# Patient Record
Sex: Male | Born: 1950 | Race: Black or African American | Hispanic: No | Marital: Married | Smoking: Former smoker
Health system: Southern US, Community
[De-identification: ages and names within clinical notes are randomized; demographics above are authoritative.]

## PROBLEM LIST (undated history)

## (undated) ENCOUNTER — Ambulatory Visit: Payer: Medicare Other | Source: Home / Self Care

## (undated) DIAGNOSIS — N400 Enlarged prostate without lower urinary tract symptoms: Secondary | ICD-10-CM

## (undated) DIAGNOSIS — K219 Gastro-esophageal reflux disease without esophagitis: Secondary | ICD-10-CM

## (undated) DIAGNOSIS — I10 Essential (primary) hypertension: Secondary | ICD-10-CM

## (undated) DIAGNOSIS — J302 Other seasonal allergic rhinitis: Secondary | ICD-10-CM

## (undated) DIAGNOSIS — M1990 Unspecified osteoarthritis, unspecified site: Secondary | ICD-10-CM

## (undated) HISTORY — PX: OTHER SURGICAL HISTORY: SHX169

## (undated) HISTORY — PX: COLONOSCOPY: SHX174

---

## 1998-10-05 ENCOUNTER — Ambulatory Visit (HOSPITAL_COMMUNITY): Admission: RE | Admit: 1998-10-05 | Discharge: 1998-10-05 | Payer: Self-pay | Admitting: Gastroenterology

## 2000-06-06 ENCOUNTER — Ambulatory Visit (HOSPITAL_COMMUNITY): Admission: RE | Admit: 2000-06-06 | Discharge: 2000-06-06 | Payer: Self-pay | Admitting: Gastroenterology

## 2003-04-12 HISTORY — PX: JOINT REPLACEMENT: SHX530

## 2003-06-10 ENCOUNTER — Inpatient Hospital Stay (HOSPITAL_COMMUNITY): Admission: RE | Admit: 2003-06-10 | Discharge: 2003-06-14 | Payer: Self-pay | Admitting: Orthopedic Surgery

## 2003-11-11 ENCOUNTER — Encounter: Admission: RE | Admit: 2003-11-11 | Discharge: 2003-11-11 | Payer: Self-pay | Admitting: Internal Medicine

## 2004-05-04 ENCOUNTER — Ambulatory Visit (HOSPITAL_COMMUNITY): Admission: RE | Admit: 2004-05-04 | Discharge: 2004-05-04 | Payer: Self-pay | Admitting: Gastroenterology

## 2005-04-20 ENCOUNTER — Encounter: Admission: RE | Admit: 2005-04-20 | Discharge: 2005-04-20 | Payer: Self-pay | Admitting: Orthopedic Surgery

## 2007-01-28 ENCOUNTER — Emergency Department (HOSPITAL_COMMUNITY): Admission: EM | Admit: 2007-01-28 | Discharge: 2007-01-29 | Payer: Self-pay | Admitting: Emergency Medicine

## 2007-08-03 ENCOUNTER — Encounter: Admission: RE | Admit: 2007-08-03 | Discharge: 2007-08-03 | Payer: Self-pay | Admitting: Gastroenterology

## 2008-10-09 HISTORY — PX: BACK SURGERY: SHX140

## 2008-11-04 ENCOUNTER — Inpatient Hospital Stay (HOSPITAL_COMMUNITY): Admission: RE | Admit: 2008-11-04 | Discharge: 2008-11-06 | Payer: Self-pay | Admitting: Neurosurgery

## 2010-07-18 LAB — CBC
Platelets: 186 10*3/uL (ref 150–400)
RDW: 13.3 % (ref 11.5–15.5)

## 2010-07-18 LAB — BASIC METABOLIC PANEL
BUN: 19 mg/dL (ref 6–23)
Calcium: 9.5 mg/dL (ref 8.4–10.5)
Creatinine, Ser: 0.91 mg/dL (ref 0.4–1.5)
GFR calc non Af Amer: >60 mL/min (ref >60)
Glucose, Bld: 109 mg/dL — ABNORMAL HIGH (ref 70–99)

## 2010-07-26 ENCOUNTER — Ambulatory Visit: Payer: BLUE CROSS/BLUE SHIELD | Admitting: Physical Medicine & Rehabilitation

## 2010-07-27 ENCOUNTER — Ambulatory Visit: Payer: Self-pay | Admitting: Physical Medicine & Rehabilitation

## 2010-08-24 NOTE — Op Note (Signed)
NAMEMAKAIO, MACH NO.:  [id number]   MEDICAL RECORD NO.:  [id number]          PATIENT TYPE:  INP   LOCATION:  3172                         FACILITY:  [facility]   PHYSICIAN:  Danae Orleans. Venetia Maxon, M.D.  DATE OF BIRTH:  May 17, 1950   DATE OF PROCEDURE:  11/04/2008  DATE OF DISCHARGE:                               OPERATIVE REPORT   PREOPERATIVE DIAGNOSES:  Bilateral L3-L4, L4-L5, and L5-S1 stenosis with  spondylolisthesis of L4-L5, spondylosis, degenerative disk disease, and  radiculopathy.   POSTOPERATIVE DIAGNOSES:  Bilateral L3-L4, L4-L5, and L5-S1 stenosis  with spondylolisthesis of L4-L5, spondylosis, degenerative disk disease,  and radiculopathy.   PROCEDURES:  1. Bilateral laminar foraminotomies L3-L4, L4-L5, and L5-S1.  2. Microdissection.  3. Baxano decompression using intraoperative fluoroscopy.   SURGEON:  Danae Orleans. Venetia Maxon, MD   ASSISTANT:  Clydene Fake, MD   ANESTHESIA:  General endotracheal anesthesia.   ESTIMATED BLOOD LOSS:  300 mL.   COMPLICATIONS:  None.   DISPOSITION:  Recovery.   INDICATIONS:  Daschel Roughton is a 60 year old man who works for [employer] has  significant bilateral foraminal stenosis at L3-L4, L4-L5, and L5-S1  levels with spondylolisthesis of L4-L5.  It was elected to take him to  Surgery for decompression at these affected levels.   PROCEDURE IN DETAIL:  Mr. Womac was brought to the operating room.  Following satisfactory and uncomplicated induction of general  endotracheal anesthesia and placement of intravenous lines and Foley  catheter, the patient was placed in a prone position on the operating  table after neural monitoring was established over the L2-L3 dermatomes  for EMG monitoring.  The patient's back was shaved and prepped and  draped in the usual sterile fashion.  The area of planned incision was  infiltrated with local lidocaine.  Incision was made, carried to the  lumbodorsal fascia which was incised  bilaterally sharply and  subperiosteal dissection was performed exposing L3-L4, L4-L5, and L5-S1  interlaminar spaces.  Intraoperative x-ray with fluoroscopy confirmed  correct level.  A Versa-Trac retractor was placed to facilitate  exposure.  Soft tissues were cleared overlying the laminae of L4, L5,  and L3 and bilateral foraminotomies were performed at L3-L4, L4-L5, and  L5-S1.  Subsequently using the Baxano decompression technique with iO-  Flex, a disk level pass was performed at L4-L5 and L5-S1 on the left and  shaving was performed to open this level.  Approximately 25  versifications were performed with good decompression.  An attempt was  made to perform decompression of the L3-L4 level but with the neural  monitoring there was neural irritability and it was elected to stop.  At  the other levels, there was good neural monitoring and I went ahead with  decompression.  At the L4-L5 level on the right, I had no difficulty  passing the probe with good monitoring results and a 10-mm shaver was  utilized.  On the right side at L3-L4, there was neural irritability and  again it was elected to discontinue the Baxano decompression at this  level.  At L5-S1 level, I did not get nerve  root identification either  with stimulation above or below and it was therefore elected to stop the  decompression procedure.  Microscope was brought into the field and all  neural elements were inspected and felt to be well decompressed and the  wound was then irrigated.  Self-retaining retractors were removed.  The  lumbodorsal fascia was closed with 0-Vicryl sutures, subcutaneous  tissues were reapproximated with 2-0 Vicryl interrupted inverted  sutures, and the skin edges were reapproximated with interrupted 3-0  Vicryl subcuticular stitch.  Wound was dressed with Dermabond.  The  patient was extubated in the operating room and taken to the recovery  room in stable and satisfactory condition having  tolerated his operation  well.  Counts were correct at the end of case.      Danae Orleans. Venetia Maxon, M.D.  Electronically Signed     JDS/MEDQ  D:  11/04/2008  T:  11/05/2008  Job:  [id number]

## 2010-08-27 NOTE — Procedures (Signed)
[place]  Patient:    Raymond Graham, Raymond Graham                       MRN: [id number] Proc. Date: 06/06/00 Adm. Date:  [id number] Attending:  Louie Bun                           Procedure Report  PROCEDURE:  Esophagogastroduodenoscopy.  INDICATION FOR PROCEDURE:  Black, heme-positive stools.  DESCRIPTION OF PROCEDURE:  The patient was placed in the left lateral decubitus position and placed on the pulse monitor with continuous low-flow oxygen delivered by nasal cannula.  He was sedated with 40 mg IV Demerol and 4 mg IV Versed.  The Olympus video endoscope was advanced under direct vision into the oropharynx and esophagus.  The esophagus was straight and of normal caliber.  The squamocolumnar line was at 36 cm.  There was a widely patent lower esophageal ring and a 2 cm hiatal hernia distal to it.  There was no other abnormality of the distal esophagus or GE junction.  The stomach was entered, and a small amount of liquid secretions was suctioned from the fundus.  Retroflexed view of the cardia confirmed the hiatal hernia and was otherwise unremarkable.  The fundus, body, and antrum appeared normal.  The pylorus was somewhat dilated and slightly deformed, and there was a clean-based 6-7 mm ulcer at the pyloric inlet.  There was no visible vessel or other stigma of hemorrhage associated with this.  The duodenal bulb appeared normal, as did the postbulbar duodenum.  The scope was withdrawn back into the stomach and a CLOtest obtained.  The scope was then withdrawn and the patient returned to the recovery room in stable condition.  He tolerated the procedure well, and there were no immediate complications.  IMPRESSION:  Pyloric channel ulcer with no current stigma of hemorrhage.  PLAN:  Treat with proton pump inhibitor and await CLOtest and treat for eradication of Helicobacter if positive. DD:  06/06/00 TD:  06/06/00 Job: [id number]

## 2010-08-27 NOTE — Op Note (Signed)
NAME:  Raymond Graham, Raymond Graham              ACCOUNT NO.:  [id number]   MEDICAL RECORD NO.:  [id number]          PATIENT TYPE:  AMB   LOCATION:  ENDO                         FACILITY[facility]   PHYSICIAN:  John C. Madilyn Fireman, M.D.    DATE OF BIRTH:  06/04/1950   DATE OF PROCEDURE:  05/04/2004  DATE OF DISCHARGE:                                 OPERATIVE REPORT   PROCEDURE:  Colonoscopy.   SURGEON:   INDICATIONS FOR PROCEDURE:  History of adenomatous colon polyps.   DESCRIPTION OF PROCEDURE:  The patient was placed in the left lateral  decubitus position and placed on the pulse monitor with continuous low flow  oxygen delivered by nasal cannula.  He was sedated with 62.5 mcg IV Fentanyl  and 7 mg IV Versed.  The Olympus video colonoscope was into the rectum and  advanced to the cecum, confirmed by transillumination of McBurney's point  and visualization of the ileocecal valve and appendiceal orifice.  The prep  was good.  The cecum, ascending and transverse colon all appeared normal  with no masses, polyps, diverticula or other mucosal abnormalities.  Within  the descending and sigmoid colon, there was seen a few scattered diverticula  and no other abnormalities.  The rectum appeared normal.  On retroflexed  view, the anus revealed no obvious internal hemorrhoids.  Scope was then  withdrawn and the patient returned to the recovery room in stable condition.  He tolerated the procedure well and there were no immediate complications.   IMPRESSION:  Diverticulosis.  Otherwise normal study.   PLAN:  Repeat colonoscopy in five years.      JCH/MEDQ  D:  05/04/2004  T:  05/04/2004  Job:  [id number]   cc:   Windle Guard, M.D.  [address]  [place], [place] [address]  Fax: [phone]

## 2010-08-27 NOTE — H&P (Signed)
NAME:  Raymond Graham, Raymond Graham                  ACCOUNT NO.:  [id number]   MEDICAL RECORD NO.:  [id number]                   PATIENT TYPE:  INP   LOCATION:  NA                                   FACILITY:  [facility]   PHYSICIAN:  Madlyn Frankel. Charlann Boxer, M.D.               DATE OF BIRTH:  1951-03-11   DATE OF ADMISSION:  06/10/2003  DATE OF DISCHARGE:                                HISTORY & PHYSICAL   CHIEF COMPLAINT:  Right hip pain.   HISTORY OF PRESENT ILLNESS:  The patient is a 60 year old male who has about  a 1-1/2 year history of right hip pain.  He states that he has had an  increased amount of pain when sitting for a long period, and states that it  is hard to get up.  He also is complaining of some groin pain in the right  hip.  Upon reviewing the x-rays, x-rays revealed bone-on-bone arthritis of  the right hip with cystic changes and osteophyte formation.  With the  radiographic evidence, Dr. Charlann Boxer feels it is best to proceed with a right  total hip arthroplasty.  The patient agrees.  Risks and benefits of the  surgery have been discussed with the patient, and the patient wishes to  proceed.   PAST MEDICAL HISTORY:  1. Hypertension.  2. Gastroesophageal reflux disease.   PAST SURGICAL HISTORY:  1. Ganglion cyst removal, left wrist.  2. Bunion removal, left foot.   MEDICATIONS:  1. Prevacid 30 mg one p.o. daily.  2. Ziac 10/6.25 mg one p.o. daily.  3. Celebrex 400 mg one p.o. daily which he has been instructed to stop     taking prior to surgery.  4. Lisinopril 10 mg one p.o. daily.  5. Clarinex p.r.n.   ALLERGIES:  No known drug allergies.   SOCIAL HISTORY:  The patient denies any tobacco or alcohol use.  He lives in  a [place] house with 16 steps in the house.  He is married.   FAMILY HISTORY:  Mother with hypertension.  Father with colon cancer.   REVIEW OF SYSTEMS:  GENERAL:  Denies fever, chills, night sweats, bleeding  tendencies.  CNS:  Denies blurry or double  vision, seizures, headaches,  paralysis.  RESPIRATORY:  Denies shortness of breath, productive cough,  hemoptysis.  CARDIOVASCULAR:  Denies chest pain, angina, orthopnea.  GASTROINTESTINAL:  Denies nausea, vomiting, diarrhea, constipation, bloody  stools.  GENITOURINARY:  Denies dysuria, hematuria, or discharge.  MUSCULOSKELETAL:  Pertinent to history of present illness.   PHYSICAL EXAMINATION:  VITAL SIGNS:  Blood pressure 130/92, pulse 68,  respirations 12.  GENERAL:  Well-developed, well-nourished 60 year old male.  HEENT:  Normocephalic and atraumatic.  Pupils equal, round, and reactive to  light.  NECK:  Supple.  No carotid bruit noted.  CHEST:  Clear to auscultation bilaterally.  No wheezes or crackles.  HEART:  Regular rate and rhythm.  No murmurs, rubs, or gallops.  ABDOMEN:  Soft,  nontender, nondistended.  Positive bowel sounds x4.  EXTREMITIES:  Painful range of motion of the right hip.  He has 0 degrees of  internal rotation, 40 degrees of external rotation.  He is neurovascularly  intact distally.  SKIN:  No rashes or lesions.   X-ray reveals bone-on-bone osteoarthritis with cystic changes and osteophyte  formation of the right hip.   IMPRESSION:  1. Osteoarthritis of the right hip.  2. Hypertension.  3. Gastroesophageal reflux disease.   PLAN:  The patient will be admitted to [facility] on June 10, 2003, and will undergo a right total hip arthroplasty by Dr. Durene Romans.     Clarene Reamer, P.A.-C.                   Madlyn Frankel Charlann Boxer, M.D.    SW/MEDQ  D:  06/09/2003  T:  06/10/2003  Job:  [id number]

## 2010-08-27 NOTE — Op Note (Signed)
NAME:  DHRUVA, ORNDOFF                  ACCOUNT NO.:  [id number]   MEDICAL RECORD NO.:  [id number]                   PATIENT TYPE:  INP   LOCATION:  0005                                 FACILITY:  [facility]   PHYSICIAN:  Madlyn Frankel. Charlann Boxer, M.D.               DATE OF BIRTH:  11/07/1950   DATE OF PROCEDURE:  06/10/2003  DATE OF DISCHARGE:                                 OPERATIVE REPORT   PREOPERATIVE DIAGNOSES:  End-stage right hip osteoarthritis.   POSTOPERATIVE DIAGNOSES:  End-stage right hip osteoarthritis.   PROCEDURE:  Right total hip replacement.   COMPONENTS USED:  Depuy Esrom 18 x 13, 36 plus 8 stem, 49F XXL sleeve, 36  plus 3 metal ball, 54 mm pinnacle resector cup, two cancellous bone screws  and a neutral metal liner.   SURGEON:  Madlyn Frankel. Charlann Boxer, M.D.   ASSISTANT:  Clarene Reamer, P.A.-C.   ANESTHESIA:  General.   ESTIMATED BLOOD LOSS:  700 mL   DRAINS:  Drains x1.   IV FLUIDS:  2 units of FFP for a preoperative INR of 1.7.   COMPLICATIONS:  None apparent.   DISPOSITION:  Stable to recovery room.   INDICATIONS FOR PROCEDURE:  Mr. Bowlby is a pleasant 60 year old black  male with a longstanding history of right hip pain. He had been followed  conservatively over a period of a year and a half and at this point felt  that his symptoms were decreasing his quality of life and the ability to  function normally.  We discussed the risks and benefits of right total hip  replacement and he consents for this procedure.   DESCRIPTION OF PROCEDURE:  The patient was brought to the operative theatre.  Once adequate anesthesia and preoperative antibiotics totaling 1 g of Ancef  was administered, the patient was positioned in the left lateral decubitus  position with the right side up.  A curvilinear lateral based incision was  made for posterior approach to the hip.  Sharp dissection was taken down to  the iliotibial band and gluteus fascia which was incised in line with  the  incision.  Sharp dissection was carried down with Bovie and short external  rotators taken down separate from the posterior capsule.  Capsulotomy was  made, hip dislocated and neck osteotomy made based off of anatomical  landmarks and preoperative templating.  Following this, attention was  directed to the acetabulum. Following exposure, reaming commenced with a 47  reamer and was carried down to the medial wall.  Subsequent reaming was  carried up to a 53 reamer with excellent fit. A final 54 cup was then  impacted with excellent Press-Fit through and two cancellous bone screws  were placed. The cup's position was noted to be about 40-45 degrees of  abduction in this male with about 20 degrees of forward flexion which  appeared to be anatomic within a couple millimeters of the anterior rim.  Following this, the final  36 neutral metal liner was impacted into position.  At this point, attention was directed to the femoral preparation.   The femur was prepared per Esrom protocol, straight reaming was carried up  to a 13 mm with a 13.5 mm passed 3/4 of the way down.  Proximal reaming was  carried up to an 69F followed by milling to an XXL.  Trial reduction was  carried out.  Note that the trial sleeve was placed in about 10 degrees of  anteversion compared to the tibia.  An additional 20 degrees was added in  for trial reduction and given a combined forward anteversion of the femur  about 30 degrees. Trial reduction was carried out initially with a 36 mm  ball. With this amount of anteversion, the hip was stable to 80 degrees of  flexion and neutral abduction, internal rotation to about 80 degrees was  stable in sleep position.  There was a slight rock in the component with  internal rotation.  For this reason, we trialed a 36 plus 3 ball. The hip  seemed to be more stable. Leg lengths did not appear to be affected with  this. The patient is noted to have a significant bilateral genu  verum and a  moderate amount of __________left hip.  A decision was made to keep the 36  plus 3 ball in order to provide the stability.  Anterior superior capsular  tissue which could result in some impingement as well as some tightness  anteriorly was debrided. The hip remained stable. The combined anteversion  was noted to be about 45 degrees.  There was no evidence of impingement on  external rotation.  The patient's hip was tightened in extension but was  noted to be that way preoperatively secondary to the degenerative changes.   Following preparation, the femur and trial components removed, final  components were brought to the field. The 69F XXL sleeve was impacted into  position at the level of the neck cut followed by placement of the final 18,  13, 36 plus 8 stem with 20 degrees of anteversion.  The final 36 plus 8 ball  was then impacted onto a dried trunnions and the hip reduced. The hip was  stable.  The posterior capsular tissues that were saved were then  reapproximated to the posterior trochanter through drill holes.  The hip was  copiously irrigated with normal saline solution with antibiotic solution  throughout the case and at this point as well.  A medium Hemovac drain was  placed deep.  The iliotibial band was reapproximated using #1 Ethibond. The  gluteus fascia was reapproximated using a #1 Vicryl.  The remainder of the  wound was closed in layers.  The hip wound was then cleaned, dried and  dressed sterilely with Steri-Strips, dressing, sponge and tape. The patient  tolerated the procedure without complications and was extubated and  transported to the recovery room in stable condition.                                               Madlyn Frankel Charlann Boxer, M.D.    MDO/MEDQ  D:  06/10/2003  T:  06/10/2003  Job:  [id number]

## 2011-01-19 LAB — BASIC METABOLIC PANEL
GFR calc non Af Amer: >60
Potassium: 3.2 — ABNORMAL LOW
Sodium: 136

## 2011-01-19 LAB — DIFFERENTIAL
Eosinophils Relative: 0
Lymphocytes Relative: 9 — ABNORMAL LOW
Lymphs Abs: 0.8
Monocytes Absolute: 0.4
Neutro Abs: 8.1 — ABNORMAL HIGH

## 2011-01-19 LAB — POCT CARDIAC MARKERS
CKMB, poc: 1.8
Operator id: 2725
Operator id: 2725
Troponin i, poc: <0.05
Troponin i, poc: <0.05

## 2011-01-19 LAB — CBC
HCT: 43.3
Hemoglobin: 14.8
Platelets: 210
WBC: 9.5

## 2011-05-23 ENCOUNTER — Encounter (HOSPITAL_COMMUNITY): Payer: Self-pay | Admitting: Pharmacy Technician

## 2011-05-25 ENCOUNTER — Encounter (HOSPITAL_COMMUNITY)
Admission: RE | Admit: 2011-05-25 | Discharge: 2011-05-25 | Disposition: A | Discharge status: Home / Self Care | Payer: BC Managed Care – PPO | Source: Ambulatory Visit | Attending: Orthopedic Surgery | Admitting: Orthopedic Surgery

## 2011-05-25 ENCOUNTER — Encounter (HOSPITAL_COMMUNITY): Payer: Self-pay

## 2011-05-25 ENCOUNTER — Ambulatory Visit (HOSPITAL_COMMUNITY)
Admission: RE | Admit: 2011-05-25 | Discharge: 2011-05-25 | Disposition: A | Discharge status: Home / Self Care | Payer: BC Managed Care – PPO | Source: Ambulatory Visit | Attending: Orthopedic Surgery | Admitting: Orthopedic Surgery

## 2011-05-25 HISTORY — DX: Other seasonal allergic rhinitis: J30.2

## 2011-05-25 HISTORY — DX: Unspecified osteoarthritis, unspecified site: M19.90

## 2011-05-25 HISTORY — DX: Essential (primary) hypertension: I10

## 2011-05-25 HISTORY — DX: Benign prostatic hyperplasia without lower urinary tract symptoms: N40.0

## 2011-05-25 HISTORY — DX: Gastro-esophageal reflux disease without esophagitis: K21.9

## 2011-05-25 LAB — URINALYSIS, ROUTINE W REFLEX MICROSCOPIC
Glucose, UA: NEGATIVE mg/dL
Ketones, ur: NEGATIVE mg/dL
Leukocytes, UA: NEGATIVE
Nitrite: NEGATIVE
Specific Gravity, Urine: 1.021 (ref 1.005–1.030)
pH: 6 (ref 5.0–8.0)

## 2011-05-25 LAB — DIFFERENTIAL
Basophils Relative: 0 % (ref 0–1)
Eosinophils Absolute: 0.1 10*3/uL (ref 0.0–0.7)
Lymphs Abs: 1.3 10*3/uL (ref 0.7–4.0)
Monocytes Absolute: 0.7 10*3/uL (ref 0.1–1.0)
Monocytes Relative: 11 % (ref 3–12)

## 2011-05-25 LAB — CBC
HCT: 43.2 % (ref 39.0–52.0)
Hemoglobin: 15 g/dL (ref 13.0–17.0)
MCH: 29.6 pg (ref 26.0–34.0)
MCHC: 34.7 g/dL (ref 30.0–36.0)
MCV: 85.2 fL (ref 78.0–100.0)

## 2011-05-25 LAB — BASIC METABOLIC PANEL
BUN: 12 mg/dL (ref 6–23)
Chloride: 99 mEq/L (ref 96–112)
Creatinine, Ser: 0.96 mg/dL (ref 0.50–1.35)
GFR calc Af Amer: >90 mL/min (ref >90)
GFR calc non Af Amer: 87 mL/min — ABNORMAL LOW (ref >90)
Glucose, Bld: 108 mg/dL — ABNORMAL HIGH (ref 70–99)

## 2011-05-25 NOTE — Progress Notes (Signed)
H&P performed 05/25/11 Dictation # [id number]

## 2011-05-25 NOTE — Patient Instructions (Signed)
20 Raymond Graham  05/25/2011   Your procedure is scheduled on:  Tuesday 2/19  AT 11:45 AM  Report to [facility] at 9:30 AM.  Call this number if you have problems the morning of surgery: [phone]   Remember:   Do not eat food OF DRINK ANYTHING AFTER MIDNIGHT THE NIGHT BEFORE YOUR SURGERY.    Take these medicines the morning of surgery with A SIP OF WATER: METOPROLOL, TAMSULOSIN     -  MAY USE YOUR NASONEX   Do not wear jewelry  Do not wear lotions    Do not bring valuables to the hospital.  Contacts, dentures or bridgework may not be worn into surgery.  Leave suitcase in the car. After surgery it may be brought to your room.  For patients admitted to the hospital, checkout time is 11:00 AM the day of discharge.   Patients discharged the day of surgery will not be allowed to drive home.    Special Instructions: CHG Shower Use Special Wash: 1/2 bottle night before surgery and 1/2 bottle morning of surgery.   Please read over the following fact sheets that you were given: Blood Transfusion Information and MRSA Information AND INCENTIVE SPIROMETER INFORMATION

## 2011-05-25 NOTE — Pre-Procedure Instructions (Signed)
MEDICAL CLEARANCE ON CHART FROM DR. SANDERS. TREADMILL STRESS TEST REPORT WITH EKG 07/07/10,  AND EKG REPORT 05/13/11 AND CARDIAC CLEARANCE AND OFFICE NOTES FROM DR. GANJI ON THIS CHART. CXR WAS DONE TODAY PREOP AT [facility] PER ANESTHESIOLOGIST'S GUIDELINES.

## 2011-05-26 NOTE — H&P (Signed)
[name], Graham NO.:  [id number]  MEDICAL RECORD NO.:  [id number]  LOCATION:  PADM                         FACILITY[facility]  [facility]  PHYSICIAN:  Raymond Frankel. Charlann Graham, M.D.  DATE OF BIRTH:  1950-07-17  DATE OF ADMISSION:  05/25/2011 DATE OF DISCHARGE:  05/25/2011                             HISTORY & PHYSICAL   DATE OF SURGERY:  May 31, 2011.  ADMITTING DIAGNOSIS:  Left hip osteoarthritis.  HISTORY OF PRESENT ILLNESS:  This is a 61 year old gentleman with a history of osteoarthritis of his left hip that has failed conservative management.  He has had a previous right total hip arthroplasty in 2005, done quite well with it and is now scheduled for a total hip arthroplasty of the left hip by anterior approach.  The surgery, risks, benefits, and aftercare were discussed in detail with the patient, questions invited and answered.  Note, his medical doctor is Dr. Dorothyann Graham.  He is a candidate for tranexamic acid and dexamethasone, and will receive those in the preop.  He does plan on going home after surgery.  He is given his home medications of aspirin, Robaxin, iron, MiraLax, and Colace today.  PAST MEDICAL HISTORY:  Drug allergies, none.  Serious medical illnesses include hypertension, BPH, and allergies.  PREVIOUS SURGERIES:  Include right total hip arthroplasty, left wrist ganglion cyst removal, left foot bunionectomy, and spinal stenosis surgery for lumbar spinal stenosis by Dr. Venetia Graham in 2010.  CURRENT MEDICATIONS: 1. Losartan and hydrochlorothiazide 50/12.5 one  daily. 2. Metoprolol 100 mg 1 daily. 3. Tamsulosin 0.4 mg 1 daily. 4. Nasonex spray 50 mcg 2 sprays daily.  FAMILY HISTORY:  Positive for cancer and liver disease.  SOCIAL HISTORY:  The patient is married, he is a retired Loss adjuster, chartered.  He used to smoke, but does not any more; and drinks on occasion, but not daily.  He again plans to go home after surgery.  REVIEW OF SYSTEMS:  CENTRAL  NERVOUS SYSTEM:  Negative for headache, blurred vision, or dizziness.  PULMONARY:  Negative for shortness breath, PND, or orthopnea.  CARDIOVASCULAR:  Negative for chest pain or palpitation.  GI:  Negative for ulcers or hepatitis.  GU:  Negative for urinary tract difficulties other than BPH.  MUSCULOSKELETAL:  Positive in HPI.  PHYSICAL EXAMINATION:  VITAL SIGNS:  BP 145/90, respirations 14, pulse 68 and regular. GENERAL APPEARANCE:  This is a well-developed, well-nourished gentleman in no acute distress.  HEENT.  Head normocephalic.  Nose patent.  Ears patent.  Pupils are equal, round, react to light.  Throat without injection. NECK:  Supple without adenopathy.  Carotids are 2+ without bruit. CHEST:  Clear to auscultation.  No rales or rhonchi.  Respirations 14. HEART:  Regular rate and rhythm at 68 beats per minute without murmur. ABDOMEN:  Soft.  Active bowel sounds.  No masses or organomegaly. NEUROLOGIC:  The patient alert and oriented to time place, person. Cranial nerves II-XII grossly intact. EXTREMITIES:  Show the left hip to have markedly diminished range of motion with full extension and flexion to about 95 degrees, external rotation of 15 degrees, internal rotation to 0.  He also has significant varus deformity to his  left knee with 5 degrees from full extension, further flexion to 115 degrees.  Neurovascular status intact.  Dorsalis pedis, posterior tibialis pulses are 2+.  IMPRESSION:  End-stage osteoarthritis, left hip.  PLAN:  Total hip arthroplasty by anterior approach, left hip.     Raymond Graham, P.A.   ______________________________ Raymond Graham, M.D.    SJC/MEDQ  D:  05/25/2011  T:  05/26/2011  Job:  [id number]

## 2011-05-26 NOTE — Pre-Procedure Instructions (Signed)
CONNIE AT DR. Jeannetta Ellis OFFICE NOTIFIED PT HAS ABNORMAL LABS-PLEASE ASK DR. OLIN TO REVIEW THE ABNORMALS IN EPIC.

## 2011-05-31 ENCOUNTER — Inpatient Hospital Stay (HOSPITAL_COMMUNITY): Payer: BC Managed Care – PPO

## 2011-05-31 ENCOUNTER — Encounter (HOSPITAL_COMMUNITY): Payer: Self-pay | Admitting: Anesthesiology

## 2011-05-31 ENCOUNTER — Inpatient Hospital Stay (HOSPITAL_COMMUNITY)
Admission: RE | Admit: 2011-05-31 | Discharge: 2011-06-02 | DRG: 818 | Disposition: A | Discharge status: Home Health | Payer: BC Managed Care – PPO | Source: Ambulatory Visit | Attending: Orthopedic Surgery | Admitting: Orthopedic Surgery

## 2011-05-31 ENCOUNTER — Inpatient Hospital Stay (HOSPITAL_COMMUNITY): Payer: BC Managed Care – PPO | Admitting: Anesthesiology

## 2011-05-31 ENCOUNTER — Encounter (HOSPITAL_COMMUNITY)
Admission: RE | Disposition: A | Discharge status: Home Health | Payer: Self-pay | Source: Ambulatory Visit | Attending: Orthopedic Surgery

## 2011-05-31 ENCOUNTER — Encounter (HOSPITAL_COMMUNITY): Payer: Self-pay

## 2011-05-31 DIAGNOSIS — Z96649 Presence of unspecified artificial hip joint: Secondary | ICD-10-CM

## 2011-05-31 DIAGNOSIS — M169 Osteoarthritis of hip, unspecified: Principal | ICD-10-CM | POA: Diagnosis present

## 2011-05-31 DIAGNOSIS — M1610 Unilateral primary osteoarthritis, unspecified hip: Principal | ICD-10-CM | POA: Diagnosis present

## 2011-05-31 DIAGNOSIS — N400 Enlarged prostate without lower urinary tract symptoms: Secondary | ICD-10-CM | POA: Diagnosis present

## 2011-05-31 DIAGNOSIS — I10 Essential (primary) hypertension: Secondary | ICD-10-CM | POA: Diagnosis present

## 2011-05-31 HISTORY — PX: TOTAL HIP ARTHROPLASTY: SHX124

## 2011-05-31 LAB — TYPE AND SCREEN: Antibody Screen: NEGATIVE

## 2011-05-31 LAB — ABO/RH: ABO/RH(D): AB POS

## 2011-05-31 LAB — PROTIME-INR: Prothrombin Time: 20.5 seconds — ABNORMAL HIGH (ref 11.6–15.2)

## 2011-05-31 SURGERY — ARTHROPLASTY, HIP, TOTAL, ANTERIOR APPROACH
Anesthesia: General | Site: Hip | Laterality: Left | Wound class: Clean

## 2011-05-31 MED ORDER — ONDANSETRON HCL 4 MG PO TABS: 4.0000 mg | ORAL_TABLET | Freq: Four times a day (QID) | ORAL | Status: DC | PRN

## 2011-05-31 MED ORDER — ONDANSETRON HCL 4 MG/2ML IJ SOLN
INTRAMUSCULAR | Status: DC | PRN
  Administered 2011-05-31: 4 mg via INTRAVENOUS

## 2011-05-31 MED ORDER — POTASSIUM CHLORIDE 2 MEQ/ML IV SOLN
INTRAVENOUS | Status: DC
  Administered 2011-05-31 – 2011-06-01 (×2): via INTRAVENOUS
  Filled 2011-05-31 (×6): qty 1000

## 2011-05-31 MED ORDER — CHLORHEXIDINE GLUCONATE 4 % EX LIQD: 60.0000 mL | Freq: Once | CUTANEOUS | Status: DC

## 2011-05-31 MED ORDER — PHENOL 1.4 % MT LIQD
1.0000 | OROMUCOSAL | Status: DC | PRN
  Filled 2011-05-31: qty 177

## 2011-05-31 MED ORDER — ONDANSETRON HCL 4 MG/2ML IJ SOLN
4.0000 mg | Freq: Four times a day (QID) | INTRAMUSCULAR | Status: DC | PRN
  Administered 2011-05-31 – 2011-06-01 (×2): 4 mg via INTRAVENOUS
  Filled 2011-05-31 (×2): qty 2

## 2011-05-31 MED ORDER — METOCLOPRAMIDE HCL 5 MG/ML IJ SOLN: 5.0000 mg | Freq: Three times a day (TID) | INTRAMUSCULAR | Status: DC | PRN

## 2011-05-31 MED ORDER — LACTATED RINGERS IV SOLN: INTRAVENOUS | Status: DC

## 2011-05-31 MED ORDER — ROCURONIUM BROMIDE 100 MG/10ML IV SOLN
INTRAVENOUS | Status: DC | PRN
  Administered 2011-05-31: 50 mg via INTRAVENOUS

## 2011-05-31 MED ORDER — TEMAZEPAM 15 MG PO CAPS
15.0000 mg | ORAL_CAPSULE | Freq: Every evening | ORAL | Status: DC | PRN
  Administered 2011-06-01 (×3): 15 mg via ORAL
  Filled 2011-05-31 (×3): qty 1

## 2011-05-31 MED ORDER — FENTANYL CITRATE 0.05 MG/ML IJ SOLN
INTRAMUSCULAR | Status: DC | PRN
  Administered 2011-05-31: 25 ug via INTRAVENOUS
  Administered 2011-05-31 (×5): 50 ug via INTRAVENOUS

## 2011-05-31 MED ORDER — HYDROCHLOROTHIAZIDE 12.5 MG PO CAPS
12.5000 mg | ORAL_CAPSULE | Freq: Every day | ORAL | Status: DC
  Administered 2011-05-31 – 2011-06-02 (×3): 12.5 mg via ORAL
  Filled 2011-05-31 (×3): qty 1

## 2011-05-31 MED ORDER — ACETAMINOPHEN 325 MG PO TABS: 650.0000 mg | ORAL_TABLET | Freq: Four times a day (QID) | ORAL | Status: DC | PRN

## 2011-05-31 MED ORDER — LOSARTAN POTASSIUM 50 MG PO TABS
50.0000 mg | ORAL_TABLET | Freq: Every day | ORAL | Status: DC
  Administered 2011-05-31 – 2011-06-02 (×3): 50 mg via ORAL
  Filled 2011-05-31 (×3): qty 1

## 2011-05-31 MED ORDER — RIVAROXABAN 10 MG PO TABS: 10.0000 mg | ORAL_TABLET | Freq: Every day | ORAL | Status: DC

## 2011-05-31 MED ORDER — METOPROLOL SUCCINATE ER 100 MG PO TB24
100.0000 mg | ORAL_TABLET | Freq: Every day | ORAL | Status: DC
  Administered 2011-06-01 – 2011-06-02 (×2): 100 mg via ORAL
  Filled 2011-05-31 (×2): qty 1

## 2011-05-31 MED ORDER — HYDROMORPHONE HCL PF 1 MG/ML IJ SOLN
0.5000 mg | INTRAMUSCULAR | Status: DC | PRN
  Administered 2011-05-31 – 2011-06-01 (×3): 1 mg via INTRAVENOUS
  Filled 2011-05-31 (×3): qty 1

## 2011-05-31 MED ORDER — FLEET ENEMA 7-19 GM/118ML RE ENEM: 1.0000 | ENEMA | Freq: Once | RECTAL | Status: AC | PRN

## 2011-05-31 MED ORDER — LOSARTAN POTASSIUM-HCTZ 50-12.5 MG PO TABS: 1.0000 | ORAL_TABLET | Freq: Every day | ORAL | Status: DC

## 2011-05-31 MED ORDER — EPHEDRINE SULFATE 50 MG/ML IJ SOLN
INTRAMUSCULAR | Status: DC | PRN
  Administered 2011-05-31: 10 mg via INTRAVENOUS

## 2011-05-31 MED ORDER — MIDAZOLAM HCL 5 MG/5ML IJ SOLN
INTRAMUSCULAR | Status: DC | PRN
  Administered 2011-05-31: 2 mg via INTRAVENOUS

## 2011-05-31 MED ORDER — METOCLOPRAMIDE HCL 10 MG PO TABS: 5.0000 mg | ORAL_TABLET | Freq: Three times a day (TID) | ORAL | Status: DC | PRN

## 2011-05-31 MED ORDER — CEFAZOLIN SODIUM 1-5 GM-% IV SOLN
1.0000 g | Freq: Four times a day (QID) | INTRAVENOUS | Status: AC
  Administered 2011-05-31 – 2011-06-01 (×3): 1 g via INTRAVENOUS
  Filled 2011-05-31 (×3): qty 50

## 2011-05-31 MED ORDER — POLYETHYLENE GLYCOL 3350 17 G PO PACK
17.0000 g | PACK | Freq: Every day | ORAL | Status: DC | PRN
  Filled 2011-05-31: qty 1

## 2011-05-31 MED ORDER — HYDROMORPHONE HCL PF 1 MG/ML IJ SOLN
0.2500 mg | INTRAMUSCULAR | Status: DC | PRN
  Administered 2011-05-31: 0.5 mg via INTRAVENOUS

## 2011-05-31 MED ORDER — DEXAMETHASONE SODIUM PHOSPHATE 10 MG/ML IJ SOLN
10.0000 mg | Freq: Once | INTRAMUSCULAR | Status: AC
  Administered 2011-06-01: 10 mg via INTRAVENOUS
  Filled 2011-05-31: qty 1

## 2011-05-31 MED ORDER — DIPHENHYDRAMINE HCL 12.5 MG/5ML PO ELIX: 12.5000 mg | ORAL_SOLUTION | ORAL | Status: DC | PRN

## 2011-05-31 MED ORDER — HYDROMORPHONE HCL PF 1 MG/ML IJ SOLN
INTRAMUSCULAR | Status: DC | PRN
  Administered 2011-05-31: 0.5 mg via INTRAVENOUS

## 2011-05-31 MED ORDER — FERROUS SULFATE 325 (65 FE) MG PO TABS
325.0000 mg | ORAL_TABLET | Freq: Three times a day (TID) | ORAL | Status: DC
  Administered 2011-06-01 – 2011-06-02 (×5): 325 mg via ORAL
  Filled 2011-05-31 (×7): qty 1

## 2011-05-31 MED ORDER — TRANEXAMIC ACID 100 MG/ML IV SOLN
1370.0000 mg | INTRAVENOUS | Status: AC
  Administered 2011-05-31: 1370 mg via INTRAVENOUS
  Filled 2011-05-31: qty 13.7

## 2011-05-31 MED ORDER — 0.9 % SODIUM CHLORIDE (POUR BTL) OPTIME
TOPICAL | Status: DC | PRN
  Administered 2011-05-31: 1000 mL

## 2011-05-31 MED ORDER — RIVAROXABAN 10 MG PO TABS
10.0000 mg | ORAL_TABLET | Freq: Every day | ORAL | Status: DC
  Administered 2011-06-01 – 2011-06-02 (×2): 10 mg via ORAL
  Filled 2011-05-31 (×2): qty 1

## 2011-05-31 MED ORDER — PROMETHAZINE HCL 25 MG/ML IJ SOLN: 6.2500 mg | INTRAMUSCULAR | Status: DC | PRN

## 2011-05-31 MED ORDER — MENTHOL 3 MG MT LOZG
1.0000 | LOZENGE | OROMUCOSAL | Status: DC | PRN
  Filled 2011-05-31 (×2): qty 9

## 2011-05-31 MED ORDER — PROSIGHT PO TABS
1.0000 | ORAL_TABLET | Freq: Every day | ORAL | Status: DC
  Administered 2011-06-01 – 2011-06-02 (×2): 1 via ORAL
  Filled 2011-05-31 (×3): qty 1

## 2011-05-31 MED ORDER — TAMSULOSIN HCL 0.4 MG PO CAPS
0.4000 mg | ORAL_CAPSULE | Freq: Every day | ORAL | Status: DC
  Administered 2011-06-01 – 2011-06-02 (×2): 0.4 mg via ORAL
  Filled 2011-05-31 (×2): qty 1

## 2011-05-31 MED ORDER — ACETAMINOPHEN 10 MG/ML IV SOLN
INTRAVENOUS | Status: DC | PRN
  Administered 2011-05-31: 1000 mg via INTRAVENOUS

## 2011-05-31 MED ORDER — DEXAMETHASONE SODIUM PHOSPHATE 10 MG/ML IJ SOLN: 10.0000 mg | Freq: Once | INTRAMUSCULAR | Status: DC

## 2011-05-31 MED ORDER — HYDROCODONE-ACETAMINOPHEN 7.5-325 MG PO TABS
1.0000 | ORAL_TABLET | ORAL | Status: DC | PRN
  Administered 2011-05-31 – 2011-06-01 (×4): 1 via ORAL
  Administered 2011-06-01 (×2): 2 via ORAL
  Administered 2011-06-01: 1 via ORAL
  Administered 2011-06-02 (×2): 2 via ORAL
  Filled 2011-05-31: qty 2
  Filled 2011-05-31: qty 1
  Filled 2011-05-31 (×4): qty 2
  Filled 2011-05-31: qty 1
  Filled 2011-05-31: qty 2
  Filled 2011-05-31: qty 1

## 2011-05-31 MED ORDER — PROPOFOL 10 MG/ML IV BOLUS
INTRAVENOUS | Status: DC | PRN
  Administered 2011-05-31: 200 mg via INTRAVENOUS

## 2011-05-31 MED ORDER — ACETAMINOPHEN 650 MG RE SUPP: 650.0000 mg | Freq: Four times a day (QID) | RECTAL | Status: DC | PRN

## 2011-05-31 MED ORDER — METHOCARBAMOL 500 MG PO TABS
500.0000 mg | ORAL_TABLET | Freq: Four times a day (QID) | ORAL | Status: DC | PRN
  Administered 2011-05-31 – 2011-06-01 (×2): 500 mg via ORAL
  Filled 2011-05-31 (×2): qty 1

## 2011-05-31 MED ORDER — ALUM & MAG HYDROXIDE-SIMETH 200-200-20 MG/5ML PO SUSP
30.0000 mL | ORAL | Status: DC | PRN
  Administered 2011-06-01: 30 mL via ORAL
  Filled 2011-05-31: qty 30

## 2011-05-31 MED ORDER — LYCOPENE 10 MG PO CAPS: 1.0000 | ORAL_CAPSULE | Freq: Every day | ORAL | Status: DC

## 2011-05-31 MED ORDER — NEOSTIGMINE METHYLSULFATE 1 MG/ML IJ SOLN
INTRAMUSCULAR | Status: DC | PRN
  Administered 2011-05-31: 4 mg via INTRAVENOUS

## 2011-05-31 MED ORDER — LACTATED RINGERS IV SOLN
INTRAVENOUS | Status: DC | PRN
  Administered 2011-05-31 (×2): via INTRAVENOUS

## 2011-05-31 MED ORDER — GLYCOPYRROLATE 0.2 MG/ML IJ SOLN
INTRAMUSCULAR | Status: DC | PRN
  Administered 2011-05-31: .6 mg via INTRAVENOUS

## 2011-05-31 MED ORDER — DOCUSATE SODIUM 100 MG PO CAPS
100.0000 mg | ORAL_CAPSULE | Freq: Two times a day (BID) | ORAL | Status: DC
  Administered 2011-05-31 – 2011-06-02 (×4): 100 mg via ORAL
  Filled 2011-05-31 (×5): qty 1

## 2011-05-31 MED ORDER — CEFAZOLIN SODIUM-DEXTROSE 2-3 GM-% IV SOLR
2.0000 g | Freq: Once | INTRAVENOUS | Status: AC
  Administered 2011-05-31: 2 g via INTRAVENOUS

## 2011-05-31 MED ORDER — LIDOCAINE HCL (CARDIAC) 20 MG/ML IV SOLN
INTRAVENOUS | Status: DC | PRN
  Administered 2011-05-31: 40 mg via INTRAVENOUS

## 2011-05-31 MED ORDER — METHOCARBAMOL 100 MG/ML IJ SOLN
500.0000 mg | Freq: Four times a day (QID) | INTRAVENOUS | Status: DC | PRN
  Administered 2011-05-31: 500 mg via INTRAVENOUS
  Filled 2011-05-31 (×2): qty 5

## 2011-05-31 MED ORDER — FLUTICASONE PROPIONATE 50 MCG/ACT NA SUSP
1.0000 | Freq: Every day | NASAL | Status: DC | PRN
  Filled 2011-05-31: qty 16

## 2011-05-31 SURGICAL SUPPLY — 41 items
ADH SKN CLS APL DERMABOND .7 (GAUZE/BANDAGES/DRESSINGS) ×1
BAG SPEC THK2 15X12 ZIP CLS (MISCELLANEOUS) ×2
BAG ZIPLOCK 12X15 (MISCELLANEOUS) ×4 IMPLANT
BLADE SAW SGTL 18X1.27X75 (BLADE) ×2 IMPLANT
CELLS DAT CNTRL 66122 CELL SVR (MISCELLANEOUS) IMPLANT
CLOTH BEACON ORANGE TIMEOUT ST (SAFETY) ×2 IMPLANT
DERMABOND ADVANCED (GAUZE/BANDAGES/DRESSINGS) ×1
DERMABOND ADVANCED .7 DNX12 (GAUZE/BANDAGES/DRESSINGS) ×1 IMPLANT
DRAPE C-ARM 42X72 X-RAY (DRAPES) ×2 IMPLANT
DRAPE STERI IOBAN 125X83 (DRAPES) ×2 IMPLANT
DRAPE U-SHAPE 47X51 STRL (DRAPES) ×6 IMPLANT
DRSG AQUACEL AG ADV 3.5X10 (GAUZE/BANDAGES/DRESSINGS) ×2 IMPLANT
DRSG TEGADERM 4X4.75 (GAUZE/BANDAGES/DRESSINGS) ×1 IMPLANT
DURAPREP 26ML APPLICATOR (WOUND CARE) ×2 IMPLANT
ELECT BLADE TIP CTD 4 INCH (ELECTRODE) ×2 IMPLANT
ELECT REM PT RETURN 9FT ADLT (ELECTROSURGICAL) ×2
ELECTRODE REM PT RTRN 9FT ADLT (ELECTROSURGICAL) ×1 IMPLANT
EVACUATOR 1/8 PVC DRAIN (DRAIN) ×1 IMPLANT
FACESHIELD LNG OPTICON STERILE (SAFETY) ×8 IMPLANT
GAUZE SPONGE 2X2 8PLY STRL LF (GAUZE/BANDAGES/DRESSINGS) ×1 IMPLANT
GLOVE BIOGEL PI IND STRL 7.5 (GLOVE) ×1 IMPLANT
GLOVE BIOGEL PI IND STRL 8 (GLOVE) ×1 IMPLANT
GLOVE BIOGEL PI INDICATOR 7.5 (GLOVE) ×1
GLOVE BIOGEL PI INDICATOR 8 (GLOVE) ×1
GLOVE ECLIPSE 8.0 STRL XLNG CF (GLOVE) ×2 IMPLANT
GLOVE ORTHO TXT STRL SZ7.5 (GLOVE) ×4 IMPLANT
GOWN BRE IMP PREV XXLGXLNG (GOWN DISPOSABLE) ×2 IMPLANT
GOWN STRL NON-REIN LRG LVL3 (GOWN DISPOSABLE) ×2 IMPLANT
KIT BASIN OR (CUSTOM PROCEDURE TRAY) ×2 IMPLANT
PACK TOTAL JOINT (CUSTOM PROCEDURE TRAY) ×2 IMPLANT
PADDING CAST COTTON 6X4 STRL (CAST SUPPLIES) ×2 IMPLANT
RETRACTOR WND ALEXIS 18 MED (MISCELLANEOUS) ×1 IMPLANT
RTRCTR WOUND ALEXIS 18CM MED (MISCELLANEOUS)
SPONGE GAUZE 2X2 STER 10/PKG (GAUZE/BANDAGES/DRESSINGS) ×1
SUCTION FRAZIER 12FR DISP (SUCTIONS) ×2 IMPLANT
SUT MNCRL AB 4-0 PS2 18 (SUTURE) ×2 IMPLANT
SUT VIC AB 1 CT1 36 (SUTURE) ×8 IMPLANT
SUT VIC AB 2-0 CT1 27 (SUTURE) ×4
SUT VIC AB 2-0 CT1 TAPERPNT 27 (SUTURE) ×2 IMPLANT
TOWEL OR 17X26 10 PK STRL BLUE (TOWEL DISPOSABLE) ×4 IMPLANT
TRAY FOLEY CATH 14FRSI W/METER (CATHETERS) ×2 IMPLANT

## 2011-05-31 NOTE — Op Note (Signed)
NAME:  Raymond Graham                ACCOUNT NO.: [id number]      MEDICAL RECORD NO.: [id number]      FACILITY:  [facility]      PHYSICIAN:  Durene Romans D  DATE OF BIRTH:  July 08, 1950     DATE OF PROCEDURE:  05/31/2011                                 OPERATIVE REPORT         PREOPERATIVE DIAGNOSIS: Left  hip osteoarthritis.      POSTOPERATIVE DIAGNOSIS:  Left osteoarthritis.      PROCEDURE:  Left total hip replacement through an anterior approach   utilizing DePuy THR system, component size 56mm pinnacle cup, a size 36+4 neutral   Altrex liner, a size 7Hi Tri Lock stem with a 36+1.5 delta ceramic   ball.      SURGEON:  Madlyn Frankel. Charlann Boxer, M.D.      ASSISTANT:  Lanney Gins, PA      ANESTHESIA:  Regional.      SPECIMENS:  None.      COMPLICATIONS:  None.      BLOOD LOSS:  900 cc     DRAINS:  One Hemovac.      INDICATION OF THE PROCEDURE:  Meer Reindl is a 61 y.o. male who had   presented to office for evaluation of left hip pain.  Radiographs revealed   progressive degenerative changes with bone-on-bone   articulation to the  hip joint.  The patient had painful limited range of   motion significantly affecting their overall quality of life.  The patient was failing to    respond to conservative measures, and at this point was ready   to proceed with more definitive measures.  The patient has noted progressive   degenerative changes in his hip, progressive problems and dysfunction   with regarding the hip prior to surgery.  Consent was obtained for   benefit of pain relief.  Specific risk of infection, DVT, component   failure, dislocation, need for revision surgery, as well discussion of   the anterior versus posterior approach were reviewed.  Consent was   obtained for benefit of anterior pain relief through an anterior   approach.      PROCEDURE IN DETAIL:  The patient was brought to operative theater.   Once adequate anesthesia, preoperative  antibiotics, 2 gm Ancef administered.   The patient was positioned supine on the OSI Hanna table.  Once adequate   padding of boney process was carried out, we had predraped out the hip, and  used fluoroscopy to confirm orientation of the pelvis and position.      The left hip was then prepped and draped from proximal iliac crest to   mid thigh with shower curtain technique.      Time-out was performed identifying the patient, planned procedure, and   extremity.     An incision was then made 2 cm distal and lateral to the   anterior superior iliac spine extending over the orientation of the   tensor fascia lata muscle and sharp dissection was carried down to the   fascia of the muscle and protractor placed in the soft tissues.      The fascia was then incised.  The muscle belly was identified and swept   laterally  and retractor placed along the superior neck.  Following   cauterization of the circumflex vessels and removing some pericapsular   fat, a second cobra retractor was placed on the inferior neck.  A third   retractor was placed on the anterior acetabulum after elevating the   anterior rectus.  A L-capsulotomy was along the line of the   superior neck to the trochanteric fossa, then extended proximally and   distally.  Tag sutures were placed and the retractors were then placed   intracapsular.  We then identified the trochanteric fossa and   orientation of my neck cut, confirmed this radiographically   and then made a neck osteotomy with the femur on traction.  The femoral   head was removed without difficulty or complication.  Traction was let   off and retractors were placed posterior and anterior around the   acetabulum.      The labrum and foveal tissue were debrided.  I began reaming with a 50mm   reamer and reamed up to 55mm reamer with good bony bed preparation and a 56   cup was chosen.  The final 56mm Pinnacle cup was then impacted under fluoroscopy  to confirm the  depth of penetration and orientation with respect to   abduction.  A screw was placed followed by the hole eliminator.  The final   36+4 Altrex liner was impacted with good visualized rim fit.  The cup was positioned anatomically within the acetabular portion of the pelvis.      At this point, the femur was rolled at 80 degrees.  Further capsule was   released off the inferior aspect of the femoral neck.  I then   released the superior capsule proximally.  The hook was placed laterally   along the femur and elevated manually and held in position with the bed   hook.  The leg was then extended and adducted with the leg rolled to 100   degrees of external rotation.  Once the proximal femur was fully   exposed, I used a box osteotome to set orientation.  I then began   broaching with the starting chili pepper broach and passed this by hand and then broached up to 7.  With the 7 broach in place I chose a high offset neck and did a trial reduction.  The offset was appropriate, leg lengths   appeared to be equal, confirmed radiographically.   Given these findings, I went ahead and dislocated the hip, repositioned all   retractors and positioned the right hip in the extended and abducted position.  The final 7 Hi  Tri Lock stem was   chosen and it was impacted down to the level of neck cut.  Based on this   and the trial reduction, a 36+1.5 delta ceramic ball was chosen and   impacted onto a clean and dry trunnion, and the hip was reduced.  The   hip had been irrigated throughout the case again at this point.  I did   reapproximate the superior capsular leaflet to the anterior leaflet   using #1 Vicryl, placed a medium Hemovac drain deep.  The fascia of the   tensor fascia lata muscle was then reapproximated using #1 Vicryl.  The   remaining wound was closed with 2-0 Vicryl and running 4-0 Monocryl.   The hip was cleaned, dried, and dressed sterilely using Dermabond and   Aquacel dressing.  Drain  site dressed separately.  She was then  brought   to recovery room in stable condition tolerating the procedure well.    Lanney Gins, PA-C was present for the entirety of the case involved from   preoperative positioning, perioperative retractor management, general   facilitation of the case, as well as primary wound closure as assistant.            Madlyn Frankel Charlann Boxer, M.D.            MDO/MEDQ  D:  02/01/2011  T:  02/01/2011  Job:  [id number]      Electronically Signed by Durene Romans M.D. on 02/07/2011 09:15:38 AM

## 2011-05-31 NOTE — Anesthesia Preprocedure Evaluation (Addendum)
Anesthesia Evaluation  Patient identified by MRN, date of birth, ID band Patient awake    Reviewed: Allergy & Precautions, H&P , NPO status , Patient's Chart, lab work & pertinent test results, reviewed documented beta blocker date and time   Airway Mallampati: II TM Distance: >3 FB Neck ROM: full    Dental No notable dental hx. (+) Teeth Intact and Dental Advisory Given   Pulmonary neg pulmonary ROS,  clear to auscultation  Pulmonary exam normal       Cardiovascular Exercise Tolerance: Good hypertension, On Home Beta Blockers regular Normal    Neuro/Psych Negative Neurological ROS  Negative Psych ROS   GI/Hepatic negative GI ROS, Neg liver ROS, GERD-  Medicated and Controlled,  Endo/Other  Negative Endocrine ROS  Renal/GU negative Renal ROS  Genitourinary negative   Musculoskeletal   Abdominal   Peds  Hematology negative hematology ROS (+)   Anesthesia Other Findings   Reproductive/Obstetrics negative OB ROS                          Anesthesia Physical Anesthesia Plan  ASA: II  Anesthesia Plan: General   Post-op Pain Management:    Induction: Intravenous  Airway Management Planned: Oral ETT  Additional Equipment:   Intra-op Plan:   Post-operative Plan: Extubation in OR  Informed Consent: I have reviewed the patients History and Physical, chart, labs and discussed the procedure including the risks, benefits and alternatives for the proposed anesthesia with the patient or authorized representative who has indicated his/her understanding and acceptance.   Dental Advisory Given  Plan Discussed with: CRNA and Surgeon  Anesthesia Plan Comments:         Anesthesia Quick Evaluation

## 2011-05-31 NOTE — Preoperative (Signed)
Beta Blockers   Reason not to administer Beta Blockers:Not Applicable, pt took home BB this AM

## 2011-05-31 NOTE — Transfer of Care (Signed)
Immediate Anesthesia Transfer of Care Note  Patient: Raymond Graham  Procedure(s) Performed: Procedure(s) (LRB): TOTAL HIP ARTHROPLASTY ANTERIOR APPROACH (Left)  Patient Location: PACU  Anesthesia Type: General  Level of Consciousness: awake, oriented and patient cooperative  Airway & Oxygen Therapy: Patient Spontanous Breathing and Patient connected to face mask oxygen  Post-op Assessment: Report given to PACU RN and Post -op Vital signs reviewed and stable  Post vital signs: Reviewed and stable  Complications: No apparent anesthesia complications

## 2011-05-31 NOTE — Interval H&P Note (Signed)
History and Physical Interval Note:  05/31/2011 6:54 AM  Raymond Graham  has presented today for surgery, with the diagnosis of left hip osteoarthritis  The various methods of treatment have been discussed with the patient and family. After consideration of risks, benefits and other options for treatment, the patient has consented to  Procedure(s) (LRB): LEFT TOTAL HIP ARTHROPLASTY ANTERIOR APPROACH (Left) as a surgical intervention .  The patients' history has been reviewed, patient examined, no change in status, stable for surgery.  I have reviewed the patients' chart and labs.  Questions were answered to the patient's satisfaction.     Shelda Pal

## 2011-05-31 NOTE — H&P (View-Only) (Signed)
NAME:  Raymond Graham, Raymond Graham              ACCOUNT NO.:  [id number]  MEDICAL RECORD NO.:  [id number]  LOCATION:  [facility]                         FACILITY:  [facility]  PHYSICIAN:  Matthew D. Olin, M.D.  DATE OF BIRTH:  09/16/1950  DATE OF ADMISSION:  05/25/2011 DATE OF DISCHARGE:  05/25/2011                             HISTORY & PHYSICAL   DATE OF SURGERY:  May 31, 2011.  ADMITTING DIAGNOSIS:  Left hip osteoarthritis.  HISTORY OF PRESENT ILLNESS:  This is a 60-year-old gentleman with a history of osteoarthritis of his left hip that has failed conservative management.  He has had a previous right total hip arthroplasty in 2005, done quite well with it and is now scheduled for a total hip arthroplasty of the left hip by anterior approach.  The surgery, risks, benefits, and aftercare were discussed in detail with the patient, questions invited and answered.  Note, his medical doctor is Dr. Robyn Sanders.  He is a candidate for tranexamic acid and dexamethasone, and will receive those in the preop.  He does plan on going home after surgery.  He is given his home medications of aspirin, Robaxin, iron, MiraLax, and Colace today.  PAST MEDICAL HISTORY:  Drug allergies, none.  Serious medical illnesses include hypertension, BPH, and allergies.  PREVIOUS SURGERIES:  Include right total hip arthroplasty, left wrist ganglion cyst removal, left foot bunionectomy, and spinal stenosis surgery for lumbar spinal stenosis by Dr. Stern in 2010.  CURRENT MEDICATIONS: 1. Losartan and hydrochlorothiazide 50/12.5 one  daily. 2. Metoprolol 100 mg 1 daily. 3. Tamsulosin 0.4 mg 1 daily. 4. Nasonex spray 50 mcg 2 sprays daily.  FAMILY HISTORY:  Positive for cancer and liver disease.  SOCIAL HISTORY:  The patient is married, he is a retired [employer] driver.  He used to smoke, but does not any more; and drinks on occasion, but not daily.  He again plans to go home after surgery.  REVIEW OF SYSTEMS:  CENTRAL  NERVOUS SYSTEM:  Negative for headache, blurred vision, or dizziness.  PULMONARY:  Negative for shortness breath, PND, or orthopnea.  CARDIOVASCULAR:  Negative for chest pain or palpitation.  GI:  Negative for ulcers or hepatitis.  GU:  Negative for urinary tract difficulties other than BPH.  MUSCULOSKELETAL:  Positive in HPI.  PHYSICAL EXAMINATION:  VITAL SIGNS:  BP 145/90, respirations 14, pulse 68 and regular. GENERAL APPEARANCE:  This is a well-developed, well-nourished gentleman in no acute distress.  HEENT.  Head normocephalic.  Nose patent.  Ears patent.  Pupils are equal, round, react to light.  Throat without injection. NECK:  Supple without adenopathy.  Carotids are 2+ without bruit. CHEST:  Clear to auscultation.  No rales or rhonchi.  Respirations 14. HEART:  Regular rate and rhythm at 68 beats per minute without murmur. ABDOMEN:  Soft.  Active bowel sounds.  No masses or organomegaly. NEUROLOGIC:  The patient alert and oriented to time place, person. Cranial nerves II-XII grossly intact. EXTREMITIES:  Show the left hip to have markedly diminished range of motion with full extension and flexion to about 95 degrees, external rotation of 15 degrees, internal rotation to 0.  He also has significant varus deformity to his   left knee with 5 degrees from full extension, further flexion to 115 degrees.  Neurovascular status intact.  Dorsalis pedis, posterior tibialis pulses are 2+.  IMPRESSION:  End-stage osteoarthritis, left hip.  PLAN:  Total hip arthroplasty by anterior approach, left hip.     Jamesia Linnen J. Chavis Tessler, P.A.   ______________________________ Matthew D. Olin, M.D.    SJC/MEDQ  D:  05/25/2011  T:  05/26/2011  Job:  [id number] 

## 2011-05-31 NOTE — Anesthesia Postprocedure Evaluation (Signed)
  Anesthesia Post-op Note  Patient: Raymond Graham  Procedure(s) Performed: Procedure(s) (LRB): TOTAL HIP ARTHROPLASTY ANTERIOR APPROACH (Left)  Patient Location: PACU  Anesthesia Type: General  Level of Consciousness: awake and alert   Airway and Oxygen Therapy: Patient Spontanous Breathing  Post-op Pain: mild  Post-op Assessment: Post-op Vital signs reviewed, Patient's Cardiovascular Status Stable, Respiratory Function Stable, Patent Airway and No signs of Nausea or vomiting  Post-op Vital Signs: stable  Complications: No apparent anesthesia complications

## 2011-05-31 NOTE — Anesthesia Procedure Notes (Signed)
Procedure Name: Intubation Date/Time: 05/31/2011 12:43 PM Performed by: Randon Goldsmith CATHERINE PAYNE Pre-anesthesia Checklist: Patient identified, Emergency Drugs available, Suction available and Patient being monitored Patient Re-evaluated:Patient Re-evaluated prior to inductionOxygen Delivery Method: Circle System Utilized Preoxygenation: Pre-oxygenation with 100% oxygen Intubation Type: IV induction Ventilation: Mask ventilation without difficulty Grade View: Grade I Number of attempts: 3 Airway Equipment and Method: video-laryngoscopy Placement Confirmation: ETT inserted through vocal cords under direct vision,  positive ETCO2,  CO2 detector and breath sounds checked- equal and bilateral Secured at: 22 cm Tube secured with: Tape Dental Injury: Teeth and Oropharynx as per pre-operative assessment  Difficulty Due To: Difficulty was unanticipated Comments: DVL with mac 4 by CRNA, no view, DVL with mac 4 by MDA no view, DVL by CRNA with glidescope, grade 1 view.

## 2011-06-01 LAB — BASIC METABOLIC PANEL
BUN: 14 mg/dL (ref 6–23)
CO2: 28 mEq/L (ref 19–32)
Chloride: 96 mEq/L (ref 96–112)
GFR calc non Af Amer: 90 mL/min — ABNORMAL LOW (ref >90)
Glucose, Bld: 138 mg/dL — ABNORMAL HIGH (ref 70–99)
Potassium: 4 mEq/L (ref 3.5–5.1)
Sodium: 135 mEq/L (ref 135–145)

## 2011-06-01 LAB — CBC
HCT: 35.6 % — ABNORMAL LOW (ref 39.0–52.0)
Hemoglobin: 12.2 g/dL — ABNORMAL LOW (ref 13.0–17.0)
RBC: 4.16 MIL/uL — ABNORMAL LOW (ref 4.22–5.81)
WBC: 10.5 10*3/uL (ref 4.0–10.5)

## 2011-06-01 MED ORDER — KETOROLAC TROMETHAMINE 15 MG/ML IJ SOLN
15.0000 mg | Freq: Four times a day (QID) | INTRAMUSCULAR | Status: DC
  Administered 2011-06-01 – 2011-06-02 (×5): 15 mg via INTRAVENOUS
  Filled 2011-06-01 (×9): qty 1

## 2011-06-01 NOTE — Progress Notes (Signed)
OT Screen Order received, chart reviewed. Spoke briefly with patient who states he has all necessary DME and prn A at home. Pt presents with no OT needs at this time. Will sign off.  Garrel Ridgel, OTR/L  Pager [phone] 06/01/2011

## 2011-06-01 NOTE — Progress Notes (Signed)
CARE MANAGEMENT NOTE 06/01/2011  Patient:  Raymond Graham, Raymond Graham   Account Number:  [id number]  Date Initiated:  06/01/2011  Documentation initiated by:  Tiphani Mells  Subjective/Objective Assessment:   61 yo male admitted 05/31/11 with left hip osteoarthritis S/P left hip replacement     Action/Plan:   D/C when medically stable   Anticipated DC Date:  06/04/2011   Anticipated DC Plan:  HOME W HOME HEALTH SERVICES      DC Planning Services  CM consult      [facility] Choice  HOME HEALTH   Choice offered to / List presented to:  C-1 Patient        HH arranged  HH-2 PT      [facility] agency  [facility]   Status of service:  In process, will continue to follow  Comments:  06/01/11, Kathi Der RNC-MNN, BSN, [phone], CM received referral.  CM met with pt and offered choice for [facility] services.  Pt states that he has used [place] for [facility] services before and will be using them again.  Pt states that he has RW, 3N1, shower stool, cane, and crutches at home.  He states his wife will be at home to assist on a limited basis.   Debbie at [place] contacted with [facility] orders and confirmation of services received.

## 2011-06-01 NOTE — Progress Notes (Signed)
Subjective: 1 Day Post-Op Procedure(s) (LRB): TOTAL HIP ARTHROPLASTY ANTERIOR APPROACH (Left)   Patient reports pain as moderate. Increased pain. Otherwise no events.  Objective:   VITALS:   Filed Vitals:   06/01/11 0906  BP: 138/85  Pulse: 68  Temp:   Resp: 18    Neurovascular intact Dorsiflexion/Plantar flexion intact Incision: dressing C/D/I No cellulitis present Compartment soft  LABS  Basename 06/01/11 0350  HGB 12.2*  HCT 35.6*  WBC 10.5  PLT 211     Basename 06/01/11 0350  NA 135  K 4.0  BUN 14  CREATININE 0.89  GLUCOSE 138*     Assessment/Plan: 1 Day Post-Op Procedure(s) (LRB): TOTAL HIP ARTHROPLASTY ANTERIOR APPROACH (Left)   Foley cath d/c'ed HV drain d/c'ed Advance diet Up with therapy D/C IV fluids Discharge home with home health upon discharge Toradol added to assist in the management of pain and inflammation.  Anastasio Auerbach Paxtyn Boyar   PAC  06/01/2011, 9:38 AM

## 2011-06-01 NOTE — Evaluation (Signed)
Physical Therapy Evaluation Patient Details Name: Raymond Graham MRN: [id number] DOB: Feb 12, 1951 Today's Date: 06/01/2011  Problem List:  Patient Active Problem List  Diagnoses  . S/P left THA, AA    Past Medical History:  Past Medical History  Diagnosis Date  . Hypertension   . Arthritis     PAIN AND ARTHRITIS LEFT HIP AND LEFT KNEE  . BPH (benign prostatic hypertrophy)     PT ON FLOMAX-DR. WRENN IS PT'S UROLOGIST  . Seasonal allergies   . GERD (gastroesophageal reflux disease)     OCAS-NO MEDS   Past Surgical History:  Past Surgical History  Procedure Date  . Back surgery 10/2008    LUMBAR SURGERY FOR STENOSIS  . Joint replacement 2005    RIGHT HIP REPLACEMENT   . Left ganglion cyst removed   . Left bunionectomy     PT Assessment/Plan/Recommendation PT Assessment Clinical Impression Statement: Patient s/p left THA presents with decreased independence due to decreased AROM/strength, decreased activity tolerance with acute pain and will benefit from skilled PT in acute setting to maximize independence and safety for d/c home with assist and HHPT. PT Recommendation/Assessment: Patient will need skilled PT in the acute care venue PT Problem List: Decreased range of motion;Decreased strength;Decreased activity tolerance;Decreased mobility;Pain PT Therapy Diagnosis : Difficulty walking;Acute pain PT Plan PT Frequency: 7X/week PT Treatment/Interventions: Functional mobility training;Stair training;Gait training;DME instruction;Patient/family education;Therapeutic activities;Therapeutic exercise;Balance training PT Recommendation Follow Up Recommendations: Home health PT Equipment Recommended: None recommended by PT PT Goals  Acute Rehab PT Goals PT Goal Formulation: With patient Time For Goal Achievement: 2 weeks Pt will go Supine/Side to Sit: with supervision PT Goal: Supine/Side to Sit - Progress: Goal set today Pt will go Sit to Supine/Side: with  supervision PT Goal: Sit to Supine/Side - Progress: Goal set today Pt will go Sit to Stand: with supervision PT Goal: Sit to Stand - Progress: Goal set today Pt will go Stand to Sit: with supervision PT Goal: Stand to Sit - Progress: Goal set today Pt will Ambulate: >150 feet;with modified independence;with rolling walker PT Goal: Ambulate - Progress: Goal set today Pt will Go Up / Down Stairs: 3-5 stairs;with min assist;with rolling walker PT Goal: Up/Down Stairs - Progress: Goal set today Pt will Perform Home Exercise Program: with supervision, verbal cues required/provided PT Goal: Perform Home Exercise Program - Progress: Goal set today  PT Evaluation Precautions/Restrictions  Restrictions Weight Bearing Restrictions: Yes LLE Weight Bearing: Weight bearing as tolerated Prior Functioning  Home Living Lives With: Spouse Type of Home: House Home Layout: One level Home Access: Stairs to enter Entrance Stairs-Rails: None Entrance Stairs-Number of Steps: 4 in garage Bathroom Shower/Tub: Naval architect Equipment: Bedside commode/3-in-1;Walker - rolling;Shower chair with back Prior Function Level of Independence: Independent with basic ADLs;Independent with transfers;Independent with gait Cognition Cognition Arousal/Alertness: Awake/alert Overall Cognitive Status: Appears within functional limits for tasks assessed Sensation/Coordination   Extremity Assessment RLE Assessment RLE Assessment: Within Functional Limits LLE Assessment LLE Assessment: Exceptions to WFL LLE AROM (degrees) LLE Overall AROM Comments: ankle WFL, knee and hip AAROM 30-40 in supine LLE Strength LLE Overall Strength Comments: ankle WFL, able to perform quad set Mobility (including Balance) Bed Mobility Bed Mobility: Yes Supine to Sit: 3: Mod assist;With rails Supine to Sit Details (indicate cue type and reason): cues for technique Transfers Transfers: Yes Sit to Stand: From bed;With  upper extremity assist;1: +2 Total assist;Patient percentage (comment) (75) Sit to Stand Details (indicate cue type and reason): cues  for tedchnique Stand to Sit: With upper extremity assist;To chair/3-in-1;4: Min assist Ambulation/Gait Ambulation/Gait: Yes Ambulation/Gait Assistance: 4: Min assist;5: Supervision Ambulation/Gait Assistance Details (indicate cue type and reason): cues for walker safety, sequence Ambulation Distance (Feet): 150 Feet Assistive device: Rolling walker Gait Pattern: Step-to pattern;Antalgic    Exercise  Total Joint Exercises Ankle Circles/Pumps: AROM;Both;10 reps;Supine Quad Sets: AROM;Left;10 reps;Supine Heel Slides: AROM;AAROM;Right;Left;5 reps;Supine End of Session PT - End of Session Equipment Utilized During Treatment: Gait belt Activity Tolerance: Patient tolerated treatment well Patient left: in chair;with call bell in reach General Behavior During Session: [facility] for tasks performed Cognition: [facility] for tasks performed  [facility] 06/01/2011, 1:21 PM

## 2011-06-01 NOTE — Progress Notes (Signed)
Physical Therapy Treatment Patient Details Name: Raymond Graham MRN: [id number] DOB: 22-Aug-1950 Today's Date: 06/01/2011  PT Assessment/Plan  PT - Assessment/Plan Comments on Treatment Session: Progressing well for anticipated d/c tomorrow after stair negotiation practice. PT Plan: Discharge plan remains appropriate PT Frequency: 7X/week Follow Up Recommendations: Home health PT Equipment Recommended: None recommended by PT PT Goals  Acute Rehab PT Goals PT Goal Formulation: With patient Time For Goal Achievement: 2 weeks Pt will go Supine/Side to Sit: with supervision PT Goal: Supine/Side to Sit - Progress: Goal set today Pt will go Sit to Supine/Side: with supervision PT Goal: Sit to Supine/Side - Progress: Goal set today Pt will go Sit to Stand: with supervision PT Goal: Sit to Stand - Progress: Met Pt will go Stand to Sit: with supervision PT Goal: Stand to Sit - Progress: Met Pt will Ambulate: >150 feet;with modified independence;with rolling walker PT Goal: Ambulate - Progress: Progressing toward goal Pt will Go Up / Down Stairs: 3-5 stairs;with min assist;with rolling walker PT Goal: Up/Down Stairs - Progress: Goal set today Pt will Perform Home Exercise Program: with supervision, verbal cues required/provided PT Goal: Perform Home Exercise Program - Progress: Progressing toward goal  PT Treatment Precautions/Restrictions  Restrictions Weight Bearing Restrictions: Yes LLE Weight Bearing: Weight bearing as tolerated Mobility (including Balance) Bed Mobility Bed Mobility: Yes Supine to Sit: 3: Mod assist;With rails Supine to Sit Details (indicate cue type and reason): cues for technique Transfers Transfers: Yes Sit to Stand: 6: Modified independent (Device/Increase time);From chair/3-in-1 Sit to Stand Details (indicate cue type and reason): stood from chair while PT waiting in hall due to patient requesting to use urinal Stand to Sit: 5: Supervision;With upper  extremity assist;To chair/3-in-1 Ambulation/Gait Ambulation/Gait: Yes Ambulation/Gait Assistance: 5: Supervision Ambulation/Gait Assistance Details (indicate cue type and reason): step through sequence Ambulation Distance (Feet): 160 Feet Assistive device: Rolling walker Gait Pattern: Antalgic;Step-through pattern    Exercise  Total Joint Exercises Ankle Circles/Pumps: AROM;10 reps;Seated Quad Sets: AROM;Left;10 reps;Supine Heel Slides: AAROM;Left;10 reps;Seated Hip ABduction/ADduction: AAROM;Left;Seated Long Arc Quad: AROM;Left;10 reps;Seated End of Session PT - End of Session Equipment Utilized During Treatment: Gait belt Activity Tolerance: Patient tolerated treatment well Patient left: in chair General Behavior During Session: [facility] for tasks performed Cognition: [facility] for tasks performed  [facility] 06/01/2011, 4:11 PM

## 2011-06-02 LAB — BASIC METABOLIC PANEL
BUN: 23 mg/dL (ref 6–23)
CO2: 30 mEq/L (ref 19–32)
Calcium: 9 mg/dL (ref 8.4–10.5)
Glucose, Bld: 134 mg/dL — ABNORMAL HIGH (ref 70–99)
Sodium: 135 mEq/L (ref 135–145)

## 2011-06-02 LAB — CBC
HCT: 30.9 % — ABNORMAL LOW (ref 39.0–52.0)
Hemoglobin: 10.6 g/dL — ABNORMAL LOW (ref 13.0–17.0)
MCH: 29.2 pg (ref 26.0–34.0)
MCV: 85.1 fL (ref 78.0–100.0)
RBC: 3.63 MIL/uL — ABNORMAL LOW (ref 4.22–5.81)

## 2011-06-02 MED ORDER — FERROUS SULFATE 325 (65 FE) MG PO TABS: 325.0000 mg | ORAL_TABLET | Freq: Three times a day (TID) | ORAL | Status: DC

## 2011-06-02 MED ORDER — ASPIRIN EC 325 MG PO TBEC: 325.0000 mg | DELAYED_RELEASE_TABLET | Freq: Two times a day (BID) | ORAL | Status: AC

## 2011-06-02 MED ORDER — POLYETHYLENE GLYCOL 3350 17 G PO PACK: 17.0000 g | PACK | Freq: Every day | ORAL | Status: AC | PRN

## 2011-06-02 MED ORDER — HYDROCODONE-ACETAMINOPHEN 7.5-325 MG PO TABS: 1.0000 | ORAL_TABLET | ORAL | Status: AC | PRN

## 2011-06-02 MED ORDER — DSS 100 MG PO CAPS: 100.0000 mg | ORAL_CAPSULE | Freq: Two times a day (BID) | ORAL | Status: AC

## 2011-06-02 MED ORDER — METHOCARBAMOL 500 MG PO TABS: 500.0000 mg | ORAL_TABLET | Freq: Four times a day (QID) | ORAL | Status: AC | PRN

## 2011-06-02 NOTE — Progress Notes (Signed)
Physical Therapy Treatment Patient Details Name: Raymond Graham MRN: [id number] DOB: 07/31/1950 Today's Date: 06/02/2011  PT Assessment/Plan  PT - Assessment/Plan Comments on Treatment Session: Patient ready for d/c.  Reviewed car transfer verbally and discussed HHPT. PT Plan: Discharge plan remains appropriate PT Frequency: Other (Comment) (d/c planned today) Follow Up Recommendations: Home health PT Equipment Recommended: None recommended by PT PT Goals  Acute Rehab PT Goals Pt will go Supine/Side to Sit: with supervision PT Goal: Supine/Side to Sit - Progress: Progressing toward goal Pt will go Sit to Stand: with supervision PT Goal: Sit to Stand - Progress: Met Pt will go Stand to Sit: with supervision PT Goal: Stand to Sit - Progress: Met Pt will Ambulate: >150 feet;with modified independence;with rolling walker PT Goal: Ambulate - Progress: Progressing toward goal Pt will Go Up / Down Stairs: 3-5 stairs;with min assist;with rolling walker PT Goal: Up/Down Stairs - Progress: Met Pt will Perform Home Exercise Program: with supervision, verbal cues required/provided PT Goal: Perform Home Exercise Program - Progress: Progressing toward goal  PT Treatment Precautions/Restrictions  Restrictions Weight Bearing Restrictions: Yes LLE Weight Bearing: Weight bearing as tolerated Mobility (including Balance) Bed Mobility Supine to Sit: 4: Min assist Supine to Sit Details (indicate cue type and reason): minguard initially for left LE Transfers Sit to Stand: From bed;With upper extremity assist;6: Modified independent (Device/Increase time) Stand to Sit: To chair/3-in-1;With upper extremity assist;With armrests;5: Supervision Stand to Sit Details: for safety Ambulation/Gait Ambulation/Gait Assistance: 5: Supervision Ambulation/Gait Assistance Details (indicate cue type and reason): cues for step through technique Ambulation Distance (Feet): 150 Feet Assistive device:  Rolling walker Gait Pattern: Step-through pattern Stairs: Yes Stairs Assistance: 4: Min assist Stairs Assistance Details (indicate cue type and reason): cues for technique Stair Management Technique: Backwards;With walker Number of Stairs: 4     Exercise  Total Joint Exercises Ankle Circles/Pumps: AROM;10 reps;Supine;5 reps Short Arc Quad: AROM;Left;10 reps;Supine Heel Slides: AROM;Left;10 reps;Supine Hip ABduction/ADduction: AAROM;Left;10 reps;Supine End of Session PT - End of Session Equipment Utilized During Treatment: Gait belt;Other (comment) (issued handouts for HEP and technique on steps) Activity Tolerance: Patient tolerated treatment well Patient left: in chair;with call bell in reach General Behavior During Session: [facility] for tasks performed Cognition: [facility] for tasks performed  [facility] 06/02/2011, 1:53 PM

## 2011-06-02 NOTE — Progress Notes (Signed)
Subjective: 2 Days Post-Op Procedure(s) (LRB): TOTAL HIP ARTHROPLASTY ANTERIOR APPROACH (Left)   Patient reports pain as mild. Less pain today. Feels ready to be discharged home with HHPT.  Objective:   VITALS:   Filed Vitals:   06/02/11 1000  BP: 120/68  Pulse: 64  Temp: 97.9 F (36.6 C)  Resp: 20    Neurovascular intact Dorsiflexion/Plantar flexion intact Incision: dressing C/D/I No cellulitis present Compartment soft  LABS  Basename 06/02/11 0335 06/01/11 0350  HGB 10.6* 12.2*  HCT 30.9* 35.6*  WBC 11.8* 10.5  PLT 188 211     Basename 06/02/11 0335 06/01/11 0350  NA 135 135  K 4.2 4.0  BUN 23 14  CREATININE 1.21 0.89  GLUCOSE 134* 138*     Assessment/Plan: 2 Days Post-Op Procedure(s) (LRB): TOTAL HIP ARTHROPLASTY ANTERIOR APPROACH (Left)   Up with therapy D/C IV fluids Discharge home with home health Follow up in 2 weeks   Follow-up Information    Follow up with OLIN,Ruth Tully D in 2 weeks.   Contact information:   [facility] [address] [place] [address] [phone]          Anastasio Auerbach. Janthony Holleman   PAC  06/02/2011, 12:24 PM

## 2011-06-02 NOTE — Progress Notes (Signed)
Patient provided with discharge teaching and prescriptions. Patient verbalized understanding. Patient discharged to home. 

## 2011-06-05 NOTE — Discharge Summary (Signed)
Physician Discharge Summary  Patient ID: Raymond Graham MRN: [id number] DOB/AGE: Aug 26, 1950 60 y.o.  Admit date: 05/31/2011 Discharge date: 06/02/2011  Procedures:  Procedure(s) (LRB): TOTAL HIP ARTHROPLASTY ANTERIOR APPROACH (Left)  Attending Physician: Dr. Durene Romans  Admission Diagnoses: Left hip osteoarthritis  Discharge Diagnoses:  Principal Problem:  *S/P left THA, AA HTN BPH Allergies  HPI: This is a 61 year old gentleman with a history of osteoarthritis of his left hip that has failed conservative management. He has had a previous right total hip arthroplasty in 2005, done quite well with it and is now scheduled for a total hip arthroplasty of the left hip by anterior approach. The surgery, risks, benefits, and aftercare were discussed in detail with the patient, questions invited and answered. Note, his medical doctor is Dr. Dorothyann Peng. He is a candidate for tranexamic acid and dexamethasone, and will receive those in the preop. He does plan on going home after surgery. He is given his home medications of aspirin, Robaxin, iron, MiraLax, and Colace.  PCP: Gwynneth Aliment, MD, MD   Discharged Condition: good  Hospital Course:  Patient underwent the above stated procedure on 05/31/2011. Patient tolerated the procedure well and brought to the recovery room in good condition and subsequently to the floor.  POD #1 BP: 138/85 ; Pulse: 68 ; Resp: 18  Pt's foley was removed, as well as the hemovac drain removed. IV was changed to a saline lock. Patient reports pain as moderate. Increased pain. Otherwise no events. Neurovascular intact, dorsiflexion/plantar flexion intact, incision: dressing C/D/I, no cellulitis present and compartment soft.   LABS  Basename  06/01/11 0350   HGB  12.2*  HCT  35.6*    POD #2  BP: 120/68 ; Pulse: 64 ; Temp: 97.9 F (36.6 C) ; Resp: 20 Patient reports pain as mild. Less pain today. Feels ready to be discharged home with  HHPT. Neurovascular intact, dorsiflexion/plantar flexion intact, incision: dressing C/D/I, no cellulitis present and compartment soft.   LABS  Basename  06/02/11 0335   HGB  10.6*  HCT  30.9*    Discharge Exam: General appearance: alert, cooperative and no distress Extremities: Homans sign is negative, no sign of DVT, no edema, redness or tenderness in the calves or thighs and no ulcers, gangrene or trophic changes  Disposition: Home-Health Care Svc with follow up in 2 weeks  Follow-up Information    Follow up with OLIN,Ayeden Gladman D in 2 weeks.   Contact information:   [facility] [address] [place] [address] [phone]          Discharge Orders    Future Orders Please Complete By Expires   Diet - low sodium heart healthy      Call MD / Call 911      Comments:   If you experience chest pain or shortness of breath, CALL 911 and be transported to the hospital emergency room.  If you develope a fever above 101 F, pus (white drainage) or increased drainage or redness at the wound, or calf pain, call your surgeon's office.   Discharge instructions      Comments:   Maintain surgical dressing for 8 days, then replace with gauze and tape. Keep the area dry and clean until follow up. Follow up in 2 weeks at [facility]. Call with any questions or concerns.     Constipation Prevention      Comments:   Drink plenty of fluids.  Prune juice may be helpful.  You may use a stool softener, such as Colace (over the counter) 100 mg twice a day.  Use MiraLax (over the counter) for constipation as needed.   Increase activity slowly as tolerated      Weight Bearing as taught in Physical Therapy      Comments:   Use a walker or crutches as instructed.   Driving restrictions      Comments:   No driving for 4 weeks   Change dressing      Comments:   Maintain surgical dressing for 8 days, then replace with 4x4 guaze and tape. Keep  the area dry and clean.   TED hose      Comments:   Use stockings (TED hose) for 2 weeks on both leg(s).  You may remove them at night for sleeping.      Discharge Medication List as of 06/02/2011 12:33 PM    START taking these medications   Details  aspirin EC 325 MG tablet Take 1 tablet (325 mg total) by mouth 2 (two) times daily. X 4 weeks, Starting 06/02/2011, Until Sun 06/12/11, No Print    docusate sodium 100 MG CAPS Take 100 mg by mouth 2 (two) times daily., Starting 06/02/2011, Until Sun 06/12/11, No Print    ferrous sulfate 325 (65 FE) MG tablet Take 1 tablet (325 mg total) by mouth 3 (three) times daily after meals., Starting 06/02/2011, Until Fri 06/01/12, No Print    HYDROcodone-acetaminophen (NORCO) 7.5-325 MG per tablet Take 1-2 tablets by mouth every 4 (four) hours as needed., Starting 06/02/2011, Until Sun 06/12/11, Print    methocarbamol (ROBAXIN) 500 MG tablet Take 1 tablet (500 mg total) by mouth every 6 (six) hours as needed (muscle spasms)., Starting 06/02/2011, Until Sun 06/12/11, No Print    polyethylene glycol (MIRALAX / GLYCOLAX) packet Take 17 g by mouth daily as needed., Starting 06/02/2011, Until Sun 06/05/11, No Print      CONTINUE these medications which have NOT CHANGED   Details  losartan-hydrochlorothiazide (HYZAAR) 50-12.5 MG per tablet Take 1 tablet by mouth daily after breakfast., Until Discontinued, Historical Med    metoprolol succinate (TOPROL-XL) 100 MG 24 hr tablet Take 100 mg by mouth daily after breakfast. Take with or immediately following a meal., Until Discontinued, Historical Med    mometasone (NASONEX) 50 MCG/ACT nasal spray Place 2 sprays into the nose daily., Until Discontinued, Historical Med    Tamsulosin HCl (FLOMAX) 0.4 MG CAPS Take 0.4 mg by mouth daily after breakfast., Until Discontinued, Historical Med    Cholecalciferol (VITAMIN D) 2000 UNITS CAPS Take 4,000 Units by mouth daily., Until Discontinued, Historical Med    fish oil-omega-3  fatty acids 1000 MG capsule Take 2 g by mouth daily., Until Discontinued, Historical Med    Lycopene 10 MG CAPS Take 1 capsule by mouth daily., Until Discontinued, Historical Med    PRESCRIPTION MEDICATION Inject 0.08-0.5 mg into the skin 3 (three) times a week. Monday, wednesday, and Friday. Pt increase as he uses the bottle ALLERY SHOTS, Until Discontinued, Historical Med        Signed: Anastasio Auerbach. Jada Fass   PAC  06/05/2011, 9:01 PM

## 2011-06-13 ENCOUNTER — Encounter (HOSPITAL_COMMUNITY): Payer: Self-pay | Admitting: Orthopedic Surgery

## 2011-11-10 ENCOUNTER — Encounter (HOSPITAL_COMMUNITY): Payer: Self-pay | Admitting: Pharmacy Technician

## 2011-11-11 NOTE — Patient Instructions (Addendum)
20 AKSH SWART  11/11/2011   Your procedure is scheduled on:  11/21/11 1225pm-220pm  Report to [facility] at1000 AM.  Call this number if you have problems the morning of surgery: [phone]   Remember:   Do not eat food:After Midnight.  May have clear liquids:until Midnight .    Take these medicines the morning of surgery with A SIP OF WATER:   Do not wear jewelry,   Do not wear lotions, powders, or perfumes.    Men may shave face and neck.  Do not bring valuables to the hospital.  Contacts, dentures or bridgework may not be worn into surgery.  Leave suitcase in the car. After surgery it may be brought to your room.  For patients admitted to the hospital, checkout time is 11:00 AM the day of discharge.     Special Instructions: CHG Shower Use Special Wash: 1/2 bottle night before surgery and 1/2 bottle morning of surgery. shower chin to toes with CHG.  Wash face and private parts with regular soap.     Please read over the following fact sheets that you were given: MRSA Information, Incentive Spirometry Fact Sheet, Blood Transfusion FAct Sheet , coughing and deep breathing exercises, leg exercises

## 2011-11-14 ENCOUNTER — Encounter (HOSPITAL_COMMUNITY): Payer: Self-pay

## 2011-11-14 ENCOUNTER — Encounter (HOSPITAL_COMMUNITY)
Admission: RE | Admit: 2011-11-14 | Discharge: 2011-11-14 | Disposition: A | Discharge status: Home / Self Care | Payer: Medicare Other | Source: Ambulatory Visit | Attending: Orthopedic Surgery | Admitting: Orthopedic Surgery

## 2011-11-14 LAB — BASIC METABOLIC PANEL
BUN: 9 mg/dL (ref 6–23)
Chloride: 103 mEq/L (ref 96–112)
GFR calc Af Amer: >90 mL/min (ref >90)
GFR calc non Af Amer: 87 mL/min — ABNORMAL LOW (ref >90)
Potassium: 3.7 mEq/L (ref 3.5–5.1)
Sodium: 140 mEq/L (ref 135–145)

## 2011-11-14 LAB — CBC
HCT: 42 % (ref 39.0–52.0)
Hemoglobin: 13.9 g/dL (ref 13.0–17.0)
RBC: 4.94 MIL/uL (ref 4.22–5.81)

## 2011-11-14 LAB — PROTIME-INR
INR: 1.65 — ABNORMAL HIGH (ref 0.00–1.49)
Prothrombin Time: 19.8 seconds — ABNORMAL HIGH (ref 11.6–15.2)

## 2011-11-14 LAB — SURGICAL PCR SCREEN
MRSA, PCR: NEGATIVE
Staphylococcus aureus: NEGATIVE

## 2011-11-14 LAB — URINALYSIS, ROUTINE W REFLEX MICROSCOPIC
Bilirubin Urine: NEGATIVE
Leukocytes, UA: NEGATIVE
Nitrite: NEGATIVE
Specific Gravity, Urine: 1.019 (ref 1.005–1.030)
Urobilinogen, UA: 0.2 mg/dL (ref 0.0–1.0)
pH: 6 (ref 5.0–8.0)

## 2011-11-14 NOTE — Progress Notes (Signed)
Abnormal Pt/INR faxed and confirmation received to Dr Charlann Boxer.  Will repeat am of surgery.

## 2011-11-14 NOTE — Progress Notes (Signed)
Clearance note on chart from Dr Allyne Gee.  EKG 2.1.13 on chart  CXR 05/25/11 in [facility]  05/27/11 Office Visit Note from Dr Jacinto Halim on chart

## 2011-11-16 NOTE — Progress Notes (Signed)
H&P performed 11/16/11 Dictation # [id number]

## 2011-11-17 NOTE — H&P (Signed)
Raymond Graham, Raymond Graham NO.:  [id number]  MEDICAL RECORD NO.:  [id number]  LOCATION:                               FACILITY:  [facility]  PHYSICIAN:  Madlyn Frankel. Charlann Boxer, M.D.  DATE OF BIRTH:  December 30, 1950  DATE OF ADMISSION:  11/21/2011 DATE OF DISCHARGE:                             HISTORY & PHYSICAL   DATE OF SURGERY:  November 21, 2011.  ADMITTING DIAGNOSIS:  End-stage osteoarthritis of his left knee.  PROPOSED PROCEDURE:  Total knee arthroplasty, left knee.  BRIEF HISTORY:  This is a 61 year old gentleman with a history of end- stage osteoarthritis of his left knee that has failed conservative treatment to alleviate his symptoms.  After discussion of treatment, benefits, risks, and options, the patient is now scheduled for total knee arthroplasty of the left knee.  Note that he is a candidate for tranexamic acid and dexamethasone, and will receive both at surgery. His medical doctor is Dr. Dorothyann Peng.  He does plan on going home after surgery, and he was given his home medications of aspirin, Robaxin, iron, MiraLax, and Colace.  The surgery risks, benefits, and aftercare were discussed in detail with the patient.  Questions invited and answered.  PAST MEDICAL HISTORY:  Drug Allergies:  None.  Current Medications: 1. Tamsulosin 0.4 mg 1 daily. 2. Losartan/hydrochlorothiazide 12.5 mg 1 daily. 3. Trazodone 150 mg p.r.n. 4. Metoprolol 100 mg 1 daily. 5. CoQ10 100 mg 1 daily. 6. Centrum Silver 1 daily. 7. D3 2000 mg 2 daily. 8. Lycopene 10 mg 1 daily. 9. Omega fish oil 1200 mg 1 daily. 10.Nasonex p.r.n.  Serious medical illnesses include BPH, hypertension, reflux, and arthritis.  Previous surgeries include left total hip replacement, right total hip replacement, lumbar surgery, left foot surgery, and ganglion cyst surgery.  FAMILY HISTORY:  Positive for cancer and arthritis.  SOCIAL HISTORY:  The patient is married.  He is retired.  He used to smoke, but  does not smoke any more and drinks about a pint every 2 weeks.  He does again plan on going home after surgery.  PHYSICAL EXAMINATION:  GENERAL:  This is a well-developed, well- nourished gentleman, in no acute distress. VITAL SIGNS:  Blood pressure 120/88, pulse 60 and regular, respirations 12. HEENT.  Head normocephalic.  Nose patent.  Ears patent.  Pupils equal, round, and reactive to light.  Throat without injection. NECK:  Supple without adenopathy.  Carotids 2+ without bruits. CHEST:  Clear to auscultation.  No rales or rhonchi.  Respirations 12. HEART:  Regular rate and rhythm at 60 beats per minute without murmur. ABDOMEN:  Soft with active bowel sounds.  No masses or organomegaly. NEUROLOGIC:  The patient is alert and oriented to time, place, and person.  Cranial nerves II-XII grossly intact. EXTREMITIES:  Significant varus deformity of both knees with about 3 degrees from full extension and further flexion to 120 degrees. Sensation and circulation are intact.  ASSESSMENT:  End-stage osteoarthritis, left knee.  PLAN:  Total knee arthroplasty left knee x2.     Jaquelyn Bitter. Lucresha Dismuke, P.A.   ______________________________ Madlyn Frankel Charlann Boxer, M.D.    SJC/MEDQ  D:  11/16/2011  T:  11/17/2011  Job:  [id number] 

## 2011-11-21 ENCOUNTER — Encounter (HOSPITAL_COMMUNITY)
Admission: RE | Disposition: A | Discharge status: Home Health | Payer: Self-pay | Source: Ambulatory Visit | Attending: Orthopedic Surgery

## 2011-11-21 ENCOUNTER — Inpatient Hospital Stay (HOSPITAL_COMMUNITY)
Admission: RE | Admit: 2011-11-21 | Discharge: 2011-11-23 | DRG: 470 | Disposition: A | Discharge status: Home Health | Payer: Medicare Other | Source: Ambulatory Visit | Attending: Orthopedic Surgery | Admitting: Orthopedic Surgery

## 2011-11-21 ENCOUNTER — Encounter (HOSPITAL_COMMUNITY): Payer: Self-pay | Admitting: Anesthesiology

## 2011-11-21 ENCOUNTER — Ambulatory Visit (HOSPITAL_COMMUNITY): Payer: Medicare Other | Admitting: Anesthesiology

## 2011-11-21 ENCOUNTER — Encounter (HOSPITAL_COMMUNITY): Payer: Self-pay | Admitting: *Deleted

## 2011-11-21 DIAGNOSIS — K219 Gastro-esophageal reflux disease without esophagitis: Secondary | ICD-10-CM | POA: Diagnosis present

## 2011-11-21 DIAGNOSIS — M1710 Unilateral primary osteoarthritis, unspecified knee: Principal | ICD-10-CM | POA: Diagnosis present

## 2011-11-21 DIAGNOSIS — I10 Essential (primary) hypertension: Secondary | ICD-10-CM | POA: Diagnosis present

## 2011-11-21 DIAGNOSIS — Z96659 Presence of unspecified artificial knee joint: Secondary | ICD-10-CM

## 2011-11-21 DIAGNOSIS — Z01812 Encounter for preprocedural laboratory examination: Secondary | ICD-10-CM

## 2011-11-21 HISTORY — PX: TOTAL KNEE ARTHROPLASTY: SHX125

## 2011-11-21 LAB — TYPE AND SCREEN: ABO/RH(D): AB POS

## 2011-11-21 LAB — PROTIME-INR: Prothrombin Time: 19.3 seconds — ABNORMAL HIGH (ref 11.6–15.2)

## 2011-11-21 SURGERY — ARTHROPLASTY, KNEE, TOTAL
Anesthesia: Spinal | Site: Knee | Laterality: Left | Wound class: Clean

## 2011-11-21 MED ORDER — PHENOL 1.4 % MT LIQD: 1.0000 | OROMUCOSAL | Status: DC | PRN

## 2011-11-21 MED ORDER — ACETAMINOPHEN 10 MG/ML IV SOLN
INTRAVENOUS | Status: AC
  Filled 2011-11-21: qty 100

## 2011-11-21 MED ORDER — FERROUS SULFATE 325 (65 FE) MG PO TABS
325.0000 mg | ORAL_TABLET | Freq: Three times a day (TID) | ORAL | Status: DC
  Administered 2011-11-22 – 2011-11-23 (×4): 325 mg via ORAL
  Filled 2011-11-21 (×8): qty 1

## 2011-11-21 MED ORDER — BUPIVACAINE-EPINEPHRINE PF 0.25-1:200000 % IJ SOLN
INTRAMUSCULAR | Status: DC | PRN
  Administered 2011-11-21: 50 mL

## 2011-11-21 MED ORDER — PROPOFOL 10 MG/ML IV EMUL
INTRAVENOUS | Status: DC | PRN
  Administered 2011-11-21: 50 ug/kg/min via INTRAVENOUS

## 2011-11-21 MED ORDER — POLYETHYLENE GLYCOL 3350 17 G PO PACK
17.0000 g | PACK | Freq: Two times a day (BID) | ORAL | Status: DC
  Administered 2011-11-21 – 2011-11-22 (×3): 17 g via ORAL

## 2011-11-21 MED ORDER — ROPIVACAINE HCL 5 MG/ML IJ SOLN
INTRAMUSCULAR | Status: AC
  Filled 2011-11-21: qty 30

## 2011-11-21 MED ORDER — CEFAZOLIN SODIUM-DEXTROSE 2-3 GM-% IV SOLR
INTRAVENOUS | Status: AC
  Filled 2011-11-21: qty 50

## 2011-11-21 MED ORDER — METOCLOPRAMIDE HCL 5 MG/ML IJ SOLN: 5.0000 mg | Freq: Three times a day (TID) | INTRAMUSCULAR | Status: DC | PRN

## 2011-11-21 MED ORDER — TRANEXAMIC ACID 100 MG/ML IV SOLN
1450.0000 mg | Freq: Once | INTRAVENOUS | Status: AC
  Administered 2011-11-21: 1450 mg via INTRAVENOUS
  Filled 2011-11-21: qty 14.5

## 2011-11-21 MED ORDER — HYDROCODONE-ACETAMINOPHEN 7.5-325 MG PO TABS
1.0000 | ORAL_TABLET | ORAL | Status: DC | PRN
  Administered 2011-11-21: 2 via ORAL
  Filled 2011-11-21: qty 2

## 2011-11-21 MED ORDER — TAMSULOSIN HCL 0.4 MG PO CAPS
0.4000 mg | ORAL_CAPSULE | Freq: Every day | ORAL | Status: DC
  Administered 2011-11-22 – 2011-11-23 (×2): 0.4 mg via ORAL
  Filled 2011-11-21 (×2): qty 1

## 2011-11-21 MED ORDER — METOPROLOL SUCCINATE ER 100 MG PO TB24
100.0000 mg | ORAL_TABLET | Freq: Every day | ORAL | Status: DC
  Administered 2011-11-22 – 2011-11-23 (×2): 100 mg via ORAL
  Filled 2011-11-21 (×2): qty 1

## 2011-11-21 MED ORDER — BUPIVACAINE HCL 0.75 % IJ SOLN
INTRAMUSCULAR | Status: DC | PRN
  Administered 2011-11-21: 15 mg via INTRATHECAL

## 2011-11-21 MED ORDER — DOCUSATE SODIUM 100 MG PO CAPS
100.0000 mg | ORAL_CAPSULE | Freq: Two times a day (BID) | ORAL | Status: DC
  Administered 2011-11-21 – 2011-11-23 (×4): 100 mg via ORAL

## 2011-11-21 MED ORDER — CEFAZOLIN SODIUM-DEXTROSE 2-3 GM-% IV SOLR
2.0000 g | INTRAVENOUS | Status: AC
  Administered 2011-11-21: 2 g via INTRAVENOUS

## 2011-11-21 MED ORDER — METOCLOPRAMIDE HCL 10 MG PO TABS: 5.0000 mg | ORAL_TABLET | Freq: Three times a day (TID) | ORAL | Status: DC | PRN

## 2011-11-21 MED ORDER — CHLORHEXIDINE GLUCONATE 4 % EX LIQD
60.0000 mL | Freq: Once | CUTANEOUS | Status: DC
  Filled 2011-11-21: qty 60

## 2011-11-21 MED ORDER — SODIUM CHLORIDE 0.9 % IV SOLN
INTRAVENOUS | Status: DC
  Administered 2011-11-21 – 2011-11-22 (×2): via INTRAVENOUS
  Filled 2011-11-21 (×7): qty 1000

## 2011-11-21 MED ORDER — MENTHOL 3 MG MT LOZG: 1.0000 | LOZENGE | OROMUCOSAL | Status: DC | PRN

## 2011-11-21 MED ORDER — SODIUM CHLORIDE 0.9 % IR SOLN
Status: DC | PRN
  Administered 2011-11-21: 1000 mL

## 2011-11-21 MED ORDER — MIDAZOLAM HCL 5 MG/5ML IJ SOLN
INTRAMUSCULAR | Status: DC | PRN
  Administered 2011-11-21: 2 mg via INTRAVENOUS

## 2011-11-21 MED ORDER — HYDROMORPHONE HCL PF 1 MG/ML IJ SOLN
1.0000 mg | INTRAMUSCULAR | Status: DC | PRN
  Administered 2011-11-22: 1 mg via INTRAVENOUS
  Filled 2011-11-21: qty 1

## 2011-11-21 MED ORDER — LACTATED RINGERS IV SOLN: INTRAVENOUS | Status: DC

## 2011-11-21 MED ORDER — HYDROCHLOROTHIAZIDE 12.5 MG PO CAPS
12.5000 mg | ORAL_CAPSULE | Freq: Every day | ORAL | Status: DC
  Administered 2011-11-22 – 2011-11-23 (×2): 12.5 mg via ORAL
  Filled 2011-11-21 (×2): qty 1

## 2011-11-21 MED ORDER — KETOROLAC TROMETHAMINE 30 MG/ML IJ SOLN
INTRAMUSCULAR | Status: AC
  Filled 2011-11-21: qty 1

## 2011-11-21 MED ORDER — EPHEDRINE SULFATE 50 MG/ML IJ SOLN
INTRAMUSCULAR | Status: DC | PRN
  Administered 2011-11-21: 10 mg via INTRAVENOUS

## 2011-11-21 MED ORDER — FENTANYL CITRATE 0.05 MG/ML IJ SOLN
INTRAMUSCULAR | Status: DC | PRN
  Administered 2011-11-21: 100 ug via INTRAVENOUS

## 2011-11-21 MED ORDER — LYCOPENE 10 MG PO CAPS: 1.0000 | ORAL_CAPSULE | Freq: Every day | ORAL | Status: DC

## 2011-11-21 MED ORDER — HYDROMORPHONE HCL PF 1 MG/ML IJ SOLN: 0.2500 mg | INTRAMUSCULAR | Status: DC | PRN

## 2011-11-21 MED ORDER — ACETAMINOPHEN 325 MG PO TABS: 650.0000 mg | ORAL_TABLET | Freq: Four times a day (QID) | ORAL | Status: DC | PRN

## 2011-11-21 MED ORDER — BUPIVACAINE-EPINEPHRINE 0.25% -1:200000 IJ SOLN
INTRAMUSCULAR | Status: AC
  Filled 2011-11-21: qty 1

## 2011-11-21 MED ORDER — ACETAMINOPHEN 650 MG RE SUPP: 650.0000 mg | Freq: Four times a day (QID) | RECTAL | Status: DC | PRN

## 2011-11-21 MED ORDER — CELECOXIB 200 MG PO CAPS
200.0000 mg | ORAL_CAPSULE | Freq: Two times a day (BID) | ORAL | Status: DC
  Administered 2011-11-21: 200 mg via ORAL
  Filled 2011-11-21 (×3): qty 1

## 2011-11-21 MED ORDER — ONDANSETRON HCL 4 MG/2ML IJ SOLN
INTRAMUSCULAR | Status: DC | PRN
  Administered 2011-11-21: 4 mg via INTRAVENOUS

## 2011-11-21 MED ORDER — RIVAROXABAN 10 MG PO TABS
10.0000 mg | ORAL_TABLET | Freq: Every day | ORAL | Status: DC
  Administered 2011-11-22 – 2011-11-23 (×2): 10 mg via ORAL
  Filled 2011-11-21 (×4): qty 1

## 2011-11-21 MED ORDER — DEXAMETHASONE SODIUM PHOSPHATE 10 MG/ML IJ SOLN
10.0000 mg | Freq: Once | INTRAMUSCULAR | Status: AC
  Administered 2011-11-21: 10 mg via INTRAVENOUS

## 2011-11-21 MED ORDER — ACETAMINOPHEN 10 MG/ML IV SOLN
INTRAVENOUS | Status: DC | PRN
  Administered 2011-11-21: 1000 mg via INTRAVENOUS

## 2011-11-21 MED ORDER — CEFAZOLIN SODIUM-DEXTROSE 2-3 GM-% IV SOLR
2.0000 g | Freq: Four times a day (QID) | INTRAVENOUS | Status: DC
  Filled 2011-11-21 (×2): qty 50

## 2011-11-21 MED ORDER — LOSARTAN POTASSIUM 50 MG PO TABS
50.0000 mg | ORAL_TABLET | Freq: Every day | ORAL | Status: DC
  Administered 2011-11-22 – 2011-11-23 (×2): 50 mg via ORAL
  Filled 2011-11-21 (×2): qty 1

## 2011-11-21 MED ORDER — FENTANYL CITRATE 0.05 MG/ML IJ SOLN: 50.0000 ug | INTRAMUSCULAR | Status: DC | PRN

## 2011-11-21 MED ORDER — ONDANSETRON HCL 4 MG PO TABS: 4.0000 mg | ORAL_TABLET | Freq: Four times a day (QID) | ORAL | Status: DC | PRN

## 2011-11-21 MED ORDER — METHOCARBAMOL 500 MG PO TABS
500.0000 mg | ORAL_TABLET | Freq: Four times a day (QID) | ORAL | Status: DC | PRN
Start: 2011-11-21 — End: 2011-11-23
  Administered 2011-11-22 – 2011-11-23 (×5): 500 mg via ORAL
  Filled 2011-11-21 (×5): qty 1

## 2011-11-21 MED ORDER — ONDANSETRON HCL 4 MG/2ML IJ SOLN: 4.0000 mg | Freq: Four times a day (QID) | INTRAMUSCULAR | Status: DC | PRN

## 2011-11-21 MED ORDER — CEFAZOLIN SODIUM-DEXTROSE 2-3 GM-% IV SOLR
2.0000 g | Freq: Four times a day (QID) | INTRAVENOUS | Status: AC
  Administered 2011-11-21 – 2011-11-22 (×2): 2 g via INTRAVENOUS
  Filled 2011-11-21 (×3): qty 50

## 2011-11-21 MED ORDER — LACTATED RINGERS IV SOLN
INTRAVENOUS | Status: DC
  Administered 2011-11-21: 14:00:00 via INTRAVENOUS
  Administered 2011-11-21: 1000 mL via INTRAVENOUS
  Administered 2011-11-21: 14:00:00 via INTRAVENOUS

## 2011-11-21 MED ORDER — DEXAMETHASONE 6 MG PO TABS
10.0000 mg | ORAL_TABLET | Freq: Three times a day (TID) | ORAL | Status: AC
  Administered 2011-11-22: 10 mg via ORAL
  Filled 2011-11-21 (×3): qty 1

## 2011-11-21 MED ORDER — LOSARTAN POTASSIUM-HCTZ 50-12.5 MG PO TABS: 1.0000 | ORAL_TABLET | Freq: Every morning | ORAL | Status: DC

## 2011-11-21 MED ORDER — ADULT MULTIVITAMIN W/MINERALS CH
1.0000 | ORAL_TABLET | Freq: Every day | ORAL | Status: DC
  Administered 2011-11-22 – 2011-11-23 (×2): 1 via ORAL
  Filled 2011-11-21 (×2): qty 1

## 2011-11-21 MED ORDER — 0.9 % SODIUM CHLORIDE (POUR BTL) OPTIME
TOPICAL | Status: DC | PRN
  Administered 2011-11-21: 1000 mL

## 2011-11-21 MED ORDER — DEXAMETHASONE SODIUM PHOSPHATE 10 MG/ML IJ SOLN
10.0000 mg | Freq: Three times a day (TID) | INTRAMUSCULAR | Status: AC
  Administered 2011-11-22: 10 mg via INTRAVENOUS
  Filled 2011-11-21 (×3): qty 1

## 2011-11-21 MED ORDER — MIDAZOLAM HCL 2 MG/2ML IJ SOLN: 1.0000 mg | INTRAMUSCULAR | Status: DC | PRN

## 2011-11-21 MED ORDER — KETOROLAC TROMETHAMINE 30 MG/ML IJ SOLN
INTRAMUSCULAR | Status: DC | PRN
  Administered 2011-11-21: 30 mg via INTRAVENOUS

## 2011-11-21 MED ORDER — METHOCARBAMOL 100 MG/ML IJ SOLN
500.0000 mg | Freq: Four times a day (QID) | INTRAVENOUS | Status: DC | PRN
  Administered 2011-11-21: 500 mg via INTRAVENOUS
  Filled 2011-11-21: qty 5

## 2011-11-21 SURGICAL SUPPLY — 59 items
ADH SKN CLS APL DERMABOND .7 (GAUZE/BANDAGES/DRESSINGS) ×1
BAG SPEC THK2 15X12 ZIP CLS (MISCELLANEOUS) ×1
BAG ZIPLOCK 12X15 (MISCELLANEOUS) ×2 IMPLANT
BANDAGE ELASTIC 6 VELCRO ST LF (GAUZE/BANDAGES/DRESSINGS) ×2 IMPLANT
BANDAGE ESMARK 6X9 LF (GAUZE/BANDAGES/DRESSINGS) ×1 IMPLANT
BLADE SAW SGTL 13.0X1.19X90.0M (BLADE) ×2 IMPLANT
BNDG CMPR 9X6 STRL LF SNTH (GAUZE/BANDAGES/DRESSINGS) ×1
BNDG ESMARK 6X9 LF (GAUZE/BANDAGES/DRESSINGS) ×2
BOWL SMART MIX CTS (DISPOSABLE) ×2 IMPLANT
CEMENT HV SMART SET (Cement) ×2 IMPLANT
CLOTH BEACON ORANGE TIMEOUT ST (SAFETY) ×2 IMPLANT
CUFF TOURN SGL QUICK 34 (TOURNIQUET CUFF) ×2
CUFF TRNQT CYL 34X4X40X1 (TOURNIQUET CUFF) ×1 IMPLANT
DECANTER SPIKE VIAL GLASS SM (MISCELLANEOUS) ×2 IMPLANT
DERMABOND ADVANCED (GAUZE/BANDAGES/DRESSINGS) ×1
DERMABOND ADVANCED .7 DNX12 (GAUZE/BANDAGES/DRESSINGS) ×1 IMPLANT
DRAPE EXTREMITY T 121X128X90 (DRAPE) ×2 IMPLANT
DRAPE POUCH INSTRU U-SHP 10X18 (DRAPES) ×2 IMPLANT
DRAPE U-SHAPE 47X51 STRL (DRAPES) ×2 IMPLANT
DRSG AQUACEL AG ADV 3.5X10 (GAUZE/BANDAGES/DRESSINGS) ×2 IMPLANT
DRSG TEGADERM 4X4.75 (GAUZE/BANDAGES/DRESSINGS) ×2 IMPLANT
DURAPREP 26ML APPLICATOR (WOUND CARE) ×2 IMPLANT
ELECT REM PT RETURN 9FT ADLT (ELECTROSURGICAL) ×2
ELECTRODE REM PT RTRN 9FT ADLT (ELECTROSURGICAL) ×1 IMPLANT
EVACUATOR 1/8 PVC DRAIN (DRAIN) ×2 IMPLANT
FACESHIELD LNG OPTICON STERILE (SAFETY) ×10 IMPLANT
GAUZE SPONGE 2X2 8PLY STRL LF (GAUZE/BANDAGES/DRESSINGS) ×1 IMPLANT
GLOVE BIOGEL PI IND STRL 7.5 (GLOVE) ×1 IMPLANT
GLOVE BIOGEL PI IND STRL 8 (GLOVE) ×1 IMPLANT
GLOVE BIOGEL PI INDICATOR 7.5 (GLOVE) ×1
GLOVE BIOGEL PI INDICATOR 8 (GLOVE) ×1
GLOVE ECLIPSE 8.0 STRL XLNG CF (GLOVE) ×2 IMPLANT
GLOVE ORTHO TXT STRL SZ7.5 (GLOVE) ×4 IMPLANT
GOWN BRE IMP PREV XXLGXLNG (GOWN DISPOSABLE) ×4 IMPLANT
GOWN STRL NON-REIN LRG LVL3 (GOWN DISPOSABLE) ×2 IMPLANT
HANDPIECE INTERPULSE COAX TIP (DISPOSABLE) ×2
IMMOBILIZER KNEE 20 (SOFTGOODS) ×2
IMMOBILIZER KNEE 20 THIGH 36 (SOFTGOODS) IMPLANT
KIT BASIN OR (CUSTOM PROCEDURE TRAY) ×2 IMPLANT
MANIFOLD NEPTUNE II (INSTRUMENTS) ×2 IMPLANT
NDL SAFETY ECLIPSE 18X1.5 (NEEDLE) ×1 IMPLANT
NEEDLE HYPO 18GX1.5 SHARP (NEEDLE) ×2
NS IRRIG 1000ML POUR BTL (IV SOLUTION) ×4 IMPLANT
PACK TOTAL JOINT (CUSTOM PROCEDURE TRAY) ×2 IMPLANT
POSITIONER SURGICAL ARM (MISCELLANEOUS) ×2 IMPLANT
SET HNDPC FAN SPRY TIP SCT (DISPOSABLE) ×1 IMPLANT
SET PAD KNEE POSITIONER (MISCELLANEOUS) ×2 IMPLANT
SPONGE GAUZE 2X2 STER 10/PKG (GAUZE/BANDAGES/DRESSINGS) ×1
SUCTION FRAZIER 12FR DISP (SUCTIONS) ×2 IMPLANT
SUT MNCRL AB 4-0 PS2 18 (SUTURE) ×2 IMPLANT
SUT VIC AB 1 CT1 36 (SUTURE) ×4 IMPLANT
SUT VIC AB 2-0 CT1 27 (SUTURE) ×6
SUT VIC AB 2-0 CT1 TAPERPNT 27 (SUTURE) ×3 IMPLANT
SUT VLOC 180 0 24IN GS25 (SUTURE) ×1 IMPLANT
SYR 50ML LL SCALE MARK (SYRINGE) ×2 IMPLANT
TOWEL OR 17X26 10 PK STRL BLUE (TOWEL DISPOSABLE) ×4 IMPLANT
TRAY FOLEY CATH 14FRSI W/METER (CATHETERS) ×2 IMPLANT
WATER STERILE IRR 1500ML POUR (IV SOLUTION) ×2 IMPLANT
WRAP KNEE MAXI GEL POST OP (GAUZE/BANDAGES/DRESSINGS) ×2 IMPLANT

## 2011-11-21 NOTE — Op Note (Signed)
NAME:  Raymond Graham                      MEDICAL RECORD NO.:  [id number]                             FACILITY:  [facility]      PHYSICIAN:  Madlyn Frankel. Charlann Boxer, M.D.  DATE OF BIRTH:  Aug 15, 1950      DATE OF PROCEDURE:  11/21/2011                                     OPERATIVE REPORT         PREOPERATIVE DIAGNOSIS:  Left knee osteoarthritis.      POSTOPERATIVE DIAGNOSIS:  Left knee osteoarthritis.      FINDINGS:  The patient was noted to have complete loss of cartilage and   bone-on-bone arthritis with associated osteophytes in the medial and patellofemoral compartments of   the knee with a significant synovitis and associated effusion.      PROCEDURE:  Left total knee replacement.      COMPONENTS USED:  DePuy rotating platform posterior stabilized knee   system, a size 5 femur, 5 tibia, 10 mm insert, and 41 patellar   button.      SURGEON:  Madlyn Frankel. Charlann Boxer, M.D.      ASSISTANT:  Lanney Gins, PA-C.      ANESTHESIA:  Spinal.      SPECIMENS:  None.      COMPLICATION:  None.      DRAINS:  One Hemovac.  EBL: <150cc      TOURNIQUET TIME:   Total Tourniquet Time Documented: Thigh (Left) - 43 minutes .      The patient was stable to the recovery room.      INDICATION FOR PROCEDURE:  Raymond Graham is a 61 y.o. male patient of   mine.  The patient had been seen, evaluated, and treated conservatively in the   office with medication, activity modification, and injections.  The patient had   radiographic changes of bone-on-bone arthritis with endplate sclerosis and osteophytes noted.      The patient failed conservative measures including medication, injections, and activity modification, and at this point was ready for more definitive measures.   Based on the radiographic changes and failed conservative measures, the patient   decided to proceed with total knee replacement.  Risks of infection,   DVT, component failure, need for revision surgery, postop course, and   expectations were all   discussed and reviewed.  Consent was obtained for benefit of pain   relief.      PROCEDURE IN DETAIL:  The patient was brought to the operative theater.   Once adequate anesthesia, preoperative antibiotics, 2 gm of Ancef administered, the patient was positioned supine with the left thigh tourniquet placed.  The  left lower extremity was prepped and draped in sterile fashion.  A time-   out was performed identifying the patient, planned procedure, and   extremity.      The left lower extremity was placed in the [facility] leg holder.  The leg was   exsanguinated, tourniquet elevated to 250 mmHg.  A midline incision was   made followed by median parapatellar arthrotomy.  Following initial   exposure, attention was first directed to the patella.  Precut  measurement was noted to be 27 mm.  I resected down to 15-16 mm and used a   41 patellar button to restore patellar height as well as cover the cut   surface.      The lug holes were drilled and a metal shim was placed to protect the   patella from retractors and saw blades.      At this point, attention was now directed to the femur.  The femoral   canal was opened with a drill, irrigated to try to prevent fat emboli.  An   intramedullary rod was passed at 5 degrees valgus, 11 mm of bone was   resected off the distal femur.  Following this resection, the tibia was   subluxated anteriorly.  Using the extramedullary guide, 10 mm of bone was resected off   the proximal lateral tibia.  We confirmed the gap would be   stable medially and laterally with a 10 mm insert as well as confirmed   the cut was perpendicular in the coronal plane, checking with an alignment rod.      Once this was done, I sized the femur to be a size 5 in the anterior-   posterior dimension, chose a standard component based on medial and   lateral dimension.  The size 5 rotation block was then pinned in   position anterior referenced using the  C-clamp to set rotation.  The   anterior, posterior, and  chamfer cuts were made without difficulty nor   notching making certain that I was along the anterior cortex to help   with flexion gap stability.      The final box cut was made off the lateral aspect of distal femur.      At this point, the tibia was sized to be a size 5, the size 5 tray was   then pinned in position through the medial third of the tubercle,   drilled, and keel punched.  Trial reduction was now carried with a 5 femur,  5 tibia, a 10 mm insert, and the 41 patella botton.  The knee was brought to   extension, full extension with good flexion stability with the patella   tracking through the trochlea without application of pressure.  Given   all these findings, the trial components removed.  Final components were   opened and cement was mixed.  The knee was irrigated with normal saline   solution and pulse lavage.  The synovial lining was   then injected with 0.25% Marcaine with epinephrine and 1 cc of Toradol,   total of 61 cc.      The knee was irrigated.  Final implants were then cemented onto clean and   dried cut surfaces of bone with the knee brought to extension with a 10   mm trial insert.      Once the cement had fully cured, the excess cement was removed   throughout the knee.  I confirmed I was satisfied with the range of   motion and stability, and the final 10 mm PS insert was chosen.  It was   placed into the knee.      The tourniquet had been let down at 42 minutes.  No significant   hemostasis required.  The medium Hemovac drain was placed deep.  The   extensor mechanism was then reapproximated using #1 Vicryl with the knee   in flexion.  The   remaining wound was closed with 2-0  Vicryl and running 4-0 Monocryl.   The knee was cleaned, dried, dressed sterilely using Dermabond and   Aquacel dressing.  Drain site dressed separately.  The patient was then   brought to recovery room in stable  condition, tolerating the procedure   well.   Please note that Physician Assistant, Lanney Gins, PA-C, was present for the entirety of the case, and was utilized for pre-operative positioning, peri-operative retractor management, general facilitation of the procedure.  He was also utilized for primary wound closure at the end of the case.              Madlyn Frankel Charlann Boxer, M.D.

## 2011-11-21 NOTE — Interval H&P Note (Signed)
History and Physical Interval Note:  11/21/2011 12:34 PM  Raymond Graham  has presented today for surgery, with the diagnosis of left knee osteoarthritis  The various methods of treatment have been discussed with the patient and family. After consideration of risks, benefits and other options for treatment, the patient has consented to  Procedure(s) (LRB): LEFT TOTAL KNEE ARTHROPLASTY (Left) as a surgical intervention .  The patient's history has been reviewed, patient examined, no change in status, stable for surgery.  I have reviewed the patient's chart and labs.  Questions were answered to the patient's satisfaction.     Shelda Pal

## 2011-11-21 NOTE — Transfer of Care (Signed)
Immediate Anesthesia Transfer of Care Note  Patient: Raymond Graham  Procedure(s) Performed: Procedure(s) (LRB): TOTAL KNEE ARTHROPLASTY (Left)  Patient Location: PACU  Anesthesia Type: MAC and Spinal  Level of Consciousness: awake, alert , patient cooperative and responds to stimulation  Airway & Oxygen Therapy: Patient Spontanous Breathing and Patient connected to face mask oxygen  Post-op Assessment: Report given to PACU RN and Post -op Vital signs reviewed and stable  Post vital signs: Reviewed and stable  Complications: No apparent anesthesia complications

## 2011-11-21 NOTE — Preoperative (Signed)
Beta Blockers   Reason not to administer Beta Blockers:Not Applicable 

## 2011-11-21 NOTE — Anesthesia Procedure Notes (Signed)
Spinal  Patient location during procedure: OR Start time: 11/21/2011 1:20 PM End time: 11/21/2011 1:40 PM Staffing Anesthesiologist: Ronelle Nigh L Performed by: anesthesiologist  Preanesthetic Checklist Completed: patient identified, site marked, surgical consent, pre-op evaluation, timeout performed, IV checked, risks and benefits discussed and monitors and equipment checked Spinal Block Patient position: sitting Prep: Betadine Patient monitoring: heart rate, continuous pulse ox and blood pressure Approach: right paramedian Location: L2-3 Injection technique: single-shot Needle Needle type: Spinocan  Needle gauge: 22 G Needle length: 9 cm Assessment Sensory level: T6 Additional Notes Expiration date of kit checked and confirmed. Patient tolerated procedure well, without complications.

## 2011-11-21 NOTE — Anesthesia Preprocedure Evaluation (Addendum)
Anesthesia Evaluation  Patient identified by MRN, date of birth, ID band Patient awake    Reviewed: Allergy & Precautions, H&P , NPO status , Patient's Chart, lab work & pertinent test results, reviewed documented beta blocker date and time   Airway Mallampati: III TM Distance: >3 FB Neck ROM: full    Dental No notable dental hx. (+) Teeth Intact and Dental Advisory Given   Pulmonary neg pulmonary ROS,  breath sounds clear to auscultation  Pulmonary exam normal       Cardiovascular Exercise Tolerance: Good hypertension, Pt. on home beta blockers negative cardio ROS  Rhythm:regular Rate:Normal     Neuro/Psych negative neurological ROS  negative psych ROS   GI/Hepatic negative GI ROS, Neg liver ROS, GERD-  Medicated and Controlled,  Endo/Other  negative endocrine ROS  Renal/GU negative Renal ROS  negative genitourinary   Musculoskeletal   Abdominal   Peds  Hematology negative hematology ROS (+)   Anesthesia Other Findings   Reproductive/Obstetrics negative OB ROS                          Anesthesia Physical Anesthesia Plan  ASA: II  Anesthesia Plan: Spinal   Post-op Pain Management:    Induction:   Airway Management Planned:   Additional Equipment:   Intra-op Plan:   Post-operative Plan:   Informed Consent: I have reviewed the patients History and Physical, chart, labs and discussed the procedure including the risks, benefits and alternatives for the proposed anesthesia with the patient or authorized representative who has indicated his/her understanding and acceptance.   Dental Advisory Given  Plan Discussed with: CRNA and Surgeon  Anesthesia Plan Comments:       Anesthesia Quick Evaluation

## 2011-11-21 NOTE — Progress Notes (Signed)
  PHARMACIST - PHYSICIAN ORDER COMMUNICATION  CONCERNING: P&T Medication Policy on Herbal Medications  DESCRIPTION:  This patient's order for:  Lycopene  has been noted.  This product(s) is classified as an "herbal" or natural product. Due to a lack of definitive safety studies or FDA approval, nonstandard manufacturing practices, plus the potential risk of unknown drug-drug interactions while on inpatient medications, the Pharmacy and Therapeutics Committee does not permit the use of "herbal" or natural products of this type within [facility].   ACTION TAKEN: The pharmacy department is unable to verify this order at this time and your patient has been informed of this safety policy. Please reevaluate patient's clinical condition at discharge and address if the herbal or natural product(s) should be resumed at that time.  Darrol Angel, PharmD Pager: [phone] 11/21/2011 7:12 PM

## 2011-11-21 NOTE — Anesthesia Postprocedure Evaluation (Signed)
  Anesthesia Post-op Note  Patient: Raymond Graham  Procedure(s) Performed: Procedure(s) (LRB): TOTAL KNEE ARTHROPLASTY (Left)  Patient Location: PACU  Anesthesia Type: Spinal  Level of Consciousness: awake and alert   Airway and Oxygen Therapy: Patient Spontanous Breathing  Post-op Pain: mild  Post-op Assessment: Post-op Vital signs reviewed, Patient's Cardiovascular Status Stable, Respiratory Function Stable, Patent Airway and No signs of Nausea or vomiting  Post-op Vital Signs: stable  Complications: No apparent anesthesia complications

## 2011-11-22 ENCOUNTER — Encounter (HOSPITAL_COMMUNITY): Payer: Self-pay | Admitting: Orthopedic Surgery

## 2011-11-22 DIAGNOSIS — Z96659 Presence of unspecified artificial knee joint: Secondary | ICD-10-CM

## 2011-11-22 LAB — BASIC METABOLIC PANEL
CO2: 23 mEq/L (ref 19–32)
Calcium: 8.5 mg/dL (ref 8.4–10.5)
GFR calc non Af Amer: 72 mL/min — ABNORMAL LOW (ref >90)
Glucose, Bld: 188 mg/dL — ABNORMAL HIGH (ref 70–99)
Potassium: 3.9 mEq/L (ref 3.5–5.1)
Sodium: 134 mEq/L — ABNORMAL LOW (ref 135–145)

## 2011-11-22 LAB — CBC
Hemoglobin: 12.8 g/dL — ABNORMAL LOW (ref 13.0–17.0)
MCH: 28.6 pg (ref 26.0–34.0)
MCHC: 34 g/dL (ref 30.0–36.0)
Platelets: 185 10*3/uL (ref 150–400)
RBC: 4.47 MIL/uL (ref 4.22–5.81)

## 2011-11-22 MED ORDER — OXYCODONE HCL 5 MG PO TABS
5.0000 mg | ORAL_TABLET | ORAL | Status: DC | PRN
  Administered 2011-11-22 (×3): 10 mg via ORAL
  Filled 2011-11-22 (×3): qty 2

## 2011-11-22 MED ORDER — OXYCODONE HCL 5 MG PO TABS
5.0000 mg | ORAL_TABLET | ORAL | Status: DC | PRN
  Administered 2011-11-22 – 2011-11-23 (×8): 15 mg via ORAL
  Filled 2011-11-22 (×9): qty 3

## 2011-11-22 MED ORDER — KETOROLAC TROMETHAMINE 15 MG/ML IJ SOLN
15.0000 mg | Freq: Four times a day (QID) | INTRAMUSCULAR | Status: DC
  Administered 2011-11-22 – 2011-11-23 (×4): 15 mg via INTRAVENOUS
  Filled 2011-11-22 (×8): qty 1

## 2011-11-22 MED ORDER — ACETAMINOPHEN 10 MG/ML IV SOLN
1000.0000 mg | Freq: Four times a day (QID) | INTRAVENOUS | Status: AC
  Administered 2011-11-22 (×2): 1000 mg via INTRAVENOUS
  Filled 2011-11-22 (×5): qty 100

## 2011-11-22 MED ORDER — ACETAMINOPHEN 10 MG/ML IV SOLN
1000.0000 mg | Freq: Once | INTRAVENOUS | Status: AC
  Administered 2011-11-22: 1000 mg via INTRAVENOUS
  Filled 2011-11-22 (×2): qty 100

## 2011-11-22 NOTE — Evaluation (Signed)
Physical Therapy Evaluation Patient Details Name: Raymond Graham MRN: [id number] DOB: [birth year] Today's Date: 11/22/2011 Time: 4098-1191 PT Time Calculation (min): 32 min  PT Assessment / Plan / Recommendation Clinical Impression  Pt with L TKR presents with decreased L LE strength/ROM and limitations in functional mobility.    PT Assessment  Patient needs continued PT services    Follow Up Recommendations  Home health PT    Barriers to Discharge        Equipment Recommendations  None recommended by PT    Recommendations for Other Services     Frequency 7X/week    Precautions / Restrictions Precautions Precautions: Knee Restrictions Weight Bearing Restrictions: No Other Position/Activity Restrictions: WBAT   Pertinent Vitals/Pain 7-8/10; RN aware, ice packs provided      Mobility  Bed Mobility Bed Mobility: Supine to Sit Supine to Sit: 4: Min guard;4: Min assist Details for Bed Mobility Assistance: cues for sequence and use of UEs to self assist Transfers Transfers: Sit to Stand;Stand to Sit Sit to Stand: 4: Min assist Stand to Sit: 4: Min assist Details for Transfer Assistance: min cues for use of UEs and for LE management Ambulation/Gait Ambulation/Gait Assistance: 4: Min assist Ambulation Distance (Feet): 70 Feet Assistive device: Rolling walker Ambulation/Gait Assistance Details: min cues for posture, position from RW and sequence Gait Pattern: Step-to pattern    Exercises Total Joint Exercises Ankle Circles/Pumps: AROM;Both;10 reps;Supine Quad Sets: AROM;Both;10 reps;Supine Heel Slides: AAROM;10 reps;Supine;Left Straight Leg Raises: AAROM;Left;10 reps;Supine   PT Diagnosis: Difficulty walking  PT Problem List: Decreased range of motion;Decreased strength;Decreased activity tolerance;Decreased mobility;Decreased knowledge of use of DME;Pain PT Treatment Interventions: DME instruction;Gait training;Stair training;Functional mobility  training;Therapeutic activities;Therapeutic exercise;Patient/family education   PT Goals Acute Rehab PT Goals PT Goal Formulation: With patient Time For Goal Achievement: 11/28/11 Potential to Achieve Goals: Good Pt will go Supine/Side to Sit: with supervision PT Goal: Supine/Side to Sit - Progress: Goal set today Pt will go Sit to Supine/Side: with supervision PT Goal: Sit to Supine/Side - Progress: Goal set today Pt will go Sit to Stand: with supervision PT Goal: Sit to Stand - Progress: Goal set today Pt will go Stand to Sit: with supervision PT Goal: Stand to Sit - Progress: Goal set today Pt will Ambulate: 51 - 150 feet;with supervision;with rolling walker PT Goal: Ambulate - Progress: Goal set today Pt will Go Up / Down Stairs: 3-5 stairs;with min assist;with least restrictive assistive device PT Goal: Up/Down Stairs - Progress: Goal set today  Visit Information  Last PT Received On: 11/22/11 Assistance Needed: +1    Subjective Data  Subjective: I just had my L hip done earlier this year Patient Stated Goal: Resume previous lifestyle with decreased pain   Prior Functioning  Home Living Lives With: Spouse Available Help at Discharge: Family Type of Home: House Home Access: Stairs to enter Secretary/administrator of Steps: 4 Entrance Stairs-Rails: None Home Adaptive Equipment: Walker - rolling Prior Function Level of Independence: Independent Able to Take Stairs?: Yes Driving: Yes Communication Communication: No difficulties    Cognition  Overall Cognitive Status: Appears within functional limits for tasks assessed/performed Arousal/Alertness: Awake/alert Orientation Level: Appears intact for tasks assessed Behavior During Session: [facility] for tasks performed    Extremity/Trunk Assessment Right Upper Extremity Assessment RUE ROM/Strength/Tone: [facility] for tasks assessed Left Upper Extremity Assessment LUE ROM/Strength/Tone: [facility] for tasks assessed Right Lower Extremity  Assessment RLE ROM/Strength/Tone: [facility] for tasks assessed Left Lower Extremity Assessment LLE ROM/Strength/Tone: Deficits LLE ROM/Strength/Tone Deficits: AAROM  at knee -10 - 70 with 3-/5 quads   Balance    End of Session PT - End of Session Activity Tolerance: Patient tolerated treatment well Patient left: in chair;with call bell/phone within reach Nurse Communication: Mobility status;Patient requests pain meds  GP     Aviv Lengacher 11/22/2011, 11:25 AM

## 2011-11-22 NOTE — Care Management Note (Signed)
    Page 1 of 2   11/23/2011     1:27:01 PM   CARE MANAGEMENT NOTE 11/23/2011  Patient:  FILLMORE, BYNUM   Account Number:  [id number]  Date Initiated:  11/22/2011  Documentation initiated by:  Colleen Can  Subjective/Objective Assessment:   DX LEFT KNEE OSTEOARTHRITIS; TOTAL KNEE REPLACEMNT     Action/Plan:   CM spoke with patient. Plans are for patient to return to his home in [place] where spouse will be caregiver. States he already has RW and other DME from previous surgery. Has used [facility] in the past & wishes to use this time   Anticipated DC Date:  11/24/2011   Anticipated DC Plan:  HOME W HOME HEALTH SERVICES  In-house referral  NA      DC Planning Services  CM consult      [facility] Choice  HOME HEALTH   Choice offered to / List presented to:  C-1 Patient   DME arranged  NA      DME agency  NA     HH arranged  HH-2 PT      [facility] agency  [facility]   Status of service:  Completed, signed off Medicare Important Message given?  NA - LOS <3 / Initial given by admissions (If response is "NO", the following Medicare IM given date fields will be blank) Date Medicare IM given:   Date Additional Medicare IM given:    Discharge Disposition:  HOME W HOME HEALTH SERVICES  Per UR Regulation:    If discussed at Long Length of Stay Meetings, dates discussed:    Comments:  11/23/2011 Raynelle Bring BSN CCM [phone] Pt for discharge today with [facility] services in place which will start tomorrow 11/24/2011

## 2011-11-22 NOTE — Progress Notes (Signed)
   Subjective: 1 Day Post-Op Procedure(s) (LRB): TOTAL KNEE ARTHROPLASTY (Left)   Patient reports pain as moderate, not well controlled last night. Otherwise no events.  Objective:   VITALS:   Filed Vitals:   11/22/11 0419  BP: 110/55  Pulse: 74  Temp: 98.1 F (36.7 C)  Resp: 14    Neurovascular intact Dorsiflexion/Plantar flexion intact Incision: dressing C/D/I No cellulitis present Compartment soft  LABS  Basename 11/22/11 0424  HGB 12.8*  HCT 37.6*  WBC 9.4  PLT 185     Basename 11/22/11 0424  NA 134*  K 3.9  BUN 16  CREATININE 1.07  GLUCOSE 188*     Assessment/Plan: 1 Day Post-Op Procedure(s) (LRB): TOTAL KNEE ARTHROPLASTY (Left)   HV drain d/c'ed Foley cath d/c'ed Will reevaluate analgesic medications Add toradol Advance diet Up with therapy D/C IV fluids Discharge home with home health eventually, when ready   Raymond Graham. Raymond Graham   PAC  11/22/2011, 8:33 AM

## 2011-11-22 NOTE — Progress Notes (Signed)
11/22/11 0038 Nursing Raymond Graham called reg pt co of pain with norco. Pt asking to switch to oxycocone. Order received.

## 2011-11-22 NOTE — Progress Notes (Signed)
UR COMPLETED  

## 2011-11-22 NOTE — Progress Notes (Signed)
OT Screen Order received, chart reviewed. Spoke briefly with pt who had B hips replaced in the past. Pt has all necessary DME and will have prn A upon return home. Pt presents with no OT needs at this time. Will sign off.  Garrel Ridgel, OTR/L  Pager [phone] 11/22/2011

## 2011-11-22 NOTE — Progress Notes (Signed)
Physical Therapy Treatment Patient Details Name: Raymond Graham MRN: [id number] DOB: 1951-03-18 Today's Date: 11/22/2011 Time: 4098-1191 PT Time Calculation (min): 25 min  PT Assessment / Plan / Recommendation Comments on Treatment Session       Follow Up Recommendations  Home health PT    Barriers to Discharge        Equipment Recommendations  None recommended by PT    Recommendations for Other Services    Frequency 7X/week   Plan Discharge plan remains appropriate    Precautions / Restrictions Precautions Precautions: Knee Required Braces or Orthoses: Knee Immobilizer - Left Knee Immobilizer - Left: Discontinue once straight leg raise with < 10 degree lag Restrictions Weight Bearing Restrictions: No Other Position/Activity Restrictions: WBAT   Pertinent Vitals/Pain     Mobility  Bed Mobility Bed Mobility: Sit to Supine Sit to Supine: 4: Min assist Details for Bed Mobility Assistance: cues for sequence and use of UEs to self assist Transfers Transfers: Sit to Stand;Stand to Sit Sit to Stand: 4: Min assist Stand to Sit: 4: Min assist Details for Transfer Assistance: min cues for use of UEs and for LE management Ambulation/Gait Ambulation/Gait Assistance: 4: Min assist Ambulation Distance (Feet): 175 Feet Assistive device: Rolling walker Ambulation/Gait Assistance Details: cues for sequence, posture and position from RW Gait Pattern: Step-to pattern Gait velocity: slow but steady    Exercises     PT Diagnosis:    PT Problem List:   PT Treatment Interventions:     PT Goals Acute Rehab PT Goals PT Goal Formulation: With patient Time For Goal Achievement: 11/28/11 Potential to Achieve Goals: Good Pt will go Supine/Side to Sit: with supervision PT Goal: Supine/Side to Sit - Progress: Goal set today Pt will go Sit to Supine/Side: with supervision PT Goal: Sit to Supine/Side - Progress: Goal set today Pt will go Sit to Stand: with supervision PT Goal: Sit  to Stand - Progress: Progressing toward goal Pt will go Stand to Sit: with supervision PT Goal: Stand to Sit - Progress: Progressing toward goal Pt will Ambulate: 51 - 150 feet;with supervision;with rolling walker PT Goal: Ambulate - Progress: Progressing toward goal Pt will Go Up / Down Stairs: 3-5 stairs;with min assist;with least restrictive assistive device PT Goal: Up/Down Stairs - Progress: Goal set today  Visit Information  Last PT Received On: 11/22/11 Assistance Needed: +1    Subjective Data  Patient Stated Goal: Resume previous lifestyle with decreased pain   Cognition  Overall Cognitive Status: Appears within functional limits for tasks assessed/performed Arousal/Alertness: Awake/alert Orientation Level: Appears intact for tasks assessed Behavior During Session: [facility] for tasks performed    Balance     End of Session PT - End of Session Equipment Utilized During Treatment: Left knee immobilizer Activity Tolerance: Patient tolerated treatment well Patient left: in bed;with call bell/phone within reach Nurse Communication: Mobility status   GP     Mackie Holness 11/22/2011, 2:55 PM

## 2011-11-23 LAB — BASIC METABOLIC PANEL
CO2: 26 mEq/L (ref 19–32)
Chloride: 103 mEq/L (ref 96–112)
GFR calc Af Amer: 84 mL/min — ABNORMAL LOW (ref >90)
Potassium: 3.9 mEq/L (ref 3.5–5.1)
Sodium: 138 mEq/L (ref 135–145)

## 2011-11-23 LAB — CBC
MCV: 85.3 fL (ref 78.0–100.0)
Platelets: 194 10*3/uL (ref 150–400)
RBC: 4.15 MIL/uL — ABNORMAL LOW (ref 4.22–5.81)
RDW: 14.3 % (ref 11.5–15.5)
WBC: 14.2 10*3/uL — ABNORMAL HIGH (ref 4.0–10.5)

## 2011-11-23 MED ORDER — ASPIRIN EC 325 MG PO TBEC: 325.0000 mg | DELAYED_RELEASE_TABLET | Freq: Two times a day (BID) | ORAL | Status: AC

## 2011-11-23 MED ORDER — FERROUS SULFATE 325 (65 FE) MG PO TABS: 325.0000 mg | ORAL_TABLET | Freq: Three times a day (TID) | ORAL | Status: DC

## 2011-11-23 MED ORDER — DSS 100 MG PO CAPS: 100.0000 mg | ORAL_CAPSULE | Freq: Two times a day (BID) | ORAL | Status: AC

## 2011-11-23 MED ORDER — POLYETHYLENE GLYCOL 3350 17 G PO PACK: 17.0000 g | PACK | Freq: Two times a day (BID) | ORAL | Status: AC

## 2011-11-23 MED ORDER — METHOCARBAMOL 500 MG PO TABS: 500.0000 mg | ORAL_TABLET | Freq: Four times a day (QID) | ORAL | Status: AC | PRN

## 2011-11-23 MED ORDER — OXYCODONE HCL 5 MG PO TABS: 5.0000 mg | ORAL_TABLET | ORAL | Status: AC | PRN

## 2011-11-23 NOTE — Progress Notes (Signed)
Physical Therapy Treatment Patient Details Name: Raymond Graham MRN: [id number] DOB: 1950/05/20 Today's Date: 11/23/2011 Time: 4098-1191 PT Time Calculation (min): 35 min  PT Assessment / Plan / Recommendation Comments on Treatment Session  POD #2 D/C to home today.  Practiced up stairs backward 2nd no rails.  Handouts given on stairs and HEP. Instructed on use of ICE and proper extension positioning L LE while @ rest.    Follow Up Recommendations  Home health PT    Barriers to Discharge        Equipment Recommendations  None recommended by PT    Recommendations for Other Services    Frequency     Plan Discharge plan remains appropriate    Precautions / Restrictions Precautions Precautions: Knee Precaution Comments: Pt instructed on KI use and when to D/C Knee Immobilizer - Left: Discontinue once straight leg raise with < 10 degree lag Restrictions Weight Bearing Restrictions: No Other Position/Activity Restrictions: WBAT    Pertinent Vitals/Pain C/o "soreness", "tightness" Applied ICE   Mobility  Bed Mobility Bed Mobility: Not assessed Details for Bed Mobility Assistance: Pt OOB in recliner  Transfers Transfers: Sit to Stand;Stand to Sit Sit to Stand: 6: Modified independent (Device/Increase time);From chair/3-in-1 Stand to Sit: 6: Modified independent (Device/Increase time);To chair/3-in-1 Details for Transfer Assistance: increased time and one VC to extend L LE prior to sit  Ambulation/Gait Ambulation/Gait Assistance: 5: Supervision Ambulation Distance (Feet): 225 Feet Assistive device: Rolling walker Ambulation/Gait Assistance Details: increased time and one VC on backward gait sequencing and one VC to ABD B LE to increase BOS Gait Pattern: Step-through pattern;Decreased stance time - left;Narrow base of support Gait velocity: decreased  Stairs: Yes Stairs Assistance: 4: Min assist Stairs Assistance Details (indicate cue type and reason): 25% VC's on proper  technique and handout given for pt education Stair Management Technique: No rails;Backwards;With walker Number of Stairs: 4  (performed twice)     PT Goals                         progressing    Visit Information  Last PT Received On: 11/23/11 Assistance Needed: +1    Subjective Data   "I am ready to go"   Cognition    good   Balance   fair +  End of Session PT - End of Session Equipment Utilized During Treatment: Gait belt Activity Tolerance: Patient tolerated treatment well Patient left: in chair;with call bell/phone within reach Nurse Communication: Other (comment) (Pt ready for D/C to home)   Felecia Shelling  PTA [facility]  Acute  Rehab Pager     [phone]

## 2011-11-23 NOTE — Discharge Summary (Signed)
Physician Discharge Summary  Patient ID: Raymond Graham MRN: [id number] DOB/AGE: 10-11-1950 61 y.o.  Admit date: 11/21/2011 Discharge date: 11/23/2011   Procedures:  Procedure(s) (LRB): TOTAL KNEE ARTHROPLASTY (Left)  Attending Physician:  Dr. 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 After discussion of treatment, benefits, risks, and options, Raymond patient is now scheduled for total knee arthroplasty of Raymond left knee. Note that he is a candidate for tranexamic acid and dexamethasone, and will receive both at surgery. His medical doctor is Dr. Dorothyann Peng. He does plan on going home after surgery, and he was given his home medications of aspirin, Robaxin, iron, MiraLax, and Colace. Raymond surgery risks, benefits, and aftercare were discussed in detail with Raymond patient. Questions invited and answered.  PCP: Gwynneth Aliment, MD   Discharged Condition: good  Hospital Course:  Patient underwent Raymond above stated procedure on 11/21/2011. Patient tolerated Raymond procedure well and brought to Raymond recovery room in good condition and subsequently to Raymond floor.  POD #1 BP: 110/55 ; Pulse: 74 ; Temp: 98.1 F (36.7 C) ; Resp: 14 Pt's foley was removed, as well as Raymond hemovac drain removed. IV was changed to a saline lock. Patient reports pain as moderate, not well controlled last night. Otherwise no events. Changed to Oxycodone from Norco. Neurovascular intact, dorsiflexion/plantar flexion intact, incision: dressing C/D/I, no cellulitis present and compartment soft.   LABS  Basename  11/22/11 0424   HGB  12.8  HCT  37.6    POD #2  BP: 150/80 ; Pulse: 66 ; Temp: 98.2 F (36.8 C) ; Resp: 14  Patient  reports pain as mild, pain controlled. No events throughout Raymond night. Ready to be discharged home. Neurovascular intact, dorsiflexion/plantar flexion intact, incision: dressing C/D/I, no cellulitis present and compartment soft.   LABS  Basename  11/23/11 0435   HGB  12.0  HCT  35.4    Discharge Exam: General appearance: alert, cooperative and no distress Extremities: Homans sign is negative, no sign of DVT, no edema, redness or tenderness in Raymond calves or thighs and no ulcers, gangrene or trophic changes  Disposition:  Home or Self Care with follow up in 2 weeks   Follow-up Information    Follow up with Shelda Pal, MD in 2 weeks.   Contact information:   [facility] [address] [place] [address] [phone]          Discharge Orders    Future Orders Please Complete By Expires   Diet - low sodium heart healthy      Call MD / Call 911      Comments:   If you experience chest pain or shortness of breath, CALL 911 and be transported to Raymond hospital emergency room.  If you develope a fever above 101 F, pus (white drainage) or increased drainage or redness at Raymond wound, or calf pain, call your surgeon's office.   Discharge instructions      Comments:   Maintain surgical dressing for 8 days, then replace with gauze and tape. Keep Raymond area dry and clean until follow up. Follow up in 2 weeks at [facility]. Call with any questions or concerns.   Constipation  Prevention      Comments:   Drink plenty of fluids.  Prune juice may be helpful.  You may use a stool softener, such as Colace (over Raymond counter) 100 mg twice a day.  Use MiraLax (over Raymond counter) for constipation as needed.   Increase activity slowly as tolerated      Driving restrictions      Comments:   No driving for 4 weeks   TED hose      Comments:   Use stockings (TED hose) for 2 weeks on both leg(s).  You may remove them at night for sleeping.    Change dressing      Comments:   Maintain surgical dressing for 8 days, then change Raymond dressing daily with sterile 4 x 4 inch gauze dressing and tape. Keep Raymond area dry and clean.      Discharge Medication List as of 11/23/2011 11:14 AM    START taking these medications   Details  aspirin EC 325 MG tablet Take 1 tablet (325 mg total) by mouth 2 (two) times daily. X 4 weeks, Starting 11/23/2011, Until Sat 12/03/11, No Print    docusate sodium 100 MG CAPS Take 100 mg by mouth 2 (two) times daily., Starting 11/23/2011, Until Sat 12/03/11, No Print    methocarbamol (ROBAXIN) 500 MG tablet Take 1 tablet (500 mg total) by mouth every 6 (six) hours as needed (muscle spasms)., Starting 11/23/2011, Until Sat 12/03/11, No Print    oxyCODONE (OXY IR/ROXICODONE) 5 MG immediate release tablet Take 1-3 tablets (5-15 mg total) by mouth every 4 (four) hours as needed for pain., Starting 11/23/2011, Until Sat 12/03/11, Print    polyethylene glycol (MIRALAX / GLYCOLAX) packet Take 17 g by mouth 2 (two) times daily., Starting 11/23/2011, Until Sat 11/26/11, No Print      CONTINUE these medications which have CHANGED   Details  ferrous sulfate 325 (65 FE) MG tablet Take 1 tablet (325 mg total) by mouth 3 (three) times daily after meals., Starting 11/23/2011, Until Thu 11/22/12, No Print      CONTINUE these medications which have NOT CHANGED   Details  Cholecalciferol (VITAMIN D) 2000 UNITS CAPS Take 4,000 Units by mouth daily., Until Discontinued, Historical Med    fish oil-omega-3 fatty acids 1000 MG capsule Take 2 g by mouth daily., Until Discontinued, Historical Med    losartan-hydrochlorothiazide (HYZAAR) 50-12.5 MG per tablet Take 1 tablet by mouth every morning., Until Discontinued, Historical Med    Lycopene 10 MG CAPS Take 1 capsule by mouth daily., Until Discontinued, Historical Med    metoprolol succinate (TOPROL-XL) 100 MG 24 hr tablet Take 100 mg by mouth daily after breakfast. Take with or  immediately following a meal., Until Discontinued, Historical Med    mometasone (NASONEX) 50 MCG/ACT nasal spray Place 2 sprays into Raymond nose daily as needed. For congestion, Until Discontinued, Historical Med    Multiple Vitamin (MULTIVITAMIN WITH MINERALS) TABS Take 1 tablet by mouth daily., Until Discontinued, Historical Med    Tamsulosin HCl (FLOMAX) 0.4 MG CAPS Take 0.4 mg by mouth daily after breakfast., Until Discontinued, Historical Med    PRESCRIPTION MEDICATION Inject 0.08-0.5 mg into Raymond skin 3 (three) times a week. Monday, wednesday, and Friday. Pt increase as he uses Raymond bottle ALLERY SHOTS, Until Discontinued, Historical Med         Signed: Anastasio Auerbach. Mekenzie Modeste   PAC  11/23/2011, 2:26 PM

## 2011-11-23 NOTE — Progress Notes (Signed)
   Subjective: 2 Days Post-Op Procedure(s) (LRB): TOTAL KNEE ARTHROPLASTY (Left)   Patient reports pain as mild, pain controlled. No events throughout the night. Ready to be discharged home.  Objective:   VITALS:   Filed Vitals:   11/23/11 0546  BP: 150/80  Pulse: 66  Temp: 98.2 F (36.8 C)  Resp: 14    Neurovascular intact Dorsiflexion/Plantar flexion intact Incision: dressing C/D/I No cellulitis present Compartment soft  LABS  Basename 11/23/11 0435 11/22/11 0424  HGB 12.0* 12.8*  HCT 35.4* 37.6*  WBC 14.2* 9.4  PLT 194 185     Basename 11/23/11 0435 11/22/11 0424  NA 138 134*  K 3.9 3.9  BUN 18 16  CREATININE 1.06 1.07  GLUCOSE 146* 188*     Assessment/Plan: 2 Days Post-Op Procedure(s) (LRB): TOTAL KNEE ARTHROPLASTY (Left)   Up with therapy Discharge home with home health Follow up in 2 weeks at [facility].  Follow-up Information    Follow up with OLIN,Michaelann Gunnoe D in 2 weeks.   Contact information:   [facility] [address] [place] [address] [phone]           Anastasio Auerbach. Nashae Maudlin   PAC  11/23/2011, 8:24 AM

## 2014-06-24 ENCOUNTER — Ambulatory Visit (INDEPENDENT_AMBULATORY_CARE_PROVIDER_SITE_OTHER): Payer: Medicare Other | Admitting: Emergency Medicine

## 2014-06-24 VITALS — BP 172/94 | HR 57 | Temp 97.9°F | Resp 20 | Ht 67.75 in | Wt 206.6 lb

## 2014-06-24 DIAGNOSIS — M5430 Sciatica, unspecified side: Secondary | ICD-10-CM | POA: Diagnosis not present

## 2014-06-24 MED ORDER — CYCLOBENZAPRINE HCL 10 MG PO TABS: 10.0000 mg | ORAL_TABLET | Freq: Three times a day (TID) | ORAL | Status: DC | PRN

## 2014-06-24 MED ORDER — NAPROXEN SODIUM 550 MG PO TABS: 550.0000 mg | ORAL_TABLET | Freq: Two times a day (BID) | ORAL | Status: AC

## 2014-06-24 MED ORDER — TRAMADOL HCL 50 MG PO TABS: 50.0000 mg | ORAL_TABLET | Freq: Three times a day (TID) | ORAL | Status: DC | PRN

## 2014-06-24 NOTE — Patient Instructions (Signed)
Sciatica Sciatica is pain, weakness, numbness, or tingling along the path of the sciatic nerve. The nerve starts in the lower back and runs down the back of each leg. The nerve controls the muscles in the lower leg and in the back of the knee, while also providing sensation to the back of the thigh, lower leg, and the sole of your foot. Sciatica is a symptom of another medical condition. For instance, nerve damage or certain conditions, such as a herniated disk or bone spur on the spine, pinch or put pressure on the sciatic nerve. This causes the pain, weakness, or other sensations normally associated with sciatica. Generally, sciatica only affects one side of the body. CAUSES   Herniated or slipped disc.  Degenerative disk disease.  A pain disorder involving the narrow muscle in the buttocks (piriformis syndrome).  Pelvic injury or fracture.  Pregnancy.  Tumor (rare). SYMPTOMS  Symptoms can vary from mild to very severe. The symptoms usually travel from the low back to the buttocks and down the back of the leg. Symptoms can include:  Mild tingling or dull aches in the lower back, leg, or hip.  Numbness in the back of the calf or sole of the foot.  Burning sensations in the lower back, leg, or hip.  Sharp pains in the lower back, leg, or hip.  Leg weakness.  Severe back pain inhibiting movement. These symptoms may get worse with coughing, sneezing, laughing, or prolonged sitting or standing. Also, being overweight may worsen symptoms. DIAGNOSIS  Your caregiver will perform a physical exam to look for common symptoms of sciatica. He or she may ask you to do certain movements or activities that would trigger sciatic nerve pain. Other tests may be performed to find the cause of the sciatica. These may include:  Blood tests.  X-rays.  Imaging tests, such as an MRI or CT scan. TREATMENT  Treatment is directed at the cause of the sciatic pain. Sometimes, treatment is not necessary  and the pain and discomfort goes away on its own. If treatment is needed, your caregiver may suggest:  Over-the-counter medicines to relieve pain.  Prescription medicines, such as anti-inflammatory medicine, muscle relaxants, or narcotics.  Applying heat or ice to the painful area.  Steroid injections to lessen pain, irritation, and inflammation around the nerve.  Reducing activity during periods of pain.  Exercising and stretching to strengthen your abdomen and improve flexibility of your spine. Your caregiver may suggest losing weight if the extra weight makes the back pain worse.  Physical therapy.  Surgery to eliminate what is pressing or pinching the nerve, such as a bone spur or part of a herniated disk. HOME CARE INSTRUCTIONS   Only take over-the-counter or prescription medicines for pain or discomfort as directed by your caregiver.  Apply ice to the affected area for 20 minutes, 3-4 times a day for the first 48-72 hours. Then try heat in the same way.  Exercise, stretch, or perform your usual activities if these do not aggravate your pain.  Attend physical therapy sessions as directed by your caregiver.  Keep all follow-up appointments as directed by your caregiver.  Do not wear high heels or shoes that do not provide proper support.  Check your mattress to see if it is too soft. A firm mattress may lessen your pain and discomfort. SEEK IMMEDIATE MEDICAL CARE IF:   You lose control of your bowel or bladder (incontinence).  You have increasing weakness in the lower back, pelvis, buttocks,   or legs.  You have redness or swelling of your back.  You have a burning sensation when you urinate.  You have pain that gets worse when you lie down or awakens you at night.  Your pain is worse than you have experienced in the past.  Your pain is lasting longer than 4 weeks.  You are suddenly losing weight without reason. MAKE SURE YOU:  Understand these  instructions.  Will watch your condition.  Will get help right away if you are not doing well or get worse. Document Released: 03/22/2001 Document Revised: 09/27/2011 Document Reviewed: 08/07/2011 ExitCare Patient Information 2015 [employer]. This information is not intended to replace advice given to you by your health care provider. Make sure you discuss any questions you have with your health care provider.  

## 2014-06-24 NOTE — Progress Notes (Signed)
[facility] [address], [place] [id number] [phone]  Date:  06/24/2014   Name:  Raymond Graham Gindlesperger   DOB:  08-31-1950   MRN:  [id number]  PCP:  Gwynneth AlimentSANDERS,ROBYN N, MD    Chief Complaint: Leg Pain; Back Pain; Leg Swelling; and Hypertension   History of Present Illness:  Raymond Graham Lesesne is a 64 y.o. very pleasant male patient who presents with the following:  Concerned that he has pain in the posterior thighs with sitting and walking. History of surgical treatment of spinal stenosis. No numbness or tingling associated with this pain.   Says it is like a ham string pull Present for a couple wseeks Less when lays down No improvement with topical agents, TENS, OTC meds Says has some swelling in legs and feet. No history of injury or overuse. No improvement with over the counter medications or other home remedies.  Denies other complaint or health concern today.   Patient Active Problem List   Diagnosis Date Noted  . Graham/P left TKA 11/22/2011  . Graham/P left THA, AA 05/31/2011    Past Medical History  Diagnosis Date  . Hypertension   . Arthritis     PAIN AND ARTHRITIS LEFT HIP AND LEFT KNEE  . BPH (benign prostatic hypertrophy)     PT ON FLOMAX-DR. WRENN IS PT'Graham UROLOGIST  . Seasonal allergies   . GERD (gastroesophageal reflux disease)     OCAS-NO MEDS    Past Surgical History  Procedure Laterality Date  . Back surgery  10/2008    LUMBAR SURGERY FOR STENOSIS  . Joint replacement  2005    RIGHT HIP REPLACEMENT   . Left ganglion cyst removed    . Left bunionectomy    . Total hip arthroplasty  05/31/2011    Procedure: TOTAL HIP ARTHROPLASTY ANTERIOR APPROACH;  Surgeon: Shelda PalMatthew D Olin, MD;  Location: [facility];  Service: Orthopedics;  Laterality: Left;  . Total knee arthroplasty  11/21/2011    Procedure: TOTAL KNEE ARTHROPLASTY;  Surgeon: Shelda PalMatthew D Olin, MD;  Location: [facility];  Service: Orthopedics;  Laterality: Left;    History  Substance Use Topics   . Smoking status: Former Smoker    Quit date: 04/12/1991  . Smokeless tobacco: Never Used     Comment: QUIT SMOKING ABOUT 15 OR 20 YRS AGO-ONLY SMOKED ON WEEKENDS  . Alcohol Use: 1.2 oz/week    2 Shots of liquor per week    History reviewed. No pertinent family history.  No Known Allergies  Medication list has been reviewed and updated.  Current Outpatient Prescriptions on File Prior to Visit  Medication Sig Dispense Refill  . Cholecalciferol (VITAMIN D) 2000 UNITS CAPS Take 4,000 Units by mouth daily.    . fish oil-omega-3 fatty acids 1000 MG capsule Take 2 g by mouth daily.    Marland Kitchen. losartan-hydrochlorothiazide (HYZAAR) 50-12.5 MG per tablet Take 1 tablet by mouth every morning.    . metoprolol succinate (TOPROL-XL) 100 MG 24 hr tablet Take 100 mg by mouth daily after breakfast. Take with or immediately following a meal.    . mometasone (NASONEX) 50 MCG/ACT nasal spray Place 2 sprays into the nose daily as needed. For congestion    . Multiple Vitamin (MULTIVITAMIN WITH MINERALS) TABS Take 1 tablet by mouth daily.    Marland Kitchen. PRESCRIPTION MEDICATION Inject 0.08-0.5 mg into the skin 3 (three) times a week. Monday, wednesday, and Friday. Pt increase as he uses the bottle ALLERY SHOTS    .  Tamsulosin HCl (FLOMAX) 0.4 MG CAPS Take 0.4 mg by mouth daily after breakfast.    . ferrous sulfate 325 (65 FE) MG tablet Take 1 tablet (325 mg total) by mouth 3 (three) times daily after meals.    . Lycopene 10 MG CAPS Take 1 capsule by mouth daily.     No current facility-administered medications on file prior to visit.    Review of Systems:  As per HPI, otherwise negative.    Physical Examination: Filed Vitals:   06/24/14 1338  BP: 172/94  Pulse: 57  Temp: 97.9 F (36.6 C)  Resp: 20   Filed Vitals:   06/24/14 1338  Height: 5' 7.75" (1.721 m)  Weight: 206 lb 9.6 oz (93.713 kg)   Body mass index is 31.64 kg/(m^2). Ideal Body Weight: Weight in (lb) to have BMI = 25: 162.9  GEN: WDWN, NAD,  Non-toxic, A & O x 3 HEENT: Atraumatic, Normocephalic. Neck supple. No masses, No LAD. Ears and Nose: No external deformity. CV: RRR, No M/G/R. No JVD. No thrill. No extra heart sounds. PULM: CTA B, no wheezes, crackles, rhonchi. No retractions. No resp. distress. No accessory muscle use. ABD: Graham, NT, ND, +BS. No rebound. No HSM. EXTR: No c/c/e NEURO Normal gait.  PSYCH: Normally interactive. Conversant. Not depressed or anxious appearing.  Calm demeanor.  Gross motor and sensory lowers intact   Assessment and Plan: Sciatic neuritis Anaprox Flexeril Ultram  Signed,  Phillips Odor, MD

## 2015-03-03 DIAGNOSIS — M25519 Pain in unspecified shoulder: Secondary | ICD-10-CM | POA: Diagnosis not present

## 2015-03-03 DIAGNOSIS — N400 Enlarged prostate without lower urinary tract symptoms: Secondary | ICD-10-CM | POA: Diagnosis not present

## 2015-03-03 DIAGNOSIS — I10 Essential (primary) hypertension: Secondary | ICD-10-CM | POA: Diagnosis not present

## 2015-03-03 DIAGNOSIS — M79609 Pain in unspecified limb: Secondary | ICD-10-CM | POA: Diagnosis not present

## 2015-04-02 DIAGNOSIS — I10 Essential (primary) hypertension: Secondary | ICD-10-CM | POA: Diagnosis not present

## 2015-04-02 DIAGNOSIS — R1013 Epigastric pain: Secondary | ICD-10-CM | POA: Diagnosis not present

## 2015-04-02 DIAGNOSIS — M79609 Pain in unspecified limb: Secondary | ICD-10-CM | POA: Diagnosis not present

## 2015-04-02 DIAGNOSIS — G4700 Insomnia, unspecified: Secondary | ICD-10-CM | POA: Diagnosis not present

## 2015-05-07 DIAGNOSIS — Z79899 Other long term (current) drug therapy: Secondary | ICD-10-CM | POA: Diagnosis not present

## 2015-05-07 DIAGNOSIS — I10 Essential (primary) hypertension: Secondary | ICD-10-CM | POA: Diagnosis not present

## 2015-05-07 DIAGNOSIS — R1013 Epigastric pain: Secondary | ICD-10-CM | POA: Diagnosis not present

## 2015-05-07 DIAGNOSIS — K5900 Constipation, unspecified: Secondary | ICD-10-CM | POA: Diagnosis not present

## 2015-06-04 DIAGNOSIS — M79609 Pain in unspecified limb: Secondary | ICD-10-CM | POA: Diagnosis not present

## 2015-06-04 DIAGNOSIS — J310 Chronic rhinitis: Secondary | ICD-10-CM | POA: Diagnosis not present

## 2015-06-04 DIAGNOSIS — Z79899 Other long term (current) drug therapy: Secondary | ICD-10-CM | POA: Diagnosis not present

## 2015-06-04 DIAGNOSIS — I10 Essential (primary) hypertension: Secondary | ICD-10-CM | POA: Diagnosis not present

## 2015-06-15 DIAGNOSIS — I10 Essential (primary) hypertension: Secondary | ICD-10-CM | POA: Diagnosis not present

## 2015-06-15 DIAGNOSIS — A09 Infectious gastroenteritis and colitis, unspecified: Secondary | ICD-10-CM | POA: Diagnosis not present

## 2015-06-15 DIAGNOSIS — Z79899 Other long term (current) drug therapy: Secondary | ICD-10-CM | POA: Diagnosis not present

## 2015-06-15 DIAGNOSIS — R5383 Other fatigue: Secondary | ICD-10-CM | POA: Diagnosis not present

## 2015-06-23 DIAGNOSIS — H1013 Acute atopic conjunctivitis, bilateral: Secondary | ICD-10-CM | POA: Diagnosis not present

## 2015-06-23 DIAGNOSIS — H40033 Anatomical narrow angle, bilateral: Secondary | ICD-10-CM | POA: Diagnosis not present

## 2015-07-02 DIAGNOSIS — G4700 Insomnia, unspecified: Secondary | ICD-10-CM | POA: Diagnosis not present

## 2015-07-02 DIAGNOSIS — I10 Essential (primary) hypertension: Secondary | ICD-10-CM | POA: Diagnosis not present

## 2015-07-02 DIAGNOSIS — E7800 Pure hypercholesterolemia, unspecified: Secondary | ICD-10-CM | POA: Diagnosis not present

## 2015-07-02 DIAGNOSIS — R5383 Other fatigue: Secondary | ICD-10-CM | POA: Diagnosis not present

## 2015-08-13 DIAGNOSIS — I10 Essential (primary) hypertension: Secondary | ICD-10-CM | POA: Diagnosis not present

## 2015-08-13 DIAGNOSIS — G4700 Insomnia, unspecified: Secondary | ICD-10-CM | POA: Diagnosis not present

## 2015-08-13 DIAGNOSIS — N400 Enlarged prostate without lower urinary tract symptoms: Secondary | ICD-10-CM | POA: Diagnosis not present

## 2015-08-13 DIAGNOSIS — E7800 Pure hypercholesterolemia, unspecified: Secondary | ICD-10-CM | POA: Diagnosis not present

## 2015-09-17 DIAGNOSIS — L309 Dermatitis, unspecified: Secondary | ICD-10-CM | POA: Diagnosis not present

## 2015-09-17 DIAGNOSIS — I10 Essential (primary) hypertension: Secondary | ICD-10-CM | POA: Diagnosis not present

## 2015-09-17 DIAGNOSIS — Z79899 Other long term (current) drug therapy: Secondary | ICD-10-CM | POA: Diagnosis not present

## 2015-09-17 DIAGNOSIS — E7800 Pure hypercholesterolemia, unspecified: Secondary | ICD-10-CM | POA: Diagnosis not present

## 2015-10-15 DIAGNOSIS — E7800 Pure hypercholesterolemia, unspecified: Secondary | ICD-10-CM | POA: Diagnosis not present

## 2015-10-15 DIAGNOSIS — N400 Enlarged prostate without lower urinary tract symptoms: Secondary | ICD-10-CM | POA: Diagnosis not present

## 2015-10-15 DIAGNOSIS — Z79899 Other long term (current) drug therapy: Secondary | ICD-10-CM | POA: Diagnosis not present

## 2015-11-02 DIAGNOSIS — I10 Essential (primary) hypertension: Secondary | ICD-10-CM | POA: Diagnosis not present

## 2015-11-02 DIAGNOSIS — M79609 Pain in unspecified limb: Secondary | ICD-10-CM | POA: Diagnosis not present

## 2015-11-02 DIAGNOSIS — L0390 Cellulitis, unspecified: Secondary | ICD-10-CM | POA: Diagnosis not present

## 2015-11-05 DIAGNOSIS — E7800 Pure hypercholesterolemia, unspecified: Secondary | ICD-10-CM | POA: Diagnosis not present

## 2015-11-05 DIAGNOSIS — I10 Essential (primary) hypertension: Secondary | ICD-10-CM | POA: Diagnosis not present

## 2015-11-05 DIAGNOSIS — L0390 Cellulitis, unspecified: Secondary | ICD-10-CM | POA: Diagnosis not present

## 2015-11-05 DIAGNOSIS — R51 Headache: Secondary | ICD-10-CM | POA: Diagnosis not present

## 2015-11-05 DIAGNOSIS — R5383 Other fatigue: Secondary | ICD-10-CM | POA: Diagnosis not present

## 2015-11-05 DIAGNOSIS — E669 Obesity, unspecified: Secondary | ICD-10-CM | POA: Diagnosis not present

## 2015-11-12 DIAGNOSIS — I10 Essential (primary) hypertension: Secondary | ICD-10-CM | POA: Diagnosis not present

## 2015-11-12 DIAGNOSIS — N400 Enlarged prostate without lower urinary tract symptoms: Secondary | ICD-10-CM | POA: Diagnosis not present

## 2015-11-12 DIAGNOSIS — E7800 Pure hypercholesterolemia, unspecified: Secondary | ICD-10-CM | POA: Diagnosis not present

## 2015-11-12 DIAGNOSIS — Z79899 Other long term (current) drug therapy: Secondary | ICD-10-CM | POA: Diagnosis not present

## 2015-12-09 DIAGNOSIS — N401 Enlarged prostate with lower urinary tract symptoms: Secondary | ICD-10-CM | POA: Diagnosis not present

## 2015-12-09 DIAGNOSIS — N411 Chronic prostatitis: Secondary | ICD-10-CM | POA: Diagnosis not present

## 2015-12-22 DIAGNOSIS — M542 Cervicalgia: Secondary | ICD-10-CM | POA: Diagnosis not present

## 2015-12-22 DIAGNOSIS — I10 Essential (primary) hypertension: Secondary | ICD-10-CM | POA: Diagnosis not present

## 2015-12-22 DIAGNOSIS — Z79899 Other long term (current) drug therapy: Secondary | ICD-10-CM | POA: Diagnosis not present

## 2015-12-22 DIAGNOSIS — M25512 Pain in left shoulder: Secondary | ICD-10-CM | POA: Diagnosis not present

## 2016-01-08 DIAGNOSIS — M19012 Primary osteoarthritis, left shoulder: Secondary | ICD-10-CM | POA: Diagnosis not present

## 2016-02-02 DIAGNOSIS — N401 Enlarged prostate with lower urinary tract symptoms: Secondary | ICD-10-CM | POA: Diagnosis not present

## 2016-02-05 DIAGNOSIS — M19012 Primary osteoarthritis, left shoulder: Secondary | ICD-10-CM | POA: Diagnosis not present

## 2016-02-14 DIAGNOSIS — M25512 Pain in left shoulder: Secondary | ICD-10-CM | POA: Diagnosis not present

## 2016-02-24 DIAGNOSIS — M19012 Primary osteoarthritis, left shoulder: Secondary | ICD-10-CM | POA: Diagnosis not present

## 2016-02-24 DIAGNOSIS — M19011 Primary osteoarthritis, right shoulder: Secondary | ICD-10-CM | POA: Diagnosis not present

## 2016-03-24 DIAGNOSIS — M5412 Radiculopathy, cervical region: Secondary | ICD-10-CM | POA: Diagnosis not present

## 2016-03-25 DIAGNOSIS — M542 Cervicalgia: Secondary | ICD-10-CM | POA: Diagnosis not present

## 2016-03-25 DIAGNOSIS — M19012 Primary osteoarthritis, left shoulder: Secondary | ICD-10-CM | POA: Diagnosis not present

## 2016-03-29 DIAGNOSIS — M5412 Radiculopathy, cervical region: Secondary | ICD-10-CM | POA: Diagnosis not present

## 2016-03-31 DIAGNOSIS — M5412 Radiculopathy, cervical region: Secondary | ICD-10-CM | POA: Diagnosis not present

## 2016-04-07 DIAGNOSIS — M5412 Radiculopathy, cervical region: Secondary | ICD-10-CM | POA: Diagnosis not present

## 2016-04-12 DIAGNOSIS — M5412 Radiculopathy, cervical region: Secondary | ICD-10-CM | POA: Diagnosis not present

## 2016-04-14 DIAGNOSIS — M5412 Radiculopathy, cervical region: Secondary | ICD-10-CM | POA: Diagnosis not present

## 2016-04-15 DIAGNOSIS — M5412 Radiculopathy, cervical region: Secondary | ICD-10-CM | POA: Diagnosis not present

## 2016-04-18 DIAGNOSIS — I10 Essential (primary) hypertension: Secondary | ICD-10-CM | POA: Diagnosis not present

## 2016-04-18 DIAGNOSIS — E7800 Pure hypercholesterolemia, unspecified: Secondary | ICD-10-CM | POA: Diagnosis not present

## 2016-04-18 DIAGNOSIS — M5412 Radiculopathy, cervical region: Secondary | ICD-10-CM | POA: Diagnosis not present

## 2016-04-18 DIAGNOSIS — Z0001 Encounter for general adult medical examination with abnormal findings: Secondary | ICD-10-CM | POA: Diagnosis not present

## 2016-04-18 DIAGNOSIS — M542 Cervicalgia: Secondary | ICD-10-CM | POA: Diagnosis not present

## 2016-04-19 DIAGNOSIS — M5412 Radiculopathy, cervical region: Secondary | ICD-10-CM | POA: Diagnosis not present

## 2016-04-26 DIAGNOSIS — M5412 Radiculopathy, cervical region: Secondary | ICD-10-CM | POA: Diagnosis not present

## 2016-05-02 DIAGNOSIS — M5412 Radiculopathy, cervical region: Secondary | ICD-10-CM | POA: Diagnosis not present

## 2016-05-03 DIAGNOSIS — E7800 Pure hypercholesterolemia, unspecified: Secondary | ICD-10-CM | POA: Diagnosis not present

## 2016-05-03 DIAGNOSIS — I10 Essential (primary) hypertension: Secondary | ICD-10-CM | POA: Diagnosis not present

## 2016-05-03 DIAGNOSIS — N400 Enlarged prostate without lower urinary tract symptoms: Secondary | ICD-10-CM | POA: Diagnosis not present

## 2016-05-04 DIAGNOSIS — M5412 Radiculopathy, cervical region: Secondary | ICD-10-CM | POA: Diagnosis not present

## 2016-05-17 DIAGNOSIS — N400 Enlarged prostate without lower urinary tract symptoms: Secondary | ICD-10-CM | POA: Diagnosis not present

## 2016-05-17 DIAGNOSIS — I10 Essential (primary) hypertension: Secondary | ICD-10-CM | POA: Diagnosis not present

## 2016-05-17 DIAGNOSIS — E7800 Pure hypercholesterolemia, unspecified: Secondary | ICD-10-CM | POA: Diagnosis not present

## 2016-05-17 DIAGNOSIS — Z79899 Other long term (current) drug therapy: Secondary | ICD-10-CM | POA: Diagnosis not present

## 2016-05-24 DIAGNOSIS — Z79899 Other long term (current) drug therapy: Secondary | ICD-10-CM | POA: Diagnosis not present

## 2016-05-24 DIAGNOSIS — E7800 Pure hypercholesterolemia, unspecified: Secondary | ICD-10-CM | POA: Diagnosis not present

## 2016-05-24 DIAGNOSIS — I10 Essential (primary) hypertension: Secondary | ICD-10-CM | POA: Diagnosis not present

## 2016-05-31 DIAGNOSIS — M79609 Pain in unspecified limb: Secondary | ICD-10-CM | POA: Diagnosis not present

## 2016-05-31 DIAGNOSIS — Z79899 Other long term (current) drug therapy: Secondary | ICD-10-CM | POA: Diagnosis not present

## 2016-05-31 DIAGNOSIS — M25559 Pain in unspecified hip: Secondary | ICD-10-CM | POA: Diagnosis not present

## 2016-05-31 DIAGNOSIS — E7800 Pure hypercholesterolemia, unspecified: Secondary | ICD-10-CM | POA: Diagnosis not present

## 2016-06-21 DIAGNOSIS — H40033 Anatomical narrow angle, bilateral: Secondary | ICD-10-CM | POA: Diagnosis not present

## 2016-06-21 DIAGNOSIS — H2513 Age-related nuclear cataract, bilateral: Secondary | ICD-10-CM | POA: Diagnosis not present

## 2016-06-28 DIAGNOSIS — I10 Essential (primary) hypertension: Secondary | ICD-10-CM | POA: Diagnosis not present

## 2016-06-28 DIAGNOSIS — Z79899 Other long term (current) drug therapy: Secondary | ICD-10-CM | POA: Diagnosis not present

## 2016-06-28 DIAGNOSIS — E7800 Pure hypercholesterolemia, unspecified: Secondary | ICD-10-CM | POA: Diagnosis not present

## 2016-07-22 ENCOUNTER — Emergency Department (HOSPITAL_COMMUNITY): Payer: Medicare Other

## 2016-07-22 ENCOUNTER — Inpatient Hospital Stay (HOSPITAL_COMMUNITY)
Admission: EM | Admit: 2016-07-22 | Discharge: 2016-07-29 | DRG: 329 | Disposition: A | Discharge status: Home / Self Care | Payer: Medicare Other | Attending: General Surgery | Admitting: General Surgery

## 2016-07-22 ENCOUNTER — Encounter (HOSPITAL_COMMUNITY): Payer: Self-pay | Admitting: Pharmacy Technician

## 2016-07-22 DIAGNOSIS — J302 Other seasonal allergic rhinitis: Secondary | ICD-10-CM | POA: Diagnosis not present

## 2016-07-22 DIAGNOSIS — Z87891 Personal history of nicotine dependence: Secondary | ICD-10-CM

## 2016-07-22 DIAGNOSIS — Z79899 Other long term (current) drug therapy: Secondary | ICD-10-CM

## 2016-07-22 DIAGNOSIS — K5900 Constipation, unspecified: Secondary | ICD-10-CM | POA: Diagnosis present

## 2016-07-22 DIAGNOSIS — R001 Bradycardia, unspecified: Secondary | ICD-10-CM | POA: Diagnosis not present

## 2016-07-22 DIAGNOSIS — N400 Enlarged prostate without lower urinary tract symptoms: Secondary | ICD-10-CM | POA: Diagnosis not present

## 2016-07-22 DIAGNOSIS — G8918 Other acute postprocedural pain: Secondary | ICD-10-CM | POA: Diagnosis not present

## 2016-07-22 DIAGNOSIS — R110 Nausea: Secondary | ICD-10-CM | POA: Diagnosis not present

## 2016-07-22 DIAGNOSIS — K429 Umbilical hernia without obstruction or gangrene: Secondary | ICD-10-CM | POA: Diagnosis not present

## 2016-07-22 DIAGNOSIS — K659 Peritonitis, unspecified: Secondary | ICD-10-CM | POA: Diagnosis not present

## 2016-07-22 DIAGNOSIS — R4182 Altered mental status, unspecified: Secondary | ICD-10-CM | POA: Diagnosis present

## 2016-07-22 DIAGNOSIS — I10 Essential (primary) hypertension: Secondary | ICD-10-CM | POA: Diagnosis not present

## 2016-07-22 DIAGNOSIS — D62 Acute posthemorrhagic anemia: Secondary | ICD-10-CM | POA: Diagnosis not present

## 2016-07-22 DIAGNOSIS — K219 Gastro-esophageal reflux disease without esophagitis: Secondary | ICD-10-CM | POA: Diagnosis not present

## 2016-07-22 DIAGNOSIS — N179 Acute kidney failure, unspecified: Secondary | ICD-10-CM | POA: Diagnosis not present

## 2016-07-22 DIAGNOSIS — M1990 Unspecified osteoarthritis, unspecified site: Secondary | ICD-10-CM | POA: Diagnosis not present

## 2016-07-22 DIAGNOSIS — K567 Ileus, unspecified: Secondary | ICD-10-CM | POA: Diagnosis not present

## 2016-07-22 DIAGNOSIS — K668 Other specified disorders of peritoneum: Secondary | ICD-10-CM | POA: Diagnosis present

## 2016-07-22 DIAGNOSIS — R079 Chest pain, unspecified: Secondary | ICD-10-CM | POA: Diagnosis not present

## 2016-07-22 DIAGNOSIS — R109 Unspecified abdominal pain: Secondary | ICD-10-CM | POA: Diagnosis not present

## 2016-07-22 DIAGNOSIS — K259 Gastric ulcer, unspecified as acute or chronic, without hemorrhage or perforation: Secondary | ICD-10-CM

## 2016-07-22 DIAGNOSIS — K9189 Other postprocedural complications and disorders of digestive system: Secondary | ICD-10-CM

## 2016-07-22 DIAGNOSIS — Z96652 Presence of left artificial knee joint: Secondary | ICD-10-CM | POA: Diagnosis not present

## 2016-07-22 DIAGNOSIS — K251 Acute gastric ulcer with perforation: Secondary | ICD-10-CM | POA: Diagnosis not present

## 2016-07-22 DIAGNOSIS — Z96643 Presence of artificial hip joint, bilateral: Secondary | ICD-10-CM | POA: Diagnosis present

## 2016-07-22 DIAGNOSIS — K275 Chronic or unspecified peptic ulcer, site unspecified, with perforation: Secondary | ICD-10-CM | POA: Diagnosis present

## 2016-07-22 DIAGNOSIS — K2970 Gastritis, unspecified, without bleeding: Secondary | ICD-10-CM | POA: Diagnosis not present

## 2016-07-22 DIAGNOSIS — Z9889 Other specified postprocedural states: Secondary | ICD-10-CM

## 2016-07-22 LAB — I-STAT TROPONIN, ED: TROPONIN I, POC: 0 ng/mL (ref 0.00–0.08)

## 2016-07-22 MED ORDER — SODIUM CHLORIDE 0.9 % IV BOLUS (SEPSIS)
1000.0000 mL | Freq: Once | INTRAVENOUS | Status: AC
  Administered 2016-07-23: 1000 mL via INTRAVENOUS

## 2016-07-22 MED ORDER — ONDANSETRON HCL 4 MG/2ML IJ SOLN
4.0000 mg | Freq: Once | INTRAMUSCULAR | Status: AC
  Administered 2016-07-22: 4 mg via INTRAVENOUS
  Filled 2016-07-22: qty 2

## 2016-07-22 NOTE — ED Triage Notes (Signed)
Pt arrives to the ed via EMS from home with complaints of ABD pain X2 weeks worse today. Pt has been taking gasX and laxatives per ems. Pt also C/O N/V. Per ems pt became diaphoretic and bradycardic in the low 30's as well as unresponsive for approx 30 seconds en route. Pt is pale upon arrival. Pt given 200CC NS en route with EMS.

## 2016-07-22 NOTE — ED Provider Notes (Signed)
[facility]-EMERGENCY DEPT Provider Note   CSN: [id number] Arrival date & time: 07/22/16  2251  By signing my name below, I, Elder Negus, attest that this documentation has been prepared under the direction and in the presence of Zadie Rhine, MD. Electronically Signed: Elder Negus, Scribe. 07/22/16. 11:38 PM.   History   Chief Complaint Chief Complaint  Patient presents with  . Abdominal Pain  . Loss of Consciousness    HPI Raymond Graham is a 66 y.o. male with history of HTN and GERD who presents to the ED for evaluation of abdominal pain. This patient states that in the last 3 weeks he has experienced abdominal bloating with diffuse pain. He has attempted laxatives and other OTC therapies at home without improvement. Tonight his symptoms were intolerable. He vomited once with "dark stuff" in the emesis. His wife contacted EMS. During their transport he apparently was unresponsive for less than 1 minute and bradycardic in the 30s bpm; at baseline mental status in triage. At interview, he is also reporting myalgias. Intermittent constipation in the last 3 weeks as well. He is followed by GI; had an EGD in 2016. Known umbilical hernia.   The history is provided by the patient. No language interpreter was used.  Abdominal Pain   This is a new problem. Episode onset: 3 weeks. The problem occurs constantly. The problem has been gradually worsening. Associated with: bloating. The pain is located in the generalized abdominal region. The pain is severe. Associated symptoms include constipation (intermittent) and myalgias. Pertinent negatives include melena.    Past Medical History:  Diagnosis Date  . 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 BPH (benign prostatic hypertrophy)    PT ON FLOMAX-DR. WRENN IS PT'S UROLOGIST  . GERD (gastroesophageal reflux disease)    OCAS-NO MEDS  . Hypertension   . Seasonal allergies     Patient Active Problem List   Diagnosis  Date Noted  . 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 BACK SURGERY  10/2008   LUMBAR SURGERY FOR STENOSIS  . JOINT REPLACEMENT  2005   RIGHT HIP REPLACEMENT   . LEFT BUNIONECTOMY    . LEFT GANGLION CYST REMOVED    . TOTAL HIP ARTHROPLASTY  05/31/2011   Procedure: TOTAL HIP ARTHROPLASTY ANTERIOR APPROACH;  Surgeon: Shelda Pal, MD;  Location: [facility];  Service: Orthopedics;  Laterality: Left;  . TOTAL KNEE ARTHROPLASTY  11/21/2011   Procedure: TOTAL KNEE ARTHROPLASTY;  Surgeon: Shelda Pal, MD;  Location: [facility];  Service: Orthopedics;  Laterality: Left;       Home Medications    Prior to Admission medications   Medication Sig Start Date End Date Taking? Authorizing Provider  Cholecalciferol (VITAMIN D) 2000 UNITS CAPS Take 4,000 Units by mouth daily.    Historical Provider, MD  cyclobenzaprine (FLEXERIL) 10 MG tablet Take 1 tablet (10 mg total) by mouth 3 (three) times daily as needed for muscle spasms. 06/24/14   Carmelina Dane, MD  ferrous sulfate 325 (65 FE) MG tablet Take 1 tablet (325 mg total) by mouth 3 (three) times daily after meals. 11/23/11 11/22/12  Lanney Gins, PA-C  fish oil-omega-3 fatty acids 1000 MG capsule Take 2 g by mouth daily.    Historical Provider, MD  losartan-hydrochlorothiazide (HYZAAR) 50-12.5 MG per tablet Take 1 tablet by mouth every morning.    Historical Provider,  MD  Lycopene 10 MG CAPS Take 1 capsule by mouth daily.    Historical Provider, MD  metoprolol succinate (TOPROL-XL) 100 MG 24 hr tablet Take 100 mg by mouth daily after breakfast. Take with or immediately following a meal.    Historical Provider, MD  mometasone (NASONEX) 50 MCG/ACT nasal spray Place 2 sprays into the nose daily as needed. For congestion    Historical Provider, MD  Multiple Vitamin (MULTIVITAMIN WITH MINERALS) TABS Take 1 tablet by mouth daily.    Historical Provider, MD  PRESCRIPTION MEDICATION  Inject 0.08-0.5 mg into the skin 3 (three) times a week. Monday, wednesday, and Friday. Pt increase as he uses the bottle ALLERY SHOTS    Historical Provider, MD  Tamsulosin HCl (FLOMAX) 0.4 MG CAPS Take 0.4 mg by mouth daily after breakfast.    Historical Provider, MD  traMADol (ULTRAM) 50 MG tablet Take 1 tablet (50 mg total) by mouth every 8 (eight) hours as needed. 06/24/14   Carmelina Dane, MD    Family History No family history on file.  Social History Social History  Substance Use Topics  . Smoking status: Former Smoker    Quit date: 04/12/1991  . Smokeless tobacco: Never Used     Comment: QUIT SMOKING ABOUT 15 OR 20 YRS AGO-ONLY SMOKED ON WEEKENDS  . Alcohol use 1.2 oz/week    2 Shots of liquor per week     Allergies   Patient has no known allergies.   Review of Systems Review of Systems  Respiratory: Negative for shortness of breath.   Gastrointestinal: Positive for constipation (intermittent). Negative for melena.  Musculoskeletal: Positive for myalgias.  All other systems reviewed and are negative.    Physical Exam Updated Vital Signs BP (!) 89/55   Pulse (!) 58   Temp 97.6 F (36.4 C) (Oral)   Resp 12   Ht 5\' 8"  (1.727 m)   Wt 200 lb (90.7 kg)   SpO2 94%   BMI 30.41 kg/m   Physical Exam CONSTITUTIONAL: Elderly and ill appearing HEAD: Normocephalic/atraumatic EYES: EOMI/PERRL ENMT: Mucous membranes dry NECK: supple no meningeal signs SPINE/BACK:entire spine nontender CV: S1/S2 noted, no murmurs/rubs/gallops noted LUNGS: Lungs are clear to auscultation bilaterally, no apparent distress ABDOMEN: Diffuse moderate tenderness with guarding; decreased bowel sounds; reducible umbilical hernia; RECTAL: No gross blood or melena noted; chaperone present.   GU:no cva tenderness; no inguinal hernia noted.  NEURO: Pt is awake/alert/appropriate, moves all extremitiesx4.  No facial droop.   EXTREMITIES: pulses normal/equal, full ROM SKIN: warm, color  normal PSYCH: no abnormalities of mood noted, alert and oriented to situation  ED Treatments / Results  Labs (all labs ordered are listed, but only abnormal results are displayed) Labs Reviewed  LIPASE, BLOOD - Abnormal; Notable for the following:       Result Value   Lipase 67 (*)    All other components within normal limits  COMPREHENSIVE METABOLIC PANEL - Abnormal; Notable for the following:    Chloride 100 (*)    Glucose, Bld 141 (*)    Creatinine, Ser 1.71 (*)    GFR calc non Af Amer 40 (*)    GFR calc Af Amer 46 (*)    All other components within normal limits  CBC  URINALYSIS, ROUTINE W REFLEX MICROSCOPIC  I-STAT TROPOININ, ED  POC OCCULT BLOOD, ED  I-STAT CG4 LACTIC ACID, ED  TYPE AND SCREEN  ABO/RH    EKG  EKG Interpretation  Date/Time:  Friday July 22 2016  22:56:39 EDT Ventricular Rate:  58 PR Interval:    QRS Duration: 88 QT Interval:  408 QTC Calculation: 401 R Axis:   2 Text Interpretation:  Sinus rhythm No significant change since last tracing Confirmed by Bebe Shaggy  MD, Aaliyah Gavel ([id number]) on 07/22/2016 11:47:50 PM       Radiology Ct Abdomen Pelvis W Contrast  Result Date: 07/23/2016 CLINICAL DATA:  Diffuse abdominal pain, pneumoperitoneum EXAM: CT ABDOMEN AND PELVIS WITH CONTRAST TECHNIQUE: Multidetector CT imaging of the abdomen and pelvis was performed using the standard protocol following bolus administration of intravenous contrast. CONTRAST:  80mL ISOVUE-300 IOPAMIDOL (ISOVUE-300) INJECTION 61% COMPARISON:  None. FINDINGS: There is pneumoperitoneum noted largely in the upper abdomen with small amount free fluid overlying the liver and spleen. Suspect an upper abdominal source for the perforation possibly a perforated gastric or duodenal ulcer given the mesenteric induration and edema seen more so in the right upper quadrant. Lower chest: Normal size cardiac chambers. Trace right pleural effusion. Bibasilar atelectasis. Hepatobiliary: No space-occupying mass  of the liver. There is a small amount of free fluid draped over the liver Pancreas: Unremarkable. No pancreatic ductal dilatation or surrounding inflammatory changes. Spleen: Normal in size without focal abnormality. Adrenals/Urinary Tract: Adrenal glands are unremarkable. Kidneys are normal, without renal calculi, focal lesion, or hydronephrosis. Bladder is unremarkable. Stomach/Bowel: The stomach is contrast distended. No extravasation of ingested enteric contrast is seen. Contrast passes into the duodenum. There is no small bowel dilatation, fold nor mural thickening. A few scattered colonic diverticular are noted along the transverse colon however they are more conspicuous along the distal descending and sigmoid colon. No significant collections of air are identified along the left colon to suggest that this is the etiology for the free air. Normal appearing appendix. Vascular/Lymphatic: Aortic atherosclerosis. No enlarged abdominal or pelvic lymph nodes. Reproductive: Prostate is unremarkable. Other: A fat containing umbilical hernia. Some free air seen within the umbilical hernia as well. Musculoskeletal: Bilateral hip arthroplasties are noted. Degenerative disc disease at L5-S1. No acute nor suspicious osseous abnormality. IMPRESSION: 1. Pneumoperitoneum is confirmed likely from an upper abdominal source possibly from the right upper quadrant given the soft tissue induration seen as well as small amount of free fluid outlining the liver and to a lesser degree the spleen. No extravasation of the ingested enteric contrast is noted and findings may be due to a sealed perforation. Critical Value/emergent results were called by telephone at the time of interpretation on 07/23/2016 at 3:15 am to Dr. Zadie Rhine , who verbally acknowledged these results. 2. Distal descending and sigmoid diverticulosis without acute diverticulitis. Given the lack of free air surrounding a segment of bowel, it is less likely that a  perforated diverticulum from this location is the etiology. Electronically Signed   By: Tollie Eth M.D.   On: 07/23/2016 03:25   Dg Chest Port 1 View  Result Date: 07/23/2016 CLINICAL DATA:  Pain x1 day EXAM: PORTABLE CHEST 1 VIEW COMPARISON:  05/25/2011 CXR FINDINGS: AP view of the chest was acquired portably. Mild cardiac enlargement may be due to the AP technique. Tortuous atherosclerotic aorta without aneurysm. Mild central vascular congestion without pneumonic consolidation, effusion or pneumothorax. Minimal atelectasis and/or scarring at the left base. No acute nor suspicious osseous abnormality. Osteoarthritis of both glenohumeral joints with joint space narrowing and sclerosis and spurring off the inferomedial aspect of both humeral heads. Rotator cuff calcific tendinopathy is seen adjacent to the left humeral head. IMPRESSION: Mild central vascular congestion with borderline  cardiomegaly. Aortic atherosclerosis. Osteoarthritis of both glenohumeral joints. Electronically Signed   By: Tollie Eth M.D.   On: 07/23/2016 00:58   Dg Abd Portable 2 Views  Result Date: 07/23/2016 CLINICAL DATA:  Abdominal pain for 1 day EXAM: PORTABLE ABDOMEN - 2 VIEW COMPARISON:  None. FINDINGS: Crescentic subdiaphragmatic appearing air like lucency is seen on the upright view of the abdomen. Findings are concerning for possible pneumoperitoneum. Bowel gas pattern is unremarkable. No organomegaly is seen. There is lower lumbar degenerative disc and facet arthropathy. Bilateral total hip arthroplasties are also partially imaged. No radiopaque calculi. IMPRESSION: Crescentic air like lucency on the upright projection beneath right hemidiaphragm is noted. Pneumoperitoneum is not excluded. CT of the abdomen and pelvis is therefore recommended for further correlation. Critical Value/emergent results were called by telephone at the time of interpretation on 07/23/2016 at 1:01 am to Dr. Zadie Rhine , who verbally acknowledged  these results. Electronically Signed   By: Tollie Eth M.D.   On: 07/23/2016 01:02    Procedures Procedures  CRITICAL CARE Performed by: Joya Gaskins Total critical care time: 50 minutes Critical care time was exclusive of separately billable procedures and treating other patients. Critical care was necessary to treat or prevent imminent or life-threatening deterioration. Critical care was time spent personally by me on the following activities: development of treatment plan with patient and/or surrogate as well as nursing, discussions with consultants, evaluation of patient's response to treatment, examination of patient, obtaining history from patient or surrogate, ordering and performing treatments and interventions, ordering and review of laboratory studies, ordering and review of radiographic studies, pulse oximetry and re-evaluation of patient's condition. PATIENT WITH PNEUMOPERITONEUM, REQUIRING EMERGENT SURGICAL REPAIR  Medications Ordered in ED Medications  iopamidol (ISOVUE-300) 61 % injection (not administered)  ondansetron (ZOFRAN) injection 4 mg (4 mg Intravenous Given 07/22/16 2358)  sodium chloride 0.9 % bolus 1,000 mL (0 mLs Intravenous Stopped 07/23/16 0048)  fentaNYL (SUBLIMAZE) injection 100 mcg (100 mcg Intravenous Given 07/23/16 0047)  sodium chloride 0.9 % bolus 1,000 mL (0 mLs Intravenous Stopped 07/23/16 0234)  fentaNYL (SUBLIMAZE) injection 50 mcg (50 mcg Intravenous Given 07/23/16 0126)  piperacillin-tazobactam (ZOSYN) IVPB 3.375 g (3.375 g Intravenous New Bag/Given 07/23/16 0242)  iopamidol (ISOVUE-300) 61 % injection (80 mLs  Contrast Given 07/23/16 0251)  ondansetron (ZOFRAN) injection 4 mg (4 mg Intravenous Given 07/23/16 0231)  fentaNYL (SUBLIMAZE) injection 50 mcg (50 mcg Intravenous Given 07/23/16 0242)  ondansetron (ZOFRAN) injection 4 mg (4 mg Intravenous Given 07/23/16 0349)  fentaNYL (SUBLIMAZE) injection 50 mcg (50 mcg Intravenous Given 07/23/16 0349)      Initial Impression / Assessment and Plan / ED Course  I have reviewed the triage vital signs and the nursing notes.  Pertinent labs & imaging results that were available during my care of the patient were reviewed by me and considered in my medical decision making (see chart for details).     12:48 AM Pt in the ED for abd pain/constipation He is ill appearing Apparently en route he had brief unresponsive episode with HR in 30s Currently when I am in room, HR 50s Will follow closely 1:17 AM I SPOKE TO RADIOLOGIST CONCERNING XRAY FINDINGS CONCERN FOR PNEUMOPERITONEUM DR Sterling Big IS REQUESTING CT IMAGING FOR FURTHER EVALUATION PT CURRENTLY AWAKE/ALERT BP 120/76   Pulse 65   Temp 97.6 F (36.4 C) (Oral)   Resp 20   Ht  (1.727 m)   Wt 90.7 kg   SpO2 94%   BMI 30.41  kg/m  I HAVE CONSULTED GENERAL SURGERY AND WILL PROCEED WITH CT IMAGING WHILE AWAITING SURGICAL CONSULTATION 1:24 AM D/w dr Rowan Blase We discussed case Will start IV antibiotics Due to his stability, she does request CT abd/pelvis with IV/PO contrast Will continue to monitor 2:47 AM Vitals remains appropriate but pt reports worsening pain and he has vomited contrast He is now off to CT imaging 3:41 AM CT confirms pneumoperitoneum I spoke to radiology Dr Sterling Big A page to general surgery has been placed 3:57 AM D/W DR BYERLEY WITH SURGERY SHE WILL SEE PATIENT PATIENT/FAMILY UPDATED ON PLAN  Final Clinical Impressions(s) / ED Diagnoses   Final diagnoses:  Pneumoperitoneum    New Prescriptions New Prescriptions   No medications on file  I personally performed the services described in this documentation, which was scribed in my presence. The recorded information has been reviewed and is accurate.       Zadie Rhine, MD 07/23/16 [phone]

## 2016-07-23 ENCOUNTER — Encounter (HOSPITAL_COMMUNITY): Admission: EM | Disposition: A | Discharge status: Home / Self Care | Payer: Self-pay | Source: Home / Self Care

## 2016-07-23 ENCOUNTER — Emergency Department (HOSPITAL_COMMUNITY): Payer: Medicare Other | Admitting: Certified Registered"

## 2016-07-23 ENCOUNTER — Emergency Department (HOSPITAL_COMMUNITY): Payer: Medicare Other

## 2016-07-23 ENCOUNTER — Encounter (HOSPITAL_COMMUNITY): Payer: Self-pay

## 2016-07-23 DIAGNOSIS — Z79899 Other long term (current) drug therapy: Secondary | ICD-10-CM | POA: Diagnosis not present

## 2016-07-23 DIAGNOSIS — K449 Diaphragmatic hernia without obstruction or gangrene: Secondary | ICD-10-CM | POA: Diagnosis not present

## 2016-07-23 DIAGNOSIS — K7689 Other specified diseases of liver: Secondary | ICD-10-CM | POA: Diagnosis not present

## 2016-07-23 DIAGNOSIS — Z87891 Personal history of nicotine dependence: Secondary | ICD-10-CM | POA: Diagnosis not present

## 2016-07-23 DIAGNOSIS — R079 Chest pain, unspecified: Secondary | ICD-10-CM | POA: Diagnosis not present

## 2016-07-23 DIAGNOSIS — K668 Other specified disorders of peritoneum: Secondary | ICD-10-CM | POA: Diagnosis present

## 2016-07-23 DIAGNOSIS — G8918 Other acute postprocedural pain: Secondary | ICD-10-CM | POA: Diagnosis present

## 2016-07-23 DIAGNOSIS — M1990 Unspecified osteoarthritis, unspecified site: Secondary | ICD-10-CM | POA: Diagnosis present

## 2016-07-23 DIAGNOSIS — J302 Other seasonal allergic rhinitis: Secondary | ICD-10-CM | POA: Diagnosis present

## 2016-07-23 DIAGNOSIS — N179 Acute kidney failure, unspecified: Secondary | ICD-10-CM | POA: Diagnosis not present

## 2016-07-23 DIAGNOSIS — I10 Essential (primary) hypertension: Secondary | ICD-10-CM | POA: Diagnosis present

## 2016-07-23 DIAGNOSIS — K5900 Constipation, unspecified: Secondary | ICD-10-CM | POA: Diagnosis present

## 2016-07-23 DIAGNOSIS — K251 Acute gastric ulcer with perforation: Secondary | ICD-10-CM | POA: Diagnosis not present

## 2016-07-23 DIAGNOSIS — D62 Acute posthemorrhagic anemia: Secondary | ICD-10-CM | POA: Diagnosis not present

## 2016-07-23 DIAGNOSIS — K275 Chronic or unspecified peptic ulcer, site unspecified, with perforation: Secondary | ICD-10-CM | POA: Diagnosis not present

## 2016-07-23 DIAGNOSIS — K429 Umbilical hernia without obstruction or gangrene: Secondary | ICD-10-CM | POA: Diagnosis present

## 2016-07-23 DIAGNOSIS — K567 Ileus, unspecified: Secondary | ICD-10-CM | POA: Diagnosis not present

## 2016-07-23 DIAGNOSIS — K219 Gastro-esophageal reflux disease without esophagitis: Secondary | ICD-10-CM | POA: Diagnosis not present

## 2016-07-23 DIAGNOSIS — Z96643 Presence of artificial hip joint, bilateral: Secondary | ICD-10-CM | POA: Diagnosis present

## 2016-07-23 DIAGNOSIS — K659 Peritonitis, unspecified: Secondary | ICD-10-CM | POA: Diagnosis not present

## 2016-07-23 DIAGNOSIS — R109 Unspecified abdominal pain: Secondary | ICD-10-CM | POA: Diagnosis not present

## 2016-07-23 DIAGNOSIS — Z96652 Presence of left artificial knee joint: Secondary | ICD-10-CM | POA: Diagnosis present

## 2016-07-23 DIAGNOSIS — N400 Enlarged prostate without lower urinary tract symptoms: Secondary | ICD-10-CM | POA: Diagnosis present

## 2016-07-23 DIAGNOSIS — R001 Bradycardia, unspecified: Secondary | ICD-10-CM | POA: Diagnosis present

## 2016-07-23 DIAGNOSIS — K255 Chronic or unspecified gastric ulcer with perforation: Secondary | ICD-10-CM | POA: Diagnosis not present

## 2016-07-23 DIAGNOSIS — R4182 Altered mental status, unspecified: Secondary | ICD-10-CM | POA: Diagnosis present

## 2016-07-23 HISTORY — PX: LAPAROTOMY: SHX154

## 2016-07-23 LAB — COMPREHENSIVE METABOLIC PANEL
ALT: 21 U/L (ref 17–63)
AST: 25 U/L (ref 15–41)
Albumin: 3.7 g/dL (ref 3.5–5.0)
Alkaline Phosphatase: 67 U/L (ref 38–126)
Anion gap: 9 (ref 5–15)
BUN: 15 mg/dL (ref 6–20)
CHLORIDE: 100 mmol/L — AB (ref 101–111)
CO2: 28 mmol/L (ref 22–32)
Calcium: 9.3 mg/dL (ref 8.9–10.3)
Creatinine, Ser: 1.71 mg/dL — ABNORMAL HIGH (ref 0.61–1.24)
GFR calc Af Amer: 46 mL/min — ABNORMAL LOW (ref >60)
GFR, EST NON AFRICAN AMERICAN: 40 mL/min — AB (ref >60)
Glucose, Bld: 141 mg/dL — ABNORMAL HIGH (ref 65–99)
POTASSIUM: 4 mmol/L (ref 3.5–5.1)
SODIUM: 137 mmol/L (ref 135–145)
Total Bilirubin: 0.8 mg/dL (ref 0.3–1.2)
Total Protein: 6.9 g/dL (ref 6.5–8.1)

## 2016-07-23 LAB — MRSA PCR SCREENING: MRSA by PCR: NEGATIVE

## 2016-07-23 LAB — CBC
HEMATOCRIT: 39.1 % (ref 39.0–52.0)
Hemoglobin: 13.1 g/dL (ref 13.0–17.0)
MCH: 30.5 pg (ref 26.0–34.0)
MCHC: 33.5 g/dL (ref 30.0–36.0)
MCV: 91.1 fL (ref 78.0–100.0)
Platelets: 206 10*3/uL (ref 150–400)
RBC: 4.29 MIL/uL (ref 4.22–5.81)
RDW: 13.9 % (ref 11.5–15.5)
WBC: 10.4 10*3/uL (ref 4.0–10.5)

## 2016-07-23 LAB — ABO/RH: ABO/RH(D): AB POS

## 2016-07-23 LAB — TYPE AND SCREEN
ABO/RH(D): AB POS
ANTIBODY SCREEN: NEGATIVE

## 2016-07-23 LAB — I-STAT CG4 LACTIC ACID, ED: LACTIC ACID, VENOUS: 1.19 mmol/L (ref 0.5–1.9)

## 2016-07-23 LAB — POC OCCULT BLOOD, ED: Fecal Occult Bld: NEGATIVE

## 2016-07-23 LAB — LIPASE, BLOOD: LIPASE: 67 U/L — AB (ref 11–51)

## 2016-07-23 SURGERY — LAPAROTOMY, EXPLORATORY
Anesthesia: General | Site: Abdomen

## 2016-07-23 MED ORDER — FENTANYL CITRATE (PF) 100 MCG/2ML IJ SOLN
50.0000 ug | Freq: Once | INTRAMUSCULAR | Status: AC
  Administered 2016-07-23: 50 ug via INTRAVENOUS
  Filled 2016-07-23: qty 2

## 2016-07-23 MED ORDER — VANCOMYCIN HCL IN DEXTROSE 1-5 GM/200ML-% IV SOLN: 1000.0000 mg | INTRAVENOUS | Status: AC

## 2016-07-23 MED ORDER — SUGAMMADEX SODIUM 200 MG/2ML IV SOLN
INTRAVENOUS | Status: AC
  Filled 2016-07-23: qty 2

## 2016-07-23 MED ORDER — PHENYLEPHRINE 40 MCG/ML (10ML) SYRINGE FOR IV PUSH (FOR BLOOD PRESSURE SUPPORT)
PREFILLED_SYRINGE | INTRAVENOUS | Status: AC
  Filled 2016-07-23: qty 30

## 2016-07-23 MED ORDER — FENTANYL CITRATE (PF) 250 MCG/5ML IJ SOLN
INTRAMUSCULAR | Status: AC
  Filled 2016-07-23: qty 5

## 2016-07-23 MED ORDER — PANTOPRAZOLE SODIUM 40 MG IV SOLR
40.0000 mg | Freq: Every day | INTRAVENOUS | Status: DC
  Administered 2016-07-23 – 2016-07-26 (×4): 40 mg via INTRAVENOUS
  Filled 2016-07-23 (×4): qty 40

## 2016-07-23 MED ORDER — FLUTICASONE PROPIONATE 50 MCG/ACT NA SUSP
1.0000 | Freq: Every day | NASAL | Status: DC
  Administered 2016-07-25 – 2016-07-29 (×3): 1 via NASAL
  Filled 2016-07-23 (×2): qty 16

## 2016-07-23 MED ORDER — ONDANSETRON HCL 4 MG/2ML IJ SOLN
4.0000 mg | Freq: Once | INTRAMUSCULAR | Status: AC
  Administered 2016-07-23: 4 mg via INTRAVENOUS
  Filled 2016-07-23: qty 2

## 2016-07-23 MED ORDER — MIDAZOLAM HCL 2 MG/2ML IJ SOLN
INTRAMUSCULAR | Status: AC
  Filled 2016-07-23: qty 2

## 2016-07-23 MED ORDER — PROPOFOL 10 MG/ML IV BOLUS
INTRAVENOUS | Status: DC | PRN
  Administered 2016-07-23: 20 mg via INTRAVENOUS
  Administered 2016-07-23: 130 mg via INTRAVENOUS

## 2016-07-23 MED ORDER — DIPHENHYDRAMINE HCL 12.5 MG/5ML PO ELIX: 12.5000 mg | ORAL_SOLUTION | Freq: Four times a day (QID) | ORAL | Status: DC | PRN

## 2016-07-23 MED ORDER — HYDRALAZINE HCL 20 MG/ML IJ SOLN
10.0000 mg | INTRAMUSCULAR | Status: DC | PRN
  Administered 2016-07-26: 10 mg via INTRAVENOUS
  Filled 2016-07-23: qty 1

## 2016-07-23 MED ORDER — PIPERACILLIN-TAZOBACTAM 3.375 G IVPB
3.3750 g | Freq: Three times a day (TID) | INTRAVENOUS | Status: DC
  Administered 2016-07-23 – 2016-07-29 (×18): 3.375 g via INTRAVENOUS
  Filled 2016-07-23 (×20): qty 50

## 2016-07-23 MED ORDER — ONDANSETRON HCL 4 MG/2ML IJ SOLN: 4.0000 mg | Freq: Four times a day (QID) | INTRAMUSCULAR | Status: DC | PRN

## 2016-07-23 MED ORDER — MORPHINE SULFATE 2 MG/ML IV SOLN
INTRAVENOUS | Status: DC
  Administered 2016-07-23: 5 mg via INTRAVENOUS
  Administered 2016-07-23 (×2): via INTRAVENOUS
  Administered 2016-07-24: 4 mg via INTRAVENOUS
  Administered 2016-07-24: 2 mg via INTRAVENOUS
  Filled 2016-07-23 (×2): qty 30

## 2016-07-23 MED ORDER — EPHEDRINE 5 MG/ML INJ
INTRAVENOUS | Status: AC
  Filled 2016-07-23: qty 20

## 2016-07-23 MED ORDER — DIPHENHYDRAMINE HCL 50 MG/ML IJ SOLN: 12.5000 mg | Freq: Four times a day (QID) | INTRAMUSCULAR | Status: DC | PRN

## 2016-07-23 MED ORDER — FENTANYL CITRATE (PF) 100 MCG/2ML IJ SOLN
100.0000 ug | Freq: Once | INTRAMUSCULAR | Status: AC
  Administered 2016-07-23: 100 ug via INTRAVENOUS
  Filled 2016-07-23: qty 2

## 2016-07-23 MED ORDER — HYDROMORPHONE HCL 1 MG/ML IJ SOLN
INTRAMUSCULAR | Status: AC
  Filled 2016-07-23: qty 0.5

## 2016-07-23 MED ORDER — HYDROMORPHONE HCL 1 MG/ML IJ SOLN
0.2500 mg | INTRAMUSCULAR | Status: DC | PRN
  Administered 2016-07-23: 0.5 mg via INTRAVENOUS

## 2016-07-23 MED ORDER — PROPOFOL 10 MG/ML IV BOLUS
INTRAVENOUS | Status: AC
  Filled 2016-07-23: qty 20

## 2016-07-23 MED ORDER — SUCCINYLCHOLINE CHLORIDE 200 MG/10ML IV SOSY
PREFILLED_SYRINGE | INTRAVENOUS | Status: AC
  Filled 2016-07-23: qty 10

## 2016-07-23 MED ORDER — MORPHINE SULFATE 2 MG/ML IV SOLN
INTRAVENOUS | Status: AC
  Filled 2016-07-23: qty 30

## 2016-07-23 MED ORDER — GLYCOPYRROLATE 0.2 MG/ML IJ SOLN
INTRAMUSCULAR | Status: DC | PRN
  Administered 2016-07-23: 0.2 mg via INTRAVENOUS

## 2016-07-23 MED ORDER — LACTATED RINGERS IV SOLN
INTRAVENOUS | Status: DC | PRN
  Administered 2016-07-23 (×2): via INTRAVENOUS

## 2016-07-23 MED ORDER — THIAMINE HCL 100 MG/ML IJ SOLN
100.0000 mg | Freq: Every day | INTRAMUSCULAR | Status: DC
  Administered 2016-07-23 – 2016-07-28 (×5): 100 mg via INTRAVENOUS
  Filled 2016-07-23 (×5): qty 2

## 2016-07-23 MED ORDER — ARTIFICIAL TEARS OP OINT
TOPICAL_OINTMENT | OPHTHALMIC | Status: AC
  Filled 2016-07-23: qty 7

## 2016-07-23 MED ORDER — LIDOCAINE 2% (20 MG/ML) 5 ML SYRINGE
INTRAMUSCULAR | Status: AC
  Filled 2016-07-23: qty 25

## 2016-07-23 MED ORDER — KETOROLAC TROMETHAMINE 30 MG/ML IJ SOLN
INTRAMUSCULAR | Status: AC
  Filled 2016-07-23: qty 1

## 2016-07-23 MED ORDER — SUGAMMADEX SODIUM 500 MG/5ML IV SOLN
INTRAVENOUS | Status: AC
  Filled 2016-07-23: qty 10

## 2016-07-23 MED ORDER — FOLIC ACID 5 MG/ML IJ SOLN
1.0000 mg | Freq: Every day | INTRAMUSCULAR | Status: DC
  Administered 2016-07-23 – 2016-07-26 (×3): 1 mg via INTRAVENOUS
  Filled 2016-07-23 (×6): qty 0.2

## 2016-07-23 MED ORDER — ONDANSETRON HCL 4 MG/2ML IJ SOLN
INTRAMUSCULAR | Status: DC | PRN
  Administered 2016-07-23: 4 mg via INTRAVENOUS

## 2016-07-23 MED ORDER — LIDOCAINE HCL (CARDIAC) 20 MG/ML IV SOLN
INTRAVENOUS | Status: DC | PRN
  Administered 2016-07-23: 100 mg via INTRATRACHEAL

## 2016-07-23 MED ORDER — ROCURONIUM BROMIDE 100 MG/10ML IV SOLN
INTRAVENOUS | Status: DC | PRN
  Administered 2016-07-23: 50 mg via INTRAVENOUS

## 2016-07-23 MED ORDER — DEXAMETHASONE SODIUM PHOSPHATE 10 MG/ML IJ SOLN
INTRAMUSCULAR | Status: AC
  Filled 2016-07-23: qty 1

## 2016-07-23 MED ORDER — 0.9 % SODIUM CHLORIDE (POUR BTL) OPTIME
TOPICAL | Status: DC | PRN
  Administered 2016-07-23: 4000 mL

## 2016-07-23 MED ORDER — MEPERIDINE HCL 25 MG/ML IJ SOLN: 6.2500 mg | INTRAMUSCULAR | Status: DC | PRN

## 2016-07-23 MED ORDER — IOPAMIDOL (ISOVUE-300) INJECTION 61%
INTRAVENOUS | Status: AC
  Filled 2016-07-23: qty 100

## 2016-07-23 MED ORDER — ONDANSETRON HCL 4 MG/2ML IJ SOLN
INTRAMUSCULAR | Status: AC
  Filled 2016-07-23: qty 2

## 2016-07-23 MED ORDER — DEXTROSE 5 % IV SOLN
500.0000 mg | Freq: Four times a day (QID) | INTRAVENOUS | Status: DC | PRN
  Filled 2016-07-23: qty 5

## 2016-07-23 MED ORDER — PIPERACILLIN-TAZOBACTAM 3.375 G IVPB
3.3750 g | Freq: Three times a day (TID) | INTRAVENOUS | Status: DC
  Filled 2016-07-23 (×2): qty 50

## 2016-07-23 MED ORDER — DEXAMETHASONE SODIUM PHOSPHATE 10 MG/ML IJ SOLN
INTRAMUSCULAR | Status: DC | PRN
  Administered 2016-07-23: 10 mg via INTRAVENOUS

## 2016-07-23 MED ORDER — MORPHINE SULFATE (PF) 2 MG/ML IV SOLN: 1.0000 mg | INTRAVENOUS | Status: DC | PRN

## 2016-07-23 MED ORDER — SUFENTANIL CITRATE 50 MCG/ML IV SOLN
INTRAVENOUS | Status: DC | PRN
  Administered 2016-07-23: 30 ug via INTRAVENOUS
  Administered 2016-07-23: 20 ug via INTRAVENOUS

## 2016-07-23 MED ORDER — DEXAMETHASONE SODIUM PHOSPHATE 10 MG/ML IJ SOLN
INTRAMUSCULAR | Status: AC
  Filled 2016-07-23: qty 4

## 2016-07-23 MED ORDER — DEXTROSE IN LACTATED RINGERS 5 % IV SOLN
INTRAVENOUS | Status: DC
  Administered 2016-07-23: 125 mL/h via INTRAVENOUS
  Administered 2016-07-23 – 2016-07-25 (×3): via INTRAVENOUS
  Administered 2016-07-25: 125 mL/h via INTRAVENOUS
  Administered 2016-07-25 – 2016-07-29 (×5): via INTRAVENOUS

## 2016-07-23 MED ORDER — SUFENTANIL CITRATE 50 MCG/ML IV SOLN
INTRAVENOUS | Status: AC
  Filled 2016-07-23: qty 1

## 2016-07-23 MED ORDER — ONDANSETRON 4 MG PO TBDP
4.0000 mg | ORAL_TABLET | Freq: Four times a day (QID) | ORAL | Status: DC | PRN
  Filled 2016-07-23: qty 1

## 2016-07-23 MED ORDER — SODIUM CHLORIDE 0.9 % IJ SOLN
INTRAMUSCULAR | Status: AC
  Filled 2016-07-23: qty 10

## 2016-07-23 MED ORDER — ACETAMINOPHEN 10 MG/ML IV SOLN
1000.0000 mg | Freq: Four times a day (QID) | INTRAVENOUS | Status: AC
  Administered 2016-07-23 – 2016-07-24 (×4): 1000 mg via INTRAVENOUS
  Filled 2016-07-23 (×4): qty 100

## 2016-07-23 MED ORDER — SODIUM CHLORIDE 0.9 % IV BOLUS (SEPSIS)
1000.0000 mL | Freq: Once | INTRAVENOUS | Status: AC
  Administered 2016-07-23: 1000 mL via INTRAVENOUS

## 2016-07-23 MED ORDER — PIPERACILLIN-TAZOBACTAM 3.375 G IVPB 30 MIN
3.3750 g | Freq: Once | INTRAVENOUS | Status: AC
  Administered 2016-07-23: 3.375 g via INTRAVENOUS
  Filled 2016-07-23: qty 50

## 2016-07-23 MED ORDER — SODIUM CHLORIDE 0.9% FLUSH: 9.0000 mL | INTRAVENOUS | Status: DC | PRN

## 2016-07-23 MED ORDER — SUCCINYLCHOLINE CHLORIDE 200 MG/10ML IV SOSY
PREFILLED_SYRINGE | INTRAVENOUS | Status: AC
  Filled 2016-07-23: qty 40

## 2016-07-23 MED ORDER — SUGAMMADEX SODIUM 200 MG/2ML IV SOLN
INTRAVENOUS | Status: DC | PRN
  Administered 2016-07-23: 200 mg via INTRAVENOUS

## 2016-07-23 MED ORDER — METOPROLOL TARTRATE 5 MG/5ML IV SOLN
5.0000 mg | Freq: Four times a day (QID) | INTRAVENOUS | Status: DC
  Administered 2016-07-23 – 2016-07-27 (×15): 5 mg via INTRAVENOUS
  Filled 2016-07-23 (×15): qty 5

## 2016-07-23 MED ORDER — MENTHOL 3 MG MT LOZG
1.0000 | LOZENGE | OROMUCOSAL | Status: DC | PRN
  Administered 2016-07-23: 3 mg via ORAL
  Filled 2016-07-23: qty 9

## 2016-07-23 MED ORDER — NALOXONE HCL 0.4 MG/ML IJ SOLN: 0.4000 mg | INTRAMUSCULAR | Status: DC | PRN

## 2016-07-23 MED ORDER — FLUCONAZOLE IN SODIUM CHLORIDE 200-0.9 MG/100ML-% IV SOLN
200.0000 mg | Freq: Every day | INTRAVENOUS | Status: DC
  Administered 2016-07-23 – 2016-07-28 (×6): 200 mg via INTRAVENOUS
  Filled 2016-07-23 (×6): qty 100

## 2016-07-23 MED ORDER — IOPAMIDOL (ISOVUE-300) INJECTION 61%
INTRAVENOUS | Status: AC
  Administered 2016-07-23: 80 mL
  Filled 2016-07-23: qty 30

## 2016-07-23 MED ORDER — SUCCINYLCHOLINE CHLORIDE 20 MG/ML IJ SOLN
INTRAMUSCULAR | Status: DC | PRN
  Administered 2016-07-23: 140 mg via INTRAVENOUS

## 2016-07-23 MED ORDER — PHENYLEPHRINE HCL 10 MG/ML IJ SOLN
INTRAMUSCULAR | Status: DC | PRN
  Administered 2016-07-23 (×3): 80 ug via INTRAVENOUS

## 2016-07-23 MED ORDER — LIDOCAINE 2% (20 MG/ML) 5 ML SYRINGE
INTRAMUSCULAR | Status: AC
  Filled 2016-07-23: qty 5

## 2016-07-23 MED ORDER — VANCOMYCIN HCL IN DEXTROSE 750-5 MG/150ML-% IV SOLN
750.0000 mg | Freq: Two times a day (BID) | INTRAVENOUS | Status: DC
  Administered 2016-07-23 – 2016-07-26 (×6): 750 mg via INTRAVENOUS
  Filled 2016-07-23 (×7): qty 150

## 2016-07-23 MED ORDER — ONDANSETRON HCL 4 MG/2ML IJ SOLN: 4.0000 mg | Freq: Once | INTRAMUSCULAR | Status: DC | PRN

## 2016-07-23 MED ORDER — ONDANSETRON HCL 4 MG/2ML IJ SOLN
INTRAMUSCULAR | Status: AC
  Filled 2016-07-23: qty 8

## 2016-07-23 SURGICAL SUPPLY — 36 items
BLADE CLIPPER SURG (BLADE) IMPLANT
CANISTER SUCT 3000ML PPV (MISCELLANEOUS) ×3 IMPLANT
CHLORAPREP W/TINT 26ML (MISCELLANEOUS) ×3 IMPLANT
COVER SURGICAL LIGHT HANDLE (MISCELLANEOUS) ×3 IMPLANT
DRAPE LAPAROSCOPIC ABDOMINAL (DRAPES) ×3 IMPLANT
DRAPE WARM FLUID 44X44 (DRAPE) ×3 IMPLANT
ELECT BLADE 6.5 EXT (BLADE) ×2 IMPLANT
ELECT CAUTERY BLADE 6.4 (BLADE) ×3 IMPLANT
ELECT REM PT RETURN 9FT ADLT (ELECTROSURGICAL) ×3
ELECTRODE REM PT RTRN 9FT ADLT (ELECTROSURGICAL) ×1 IMPLANT
GLOVE BIO SURGEON STRL SZ 6 (GLOVE) ×3 IMPLANT
GLOVE BIOGEL PI IND STRL 6.5 (GLOVE) ×1 IMPLANT
GLOVE BIOGEL PI INDICATOR 6.5 (GLOVE) ×2
GOWN STRL REUS W/ TWL LRG LVL3 (GOWN DISPOSABLE) ×1 IMPLANT
GOWN STRL REUS W/TWL 2XL LVL3 (GOWN DISPOSABLE) ×3 IMPLANT
GOWN STRL REUS W/TWL LRG LVL3 (GOWN DISPOSABLE) ×3
KIT BASIN OR (CUSTOM PROCEDURE TRAY) ×3 IMPLANT
KIT ROOM TURNOVER OR (KITS) ×3 IMPLANT
LIGASURE IMPACT 36 18CM CVD LR (INSTRUMENTS) IMPLANT
NS IRRIG 1000ML POUR BTL (IV SOLUTION) ×6 IMPLANT
PACK GENERAL/GYN (CUSTOM PROCEDURE TRAY) ×3 IMPLANT
PAD ARMBOARD 7.5X6 YLW CONV (MISCELLANEOUS) ×5 IMPLANT
SPECIMEN JAR LARGE (MISCELLANEOUS) IMPLANT
SPONGE LAP 18X18 X RAY DECT (DISPOSABLE) ×4 IMPLANT
STAPLER VISISTAT 35W (STAPLE) ×3 IMPLANT
SUCTION POOLE TIP (SUCTIONS) ×3 IMPLANT
SUT PDS II 0 TP-1 LOOPED 60 (SUTURE) ×6 IMPLANT
SUT VIC AB 2-0 SH 18 (SUTURE) ×3 IMPLANT
SUT VIC AB 3-0 SH 18 (SUTURE) ×3 IMPLANT
SUT VICRYL 4-0 PS2 18IN ABS (SUTURE) IMPLANT
SUT VICRYL AB 2 0 TIES (SUTURE) ×3 IMPLANT
SUT VICRYL AB 3 0 TIES (SUTURE) ×3 IMPLANT
TOWEL OR 17X24 6PK STRL BLUE (TOWEL DISPOSABLE) ×3 IMPLANT
TOWEL OR 17X26 10 PK STRL BLUE (TOWEL DISPOSABLE) ×3 IMPLANT
TRAY FOLEY W/METER SILVER 16FR (SET/KITS/TRAYS/PACK) IMPLANT
YANKAUER SUCT BULB TIP NO VENT (SUCTIONS) IMPLANT

## 2016-07-23 NOTE — Plan of Care (Signed)
Problem: Bowel/Gastric: Goal: Gastrointestinal status for postoperative course will improve Outcome: Progressing Discussed with patient and family about protonix and plan of care for the evening  Comments: With some teach back displayed

## 2016-07-23 NOTE — Anesthesia Procedure Notes (Signed)
Procedure Name: Intubation Date/Time: 07/23/2016 5:15 AM Performed by: Claris Che Pre-anesthesia Checklist: Patient identified, Emergency Drugs available, Suction available and Patient being monitored Patient Re-evaluated:Patient Re-evaluated prior to inductionOxygen Delivery Method: Circle system utilized Preoxygenation: Pre-oxygenation with 100% oxygen Intubation Type: IV induction, Rapid sequence and Cricoid Pressure applied Laryngoscope Size: Mac and 4 Grade View: Grade III Tube type: Oral Tube size: 7.5 mm Number of attempts: 1 Airway Equipment and Method: Stylet Placement Confirmation: ETT inserted through vocal cords under direct vision,  positive ETCO2 and breath sounds checked- equal and bilateral Secured at: 24 cm Tube secured with: Tape Dental Injury: Teeth and Oropharynx as per pre-operative assessment

## 2016-07-23 NOTE — Progress Notes (Signed)
Pharmacy Antibiotic Note  Raymond Graham is a 66 y.o. male admitted on 07/22/2016 with peritonitis. He is s/p laparotomy and was found to have perforated pyloric channel ulcer. Pharmacy has been consulted for vancomycin dosing.  Patient is currently afebrile with WBC within normal limits. His serum creatinine is elevated at 1.71, which is above is baseline of ~1. CrCL ~40-45 mL/min. Will not load, it appears he may have received 1gm IV pre-op.  Plan: Vancomycin  IV every 12 hours Zosyn 3.375gm IV every 8 hours per MD Monitor renal function, clinical progress, VT as indicated  Height:  (172.7 cm) Weight: 200 lb (90.7 kg) IBW/kg (Calculated) : 68.4  Temp (24hrs), Avg:98 F (36.7 C), Min:97.6 F (36.4 C), Max:98.3 F (36.8 C)   Recent Labs Lab 07/22/16 2350 07/23/16 0010  WBC 10.4  --   CREATININE 1.71*  --   LATICACIDVEN  --  1.19    Estimated Creatinine Clearance: 45.8 mL/min (A) (by C-G formula based on SCr of 1.71 mg/dL (H)).    No Known Allergies  Antimicrobials this admission: 4/14 zosyn >>  4/14 vanc >>   Microbiology results:  4/14 MRSA PCR: sent  Thank you for allowing pharmacy to be a part of this patient's care.  Carylon Perches, PharmD Acute Care Pharmacy Resident  Pager: [phone] 07/23/2016

## 2016-07-23 NOTE — Op Note (Signed)
PRE-OPERATIVE DIAGNOSIS: perforated viscus  POST-OPERATIVE DIAGNOSIS:  Perforated pyloric channel ulcer  PROCEDURE:  Procedure(s): Exploratory laparotomy, graham patch repair of perforated pyloric channel ulcer, liver biopsy  SURGEON:  Surgeon(s): Almond Lint, MD  ANESTHESIA:   general  DRAINS: none   LOCAL MEDICATIONS USED:  NONE  SPECIMEN:  Liver nodule  DISPOSITION OF SPECIMEN:  PATHOLOGY  COUNTS:  YES  DICTATION: .Dragon Dictation  PLAN OF CARE: Admit to inpatient   PATIENT DISPOSITION:  PACU - hemodynamically stable.  FINDINGS:  Significant contamination of gastric contents, perforated ulcer, 2 mm nodule on left lateral segment  EBL: 50 mL  PROCEDURE:  Patient was identified in the holding area and taken to the operating room where he was placed supine on the operating room table. General endotracheal anesthesia was induced. Foley catheter was placed and his abdomen was prepped and draped in sterile fashion. A timeout was performed according to the surgical safety checklist. When all was correct, we continued.  An upper midline incision was made from the subxiphoid location to just above the umbilicus. The cautery was used to divide the subcutaneous tissue and the fascia. The peritoneum was entered high in the incision and gastric contamination was apparent. This was aspirated. The Balfour retractor was placed to assist with visualization. The perforated ulcer was immediately visible anteriorly at the pyloric channel. This was repaired by using interrupted silk mattress sutures to lay the omentum across the defect. These were secured down gently. The abdomen was then copiously irrigated.   There was a firm 2 mm nodule on the surface of the liver. This was removed and hemostasis was achieved with cautery.  The position of the NG tube was confirmed in the stomach. The patch was reexamined to make sure that the omental patch had not been dislodged during the irrigation. At  this point, the fascia was closed using a running #1 looped PDS suture 2. The patient had an umbilical hernia just below his incision. This was closed in conjunction with closure of the fascia.  The skin was then irrigated and closed with skin staples. The patient's abdomen was then cleaned, dried, and dressed with a honeycomb dressing. The patient was allowed to emerge from anesthesia and taken to the PACU in stable condition. Needle, sponge, and instrument counts were correct 2.

## 2016-07-23 NOTE — Anesthesia Preprocedure Evaluation (Signed)
Anesthesia Evaluation  Patient identified by MRN, date of birth, ID band Patient awake    Reviewed: Allergy & Precautions, NPO status , Patient's Chart, lab work & pertinent test results  Airway Mallampati: II  TM Distance: >3 FB Neck ROM: Full    Dental   Pulmonary former smoker,    Pulmonary exam normal        Cardiovascular hypertension, Normal cardiovascular exam     Neuro/Psych    GI/Hepatic GERD  Medicated and Controlled,  Endo/Other    Renal/GU      Musculoskeletal   Abdominal   Peds  Hematology   Anesthesia Other Findings   Reproductive/Obstetrics                             Anesthesia Physical Anesthesia Plan  ASA: III and emergent  Anesthesia Plan: General   Post-op Pain Management:    Induction: Intravenous  Airway Management Planned: Oral ETT  Additional Equipment:   Intra-op Plan:   Post-operative Plan: Extubation in OR  Informed Consent: I have reviewed the patients History and Physical, chart, labs and discussed the procedure including the risks, benefits and alternatives for the proposed anesthesia with the patient or authorized representative who has indicated his/her understanding and acceptance.     Plan Discussed with: CRNA and Surgeon  Anesthesia Plan Comments:         Anesthesia Quick Evaluation

## 2016-07-23 NOTE — Anesthesia Postprocedure Evaluation (Signed)
Anesthesia Post Note  Patient: DANIS PEMBLETON  Procedure(s) Performed: Procedure(s) (LRB): EXPLORATORY LAPAROTOMY WITH GRAHAM PATCH REPAIR OF PYLORIC ULCER (N/A)  Patient location during evaluation: PACU Anesthesia Type: General Level of consciousness: awake and alert Pain management: pain level controlled Vital Signs Assessment: post-procedure vital signs reviewed and stable Respiratory status: spontaneous breathing, nonlabored ventilation, respiratory function stable and patient connected to nasal cannula oxygen Cardiovascular status: blood pressure returned to baseline and stable Postop Assessment: no signs of nausea or vomiting Anesthetic complications: no       Last Vitals:  Vitals:   07/23/16 1300 07/23/16 1600  BP: 112/77 127/78  Pulse:    Resp: 15 20  Temp:      Last Pain:  Vitals:   07/23/16 1600  TempSrc:   PainSc: 0-No pain                 Jerelle Virden DAVID

## 2016-07-23 NOTE — Transfer of Care (Signed)
Immediate Anesthesia Transfer of Care Note  Patient: Raymond Graham  Procedure(s) Performed: Procedure(s): EXPLORATORY LAPAROTOMY WITH GRAHAM PATCH REPAIR OF PYLORIC ULCER (N/A)  Patient Location: PACU  Anesthesia Type:General  Level of Consciousness: sedated, drowsy, patient cooperative and responds to stimulation  Airway & Oxygen Therapy: Patient Spontanous Breathing and Patient connected to nasal cannula oxygen  Post-op Assessment: Report given to RN, Post -op Vital signs reviewed and stable and Patient moving all extremities X 4  Post vital signs: Reviewed and stable  Last Vitals:  Vitals:   07/23/16 0345 07/23/16 0400  BP: 129/75 124/80  Pulse: 71 68  Resp: 18 18  Temp:      Last Pain:  Vitals:   07/23/16 0345  TempSrc:   PainSc: 10-Worst pain ever         Complications: No apparent anesthesia complications

## 2016-07-23 NOTE — H&P (Signed)
Raymond Graham is an 66 y.o. male.   Chief Complaint: abdominal pain HPI:  Pt is a 66 yo M who presents with progressive abdominal discomfort and bloating for 3 weeks with constipation.  He has tried numerous laxatives and other over the counter remedies.  He developed sudden increase in abdominal pain last night and his wife called EMS.  He also started having dark emesis.  During transfer, he had decreased mental status and bradycardia which resolved spontaneously.  He denies fever/chills/jaundice.  He has not had weight loss.  He has had an EGD in 2016.    Past Medical History:  Diagnosis Date  . 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 BPH (benign prostatic hypertrophy)    PT ON FLOMAX-DR. WRENN IS PT'S UROLOGIST  . GERD (gastroesophageal reflux disease)    OCAS-NO MEDS  . Hypertension   . Seasonal allergies     Past Surgical History:  Procedure Laterality Date  . BACK SURGERY  10/2008   LUMBAR SURGERY FOR STENOSIS  . JOINT REPLACEMENT  2005   RIGHT HIP REPLACEMENT   . LEFT BUNIONECTOMY    . LEFT GANGLION CYST REMOVED    . TOTAL HIP ARTHROPLASTY  05/31/2011   Procedure: TOTAL HIP ARTHROPLASTY ANTERIOR APPROACH;  Surgeon: Mauri Pole, MD;  Location: [facility];  Service: Orthopedics;  Laterality: Left;  . TOTAL KNEE ARTHROPLASTY  11/21/2011   Procedure: TOTAL KNEE ARTHROPLASTY;  Surgeon: Mauri Pole, MD;  Location: [facility];  Service: Orthopedics;  Laterality: Left;    No family history on file. Social History:  reports that he quit smoking about 25 years ago. He has never used smokeless tobacco. He reports that he drinks about 1.2 oz of alcohol per week . He reports that he does not use drugs.  Allergies: No Known Allergies  Medications: Outpatient Medications   Cholecalciferol (VITAMIN D) 2000 UNITS CAPS    cyclobenzaprine (FLEXERIL) 10 MG tablet    ferrous sulfate 325 (65 FE) MG tablet(Expired)    fish oil-omega-3 fatty acids 1000 MG capsule    losartan-hydrochlorothiazide (HYZAAR) 50-12.5 MG per tablet    Lycopene 10 MG CAPS    metoprolol succinate (TOPROL-XL) 100 MG 24 hr tablet    mometasone (NASONEX) 50 MCG/ACT nasal spray    Multiple Vitamin (MULTIVITAMIN WITH MINERALS) TABS    PRESCRIPTION MEDICATION    Tamsulosin HCl (FLOMAX) 0.4 MG CAPS    traMADol (ULTRAM) 50 MG tablet      Results for orders placed or performed during the hospital encounter of 07/22/16 (from the past 48 hour(s))  I-stat troponin, ED     Status: None   Collection Time: 07/22/16 11:27 PM  Result Value Ref Range   Troponin i, poc 0.00 0.00 - 0.08 ng/mL   Comment 3            Comment: Due to the release kinetics of cTnI, a negative result within the first hours of the onset of symptoms does not rule out myocardial infarction with certainty. If myocardial infarction is still suspected, repeat the test at appropriate intervals.   Lipase, blood     Status: Abnormal   Collection Time: 07/22/16 11:50 PM  Result Value Ref Range   Lipase 67 (H) 11 - 51 U/L  Comprehensive metabolic panel     Status: Abnormal   Collection Time: 07/22/16 11:50 PM  Result Value Ref Range   Sodium 137 135 - 145 mmol/L   Potassium  4.0 3.5 - 5.1 mmol/L   Chloride 100 (L) 101 - 111 mmol/L   CO2 28 22 - 32 mmol/L   Glucose, Bld 141 (H) 65 - 99 mg/dL   BUN 15 6 - 20 mg/dL   Creatinine, Ser 1.71 (H) 0.61 - 1.24 mg/dL   Calcium 9.3 8.9 - 10.3 mg/dL   Total Protein 6.9 6.5 - 8.1 g/dL   Albumin 3.7 3.5 - 5.0 g/dL   AST 25 15 - 41 U/L   ALT 21 17 - 63 U/L   Alkaline Phosphatase 67 38 - 126 U/L   Total Bilirubin 0.8 0.3 - 1.2 mg/dL   GFR calc non Af Amer 40 (L) >60 mL/min   GFR calc Af Amer 46 (L) >60 mL/min    Comment: (NOTE) The eGFR has been calculated using the CKD EPI equation. This calculation has not been validated in all clinical situations. eGFR's persistently <60 mL/min signify possible Chronic Kidney Disease.    Anion gap 9 5 - 15  CBC     Status: None    Collection Time: 07/22/16 11:50 PM  Result Value Ref Range   WBC 10.4 4.0 - 10.5 K/uL   RBC 4.29 4.22 - 5.81 MIL/uL   Hemoglobin 13.1 13.0 - 17.0 g/dL   HCT 39.1 39.0 - 52.0 %   MCV 91.1 78.0 - 100.0 fL   MCH 30.5 26.0 - 34.0 pg   MCHC 33.5 30.0 - 36.0 g/dL   RDW 13.9 11.5 - 15.5 %   Platelets 206 150 - 400 K/uL  Type and screen     Status: None   Collection Time: 07/22/16 11:50 PM  Result Value Ref Range   ABO/RH(D) AB POS    Antibody Screen NEG    Sample Expiration 07/25/2016   ABO/Rh     Status: None   Collection Time: 07/22/16 11:50 PM  Result Value Ref Range   ABO/RH(D) AB POS   POC occult blood, ED     Status: None   Collection Time: 07/23/16 12:10 AM  Result Value Ref Range   Fecal Occult Bld NEGATIVE NEGATIVE  I-Stat CG4 Lactic Acid, ED     Status: None   Collection Time: 07/23/16 12:10 AM  Result Value Ref Range   Lactic Acid, Venous 1.19 0.5 - 1.9 mmol/L   Ct Abdomen Pelvis W Contrast  Result Date: 07/23/2016 CLINICAL DATA:  Diffuse abdominal pain, pneumoperitoneum EXAM: CT ABDOMEN AND PELVIS WITH CONTRAST TECHNIQUE: Multidetector CT imaging of the abdomen and pelvis was performed using the standard protocol following bolus administration of intravenous contrast. CONTRAST:  49m ISOVUE-300 IOPAMIDOL (ISOVUE-300) INJECTION 61% COMPARISON:  None. FINDINGS: There is pneumoperitoneum noted largely in the upper abdomen with small amount free fluid overlying the liver and spleen. Suspect an upper abdominal source for the perforation possibly a perforated gastric or duodenal ulcer given the mesenteric induration and edema seen more so in the right upper quadrant. Lower chest: Normal size cardiac chambers. Trace right pleural effusion. Bibasilar atelectasis. Hepatobiliary: No space-occupying mass of the liver. There is a small amount of free fluid draped over the liver Pancreas: Unremarkable. No pancreatic ductal dilatation or surrounding inflammatory changes. Spleen: Normal in size  without focal abnormality. Adrenals/Urinary Tract: Adrenal glands are unremarkable. Kidneys are normal, without renal calculi, focal lesion, or hydronephrosis. Bladder is unremarkable. Stomach/Bowel: The stomach is contrast distended. No extravasation of ingested enteric contrast is seen. Contrast passes into the duodenum. There is no small bowel dilatation, fold nor mural thickening. A  few scattered colonic diverticular are noted along the transverse colon however they are more conspicuous along the distal descending and sigmoid colon. No significant collections of air are identified along the left colon to suggest that this is the etiology for the free air. Normal appearing appendix. Vascular/Lymphatic: Aortic atherosclerosis. No enlarged abdominal or pelvic lymph nodes. Reproductive: Prostate is unremarkable. Other: A fat containing umbilical hernia. Some free air seen within the umbilical hernia as well. Musculoskeletal: Bilateral hip arthroplasties are noted. Degenerative disc disease at L5-S1. No acute nor suspicious osseous abnormality. IMPRESSION: 1. Pneumoperitoneum is confirmed likely from an upper abdominal source possibly from the right upper quadrant given the soft tissue induration seen as well as small amount of free fluid outlining the liver and to a lesser degree the spleen. No extravasation of the ingested enteric contrast is noted and findings may be due to a sealed perforation. Critical Value/emergent results were called by telephone at the time of interpretation on 07/23/2016 at 3:15 am to Dr. Ripley Fraise , who verbally acknowledged these results. 2. Distal descending and sigmoid diverticulosis without acute diverticulitis. Given the lack of free air surrounding a segment of bowel, it is less likely that a perforated diverticulum from this location is the etiology. Electronically Signed   By: Ashley Royalty M.D.   On: 07/23/2016 03:25   Dg Chest Port 1 View  Result Date: 07/23/2016 CLINICAL  DATA:  Pain x1 day EXAM: PORTABLE CHEST 1 VIEW COMPARISON:  05/25/2011 CXR FINDINGS: AP view of the chest was acquired portably. Mild cardiac enlargement may be due to the AP technique. Tortuous atherosclerotic aorta without aneurysm. Mild central vascular congestion without pneumonic consolidation, effusion or pneumothorax. Minimal atelectasis and/or scarring at the left base. No acute nor suspicious osseous abnormality. Osteoarthritis of both glenohumeral joints with joint space narrowing and sclerosis and spurring off the inferomedial aspect of both humeral heads. Rotator cuff calcific tendinopathy is seen adjacent to the left humeral head. IMPRESSION: Mild central vascular congestion with borderline cardiomegaly. Aortic atherosclerosis. Osteoarthritis of both glenohumeral joints. Electronically Signed   By: Ashley Royalty M.D.   On: 07/23/2016 00:58   Dg Abd Portable 2 Views  Result Date: 07/23/2016 CLINICAL DATA:  Abdominal pain for 1 day EXAM: PORTABLE ABDOMEN - 2 VIEW COMPARISON:  None. FINDINGS: Crescentic subdiaphragmatic appearing air like lucency is seen on the upright view of the abdomen. Findings are concerning for possible pneumoperitoneum. Bowel gas pattern is unremarkable. No organomegaly is seen. There is lower lumbar degenerative disc and facet arthropathy. Bilateral total hip arthroplasties are also partially imaged. No radiopaque calculi. IMPRESSION: Crescentic air like lucency on the upright projection beneath right hemidiaphragm is noted. Pneumoperitoneum is not excluded. CT of the abdomen and pelvis is therefore recommended for further correlation. Critical Value/emergent results were called by telephone at the time of interpretation on 07/23/2016 at 1:01 am to Dr. Ripley Fraise , who verbally acknowledged these results. Electronically Signed   By: Ashley Royalty M.D.   On: 07/23/2016 01:02    Review of Systems  Unable to perform ROS: Mental status change (portions obtained from wife and  medical record.  Pt just received pain medication prior to my arrival.  )  Constitutional: Negative.   Eyes: Negative.   Respiratory: Negative.   Cardiovascular: Negative.   Gastrointestinal: Positive for abdominal pain, constipation, nausea and vomiting.  Genitourinary: Negative.   Musculoskeletal: Positive for joint pain (chronic).  Neurological: Negative.   Endo/Heme/Allergies: Negative.   Psychiatric/Behavioral: Negative.  Blood pressure 124/80, pulse 68, temperature 97.6 F (36.4 C), temperature source Oral, resp. rate 18, height '5\' 8"'$  (1.727 m), weight 90.7 kg (200 lb), SpO2 99 %. Physical Exam  Constitutional: He appears well-developed and well-nourished. He appears distressed.  HENT:  Head: Normocephalic and atraumatic.  Eyes: Conjunctivae are normal. Pupils are equal, round, and reactive to light. No scleral icterus.  Neck: Neck supple. No thyromegaly present.  Cardiovascular: Normal rate and intact distal pulses.   Respiratory: Effort normal. No respiratory distress. He exhibits no tenderness.  GI: Soft. He exhibits distension. There is tenderness. There is rebound and guarding.  Musculoskeletal: He exhibits no edema or tenderness.  Neurological: Coordination normal.  Sleepy from pain medication.  Skin: Skin is warm and dry. No rash noted. He is not diaphoretic. No erythema. No pallor.  Psychiatric:  Normal judgement exhibited by coming to ED      Assessment/Plan Perforated viscus Peritonitis Osteoarthritis Constipation   Ct suspicious for perforated ulcer given location of air and fluid.   Constipation more concerning for large bowel source.  NPO IV Fluid resuscitation IV antibiotics To OR for exploratory laparotomy  Discussed with patient and wife.  Will plan ex lap and probable patch of ulcer.  Reviewed risks and discussed that sometimes we find perforated bowel instead.  Discussed risk of ICU/ventilator, heart or lung problems, possible need for  additional procedures or surgery, and more.  Will plan emergent ex lap.     Stark Klein, MD 07/23/2016, 4:29 AM

## 2016-07-24 ENCOUNTER — Encounter (HOSPITAL_COMMUNITY): Payer: Self-pay | Admitting: General Surgery

## 2016-07-24 LAB — CBC
HEMATOCRIT: 34 % — AB (ref 39.0–52.0)
Hemoglobin: 11.1 g/dL — ABNORMAL LOW (ref 13.0–17.0)
MCH: 29.4 pg (ref 26.0–34.0)
MCHC: 32.6 g/dL (ref 30.0–36.0)
MCV: 90.2 fL (ref 78.0–100.0)
PLATELETS: 193 10*3/uL (ref 150–400)
RBC: 3.77 MIL/uL — AB (ref 4.22–5.81)
RDW: 13.6 % (ref 11.5–15.5)
WBC: 12.6 10*3/uL — AB (ref 4.0–10.5)

## 2016-07-24 LAB — URINALYSIS, ROUTINE W REFLEX MICROSCOPIC
BACTERIA UA: NONE SEEN
Bilirubin Urine: NEGATIVE
GLUCOSE, UA: 150 mg/dL — AB
Ketones, ur: NEGATIVE mg/dL
LEUKOCYTES UA: NEGATIVE
Nitrite: NEGATIVE
PROTEIN: 30 mg/dL — AB
SQUAMOUS EPITHELIAL / LPF: NONE SEEN
Specific Gravity, Urine: 1.029 (ref 1.005–1.030)
pH: 5 (ref 5.0–8.0)

## 2016-07-24 LAB — BASIC METABOLIC PANEL
ANION GAP: 6 (ref 5–15)
BUN: 14 mg/dL (ref 6–20)
CO2: 24 mmol/L (ref 22–32)
Calcium: 8 mg/dL — ABNORMAL LOW (ref 8.9–10.3)
Chloride: 105 mmol/L (ref 101–111)
Creatinine, Ser: 1.32 mg/dL — ABNORMAL HIGH (ref 0.61–1.24)
GFR calc Af Amer: >60 mL/min (ref >60)
GFR calc non Af Amer: 54 mL/min — ABNORMAL LOW (ref >60)
GLUCOSE: 144 mg/dL — AB (ref 65–99)
POTASSIUM: 4.1 mmol/L (ref 3.5–5.1)
Sodium: 135 mmol/L (ref 135–145)

## 2016-07-24 MED ORDER — HYDROMORPHONE 1 MG/ML IV SOLN
INTRAVENOUS | Status: DC
  Administered 2016-07-24: 19:00:00 via INTRAVENOUS
  Administered 2016-07-24: 1 mg via INTRAVENOUS
  Administered 2016-07-25: 1.2 mg via INTRAVENOUS
  Administered 2016-07-25: 0.6 mg via INTRAVENOUS
  Administered 2016-07-25 (×2): 1.2 mg via INTRAVENOUS
  Administered 2016-07-25: 0.2 mg via INTRAVENOUS
  Administered 2016-07-25: 1.5 mg via INTRAVENOUS
  Administered 2016-07-26: 1.8 mg via INTRAVENOUS
  Administered 2016-07-26: 0.8 mg via INTRAVENOUS
  Administered 2016-07-26: 0.799 mg via INTRAVENOUS
  Administered 2016-07-27: 1.2 mg via INTRAVENOUS
  Administered 2016-07-27: 1 mg via INTRAVENOUS
  Filled 2016-07-24: qty 25

## 2016-07-24 MED ORDER — HYDROMORPHONE HCL 1 MG/ML IJ SOLN
0.5000 mg | INTRAMUSCULAR | Status: DC | PRN
  Administered 2016-07-24: 2 mg via INTRAVENOUS
  Filled 2016-07-24: qty 2

## 2016-07-24 MED ORDER — SODIUM CHLORIDE 0.9% FLUSH: 9.0000 mL | INTRAVENOUS | Status: DC | PRN

## 2016-07-24 MED ORDER — DIPHENHYDRAMINE HCL 12.5 MG/5ML PO ELIX: 12.5000 mg | ORAL_SOLUTION | Freq: Four times a day (QID) | ORAL | Status: DC | PRN

## 2016-07-24 MED ORDER — NALOXONE HCL 0.4 MG/ML IJ SOLN: 0.4000 mg | INTRAMUSCULAR | Status: DC | PRN

## 2016-07-24 MED ORDER — ENOXAPARIN SODIUM 40 MG/0.4ML ~~LOC~~ SOLN
40.0000 mg | SUBCUTANEOUS | Status: DC
  Administered 2016-07-24 – 2016-07-29 (×6): 40 mg via SUBCUTANEOUS
  Filled 2016-07-24 (×6): qty 0.4

## 2016-07-24 MED ORDER — DIPHENHYDRAMINE HCL 50 MG/ML IJ SOLN: 12.5000 mg | Freq: Four times a day (QID) | INTRAMUSCULAR | Status: DC | PRN

## 2016-07-24 MED ORDER — PHENOL 1.4 % MT LIQD
2.0000 | OROMUCOSAL | Status: DC | PRN
  Filled 2016-07-24: qty 177

## 2016-07-24 MED ORDER — ONDANSETRON HCL 4 MG/2ML IJ SOLN
4.0000 mg | Freq: Four times a day (QID) | INTRAMUSCULAR | Status: DC | PRN
  Administered 2016-07-25: 4 mg via INTRAVENOUS
  Filled 2016-07-24: qty 2

## 2016-07-24 NOTE — Plan of Care (Signed)
Problem: Education: Goal: Knowledge of [facility] Education information/materials will improve Outcome: Progressing Discussed new PCA medications and transfer to another floor with some teach back displayed.

## 2016-07-24 NOTE — Progress Notes (Signed)
[facility] Progress Note  1 Day Post-Op  Subjective: CC: perforated pyloric ulcer Pain controlled - reports abdominal soreness with coughing or movement. Denies flatus or BM. Denies fever, chills, nausea, or vomiting. Slept poorly last night. Has not ambulated in the hall since surgery.   Objective: Vital signs in last 24 hours: Temp:  [98 F (36.7 C)-98.8 F (37.1 C)] 98.3 F (36.8 C) (04/15 0400) Pulse Rate:  [55-67] 67 (04/15 0400) Resp:  [12-30] 20 (04/15 0600) BP: (102-141)/(68-93) 128/81 (04/15 0600) SpO2:  [96 %-100 %] 98 % (04/15 0400) Last BM Date: 07/22/16  Intake/Output from previous day: 04/14 0701 - 04/15 0700 In: 3010.4 [I.V.:2060.4; IV Piggyback:950] Out: 1650 [Urine:1550; Emesis/NG output:100] Intake/Output this shift: No intake/output data recorded.  PE: Gen:  Alert, NAD, pleasant Card:  Regular rate and rhythm Pulm:  Normal respiratory effort, clear to auscultation bilaterally Abd: Soft, appropriately tender, mild distention, bowel sounds present, incisions C/D/I  NGT: 100cc/24h Ext:  No erythema, edema, or tenderness   Lab Results:   Recent Labs  07/22/16 2350 07/24/16 0211  WBC 10.4 12.6*  HGB 13.1 11.1*  HCT 39.1 34.0*  PLT 206 193   BMET  Recent Labs  07/22/16 2350 07/24/16 0211  NA 137 135  K 4.0 4.1  CL 100* 105  CO2 28 24  GLUCOSE 141* 144*  BUN 15 14  CREATININE 1.71* 1.32*  CALCIUM 9.3 8.0*   PT/INR No results for input(s): LABPROT, INR in the last 72 hours. CMP     Component Value Date/Time   NA 135 07/24/2016 0211   K 4.1 07/24/2016 0211   CL 105 07/24/2016 0211   CO2 24 07/24/2016 0211   GLUCOSE 144 (H) 07/24/2016 0211   BUN 14 07/24/2016 0211   CREATININE 1.32 (H) 07/24/2016 0211   CALCIUM 8.0 (L) 07/24/2016 0211   PROT 6.9 07/22/2016 2350   ALBUMIN 3.7 07/22/2016 2350   AST 25 07/22/2016 2350   ALT 21 07/22/2016 2350   ALKPHOS 67 07/22/2016 2350   BILITOT 0.8 07/22/2016 2350   GFRNONAA 54 (L)  07/24/2016 0211   GFRAA >60 07/24/2016 0211   Lipase     Component Value Date/Time   LIPASE 67 (H) 07/22/2016 2350   Studies/Results: Ct Abdomen Pelvis W Contrast  Result Date: 07/23/2016 CLINICAL DATA:  Diffuse abdominal pain, pneumoperitoneum EXAM: CT ABDOMEN AND PELVIS WITH CONTRAST TECHNIQUE: Multidetector CT imaging of the abdomen and pelvis was performed using the standard protocol following bolus administration of intravenous contrast. CONTRAST:  80mL ISOVUE-300 IOPAMIDOL (ISOVUE-300) INJECTION 61% COMPARISON:  None. FINDINGS: There is pneumoperitoneum noted largely in the upper abdomen with small amount free fluid overlying the liver and spleen. Suspect an upper abdominal source for the perforation possibly a perforated gastric or duodenal ulcer given the mesenteric induration and edema seen more so in the right upper quadrant. Lower chest: Normal size cardiac chambers. Trace right pleural effusion. Bibasilar atelectasis. Hepatobiliary: No space-occupying mass of the liver. There is a small amount of free fluid draped over the liver Pancreas: Unremarkable. No pancreatic ductal dilatation or surrounding inflammatory changes. Spleen: Normal in size without focal abnormality. Adrenals/Urinary Tract: Adrenal glands are unremarkable. Kidneys are normal, without renal calculi, focal lesion, or hydronephrosis. Bladder is unremarkable. Stomach/Bowel: The stomach is contrast distended. No extravasation of ingested enteric contrast is seen. Contrast passes into the duodenum. There is no small bowel dilatation, fold nor mural thickening. A few scattered colonic diverticular are noted along the transverse colon however they  are more conspicuous along the distal descending and sigmoid colon. No significant collections of air are identified along the left colon to suggest that this is the etiology for the free air. Normal appearing appendix. Vascular/Lymphatic: Aortic atherosclerosis. No enlarged abdominal or  pelvic lymph nodes. Reproductive: Prostate is unremarkable. Other: A fat containing umbilical hernia. Some free air seen within the umbilical hernia as well. Musculoskeletal: Bilateral hip arthroplasties are noted. Degenerative disc disease at L5-S1. No acute nor suspicious osseous abnormality. IMPRESSION: 1. Pneumoperitoneum is confirmed likely from an upper abdominal source possibly from the right upper quadrant given the soft tissue induration seen as well as small amount of free fluid outlining the liver and to a lesser degree the spleen. No extravasation of the ingested enteric contrast is noted and findings may be due to a sealed perforation. Critical Value/emergent results were called by telephone at the time of interpretation on 07/23/2016 at 3:15 am to Dr. Zadie Rhine , who verbally acknowledged these results. 2. Distal descending and sigmoid diverticulosis without acute diverticulitis. Given the lack of free air surrounding a segment of bowel, it is less likely that a perforated diverticulum from this location is the etiology. Electronically Signed   By: Tollie Eth M.D.   On: 07/23/2016 03:25   Dg Chest Port 1 View  Result Date: 07/23/2016 CLINICAL DATA:  Pain x1 day EXAM: PORTABLE CHEST 1 VIEW COMPARISON:  05/25/2011 CXR FINDINGS: AP view of the chest was acquired portably. Mild cardiac enlargement may be due to the AP technique. Tortuous atherosclerotic aorta without aneurysm. Mild central vascular congestion without pneumonic consolidation, effusion or pneumothorax. Minimal atelectasis and/or scarring at the left base. No acute nor suspicious osseous abnormality. Osteoarthritis of both glenohumeral joints with joint space narrowing and sclerosis and spurring off the inferomedial aspect of both humeral heads. Rotator cuff calcific tendinopathy is seen adjacent to the left humeral head. IMPRESSION: Mild central vascular congestion with borderline cardiomegaly. Aortic atherosclerosis.  Osteoarthritis of both glenohumeral joints. Electronically Signed   By: Tollie Eth M.D.   On: 07/23/2016 00:58   Dg Abd Portable 2 Views  Result Date: 07/23/2016 CLINICAL DATA:  Abdominal pain for 1 day EXAM: PORTABLE ABDOMEN - 2 VIEW COMPARISON:  None. FINDINGS: Crescentic subdiaphragmatic appearing air like lucency is seen on the upright view of the abdomen. Findings are concerning for possible pneumoperitoneum. Bowel gas pattern is unremarkable. No organomegaly is seen. There is lower lumbar degenerative disc and facet arthropathy. Bilateral total hip arthroplasties are also partially imaged. No radiopaque calculi. IMPRESSION: Crescentic air like lucency on the upright projection beneath right hemidiaphragm is noted. Pneumoperitoneum is not excluded. CT of the abdomen and pelvis is therefore recommended for further correlation. Critical Value/emergent results were called by telephone at the time of interpretation on 07/23/2016 at 1:01 am to Dr. Zadie Rhine , who verbally acknowledged these results. Electronically Signed   By: Tollie Eth M.D.   On: 07/23/2016 01:02    Anti-infectives: Anti-infectives    Start     Dose/Rate Route Frequency Ordered Stop   07/23/16 1500  piperacillin-tazobactam (ZOSYN) IVPB 3.375 g     3.375 g 12.5 mL/hr over 240 Minutes Intravenous Every 8 hours 07/23/16 1149     07/23/16 1500  vancomycin (VANCOCIN) IVPB 750 mg/150 ml premix     750 mg 150 mL/hr over 60 Minutes Intravenous Every 12 hours 07/23/16 1335     07/23/16 0715  piperacillin-tazobactam (ZOSYN) IVPB 3.375 g  Status:  Discontinued  3.375 g 12.5 mL/hr over 240 Minutes Intravenous Every 8 hours 07/23/16 0708 07/23/16 1149   07/23/16 0500  vancomycin (VANCOCIN) IVPB 1000 mg/200 mL premix     1,000 mg 200 mL/hr over 60 Minutes Intravenous To Short Stay 07/23/16 0429 07/24/16 0500   07/23/16 0500  fluconazole (DIFLUCAN) IVPB 200 mg     200 mg 100 mL/hr over 60 Minutes Intravenous Daily 07/23/16 0429      07/23/16 0130  piperacillin-tazobactam (ZOSYN) IVPB 3.375 g     3.375 g 100 mL/hr over 30 Minutes Intravenous  Once 07/23/16 0123 07/23/16 6010     Assessment/Plan Perforated pyloric ulcer S/P Exploratory laparotomy, graham patch repair of perforated pyloric channel ulcer, liver biopsy, 07/23/16 Dr. Donell Beers  - NPO, IVF, NGT to [facility]  - PPI therapy  - pain control - IS/Pulm toilet  - UGI early next week to r/o leak prior to initiating PO intake   ABL anemia: hgb 11.1, repeat CBC in AM HTN: IV lopressor, hydralazine; resume home meds when tolerating PO AKI: Creatinine 1.32 from 1.71; continue IVF and repeat BMET in AM EtOH use: CIWA FEN: NPO, IVF, electrolytes stable  ID: Vanc 4/14>>, Zosyn 4/14 >>, fluconazole 4/14 >> (will discuss narrowing abx coverage with MD) VTE: SCD's, Lovenox Pain: Reduced dose Morphine PCA, Robaxin PRN Dispo: transfer to the floor, mobilize    LOS: 1 day    Adam Phenix , [facility] 07/24/2016, 8:40 AM Pager: [phone] Consults: [phone] Mon-Fri 7:00 am-4:30 pm Sat-Sun 7:00 am-11:30 am

## 2016-07-24 NOTE — Progress Notes (Signed)
Ambulated with assistance approximately 400 ft.  Tolerated well, pulse ox 99% on room air.

## 2016-07-25 LAB — BASIC METABOLIC PANEL
ANION GAP: 6 (ref 5–15)
BUN: 11 mg/dL (ref 6–20)
CO2: 28 mmol/L (ref 22–32)
Calcium: 8.5 mg/dL — ABNORMAL LOW (ref 8.9–10.3)
Chloride: 103 mmol/L (ref 101–111)
Creatinine, Ser: 1.2 mg/dL (ref 0.61–1.24)
Glucose, Bld: 132 mg/dL — ABNORMAL HIGH (ref 65–99)
POTASSIUM: 3.8 mmol/L (ref 3.5–5.1)
Sodium: 137 mmol/L (ref 135–145)

## 2016-07-25 LAB — CBC
HCT: 34.8 % — ABNORMAL LOW (ref 39.0–52.0)
HEMOGLOBIN: 11.3 g/dL — AB (ref 13.0–17.0)
MCH: 29.8 pg (ref 26.0–34.0)
MCHC: 32.5 g/dL (ref 30.0–36.0)
MCV: 91.8 fL (ref 78.0–100.0)
PLATELETS: 209 10*3/uL (ref 150–400)
RBC: 3.79 MIL/uL — AB (ref 4.22–5.81)
RDW: 14.1 % (ref 11.5–15.5)
WBC: 9.2 10*3/uL (ref 4.0–10.5)

## 2016-07-25 NOTE — Care Management Note (Signed)
Case Management Note Donn Pierini RN, BSN Unit 2W-Case Manager [phone] Covering [id number]  Patient Details  Name: Raymond Graham MRN: [id number] Date of Birth: 1950/11/02  Subjective/Objective:  Pt admitted with perforated pyloric ulcer-  S/P Exploratory laparotomy, graham patch repair of perforated pyloric channel ulcer, liver biopsy, 07/23/16 Dr. Donell Beers  - NPO, IVF, NGT to [facility] on 4/16                  Action/Plan: PTA pt lived at home with wife- CM will follow progression post op for d/c needs and plan-- anticipate return home with wife  Expected Discharge Date:            Expected Discharge Plan:  Home/Self Care  In-House Referral:     Discharge planning Services  CM Consult  Post Acute Care Choice:    Choice offered to:     DME Arranged:    DME Agency:     HH Arranged:    HH Agency:     Status of Service:  In process, will continue to follow  If discussed at Long Length of Stay Meetings, dates discussed:    Additional Comments:  Darrold Span, RN 07/25/2016, 10:34 AM

## 2016-07-25 NOTE — Progress Notes (Signed)
Transporting patient to 6N A&Ox4 in stable condition via bed; CCMD informed.

## 2016-07-25 NOTE — Progress Notes (Signed)
[facility] Progress Note  2 Days Post-Op  Subjective: CC: perforated pyloric ulcer Pain controlled - reports abdominal soreness with coughing or movement. Reports very small amounts of flatus last night and one episode this AM. Denies fever, chills, nausea, or vomiting. Ambulated in the hallway yesterday.  Hypertensive this AM - improved s/p IV Medication Objective: Vital signs in last 24 hours: Temp:  [97.7 F (36.5 C)-98.3 F (36.8 C)] 97.9 F (36.6 C) (04/16 0804) Pulse Rate:  [54-112] 68 (04/16 0804) Resp:  [10-24] 14 (04/16 0804) BP: (112-179)/(68-112) 179/102 (04/16 0804) SpO2:  [92 %-100 %] 100 % (04/16 0804) Last BM Date: 07/21/16  Intake/Output from previous day: 04/15 0701 - 04/16 0700 In: 3036.2 [I.V.:2426.2; NG/GT:60; IV Piggyback:550] Out: 1225 [Urine:1150; Emesis/NG output:75] Intake/Output this shift: Total I/O In: -  Out: 50 [Urine:50]  PE: Gen:  Alert, NAD, pleasant Card:  Regular rate and rhythm Pulm:  Normal respiratory effort, clear to auscultation bilaterally Abd: Soft, appropriately tender, mild distention, bowel sounds present, incisions C/D/I             NGT: 75cc/24h Ext:  No erythema, edema, or tenderness   Lab Results:   Recent Labs  07/24/16 0211 07/25/16 0205  WBC 12.6* 9.2  HGB 11.1* 11.3*  HCT 34.0* 34.8*  PLT 193 209   BMET  Recent Labs  07/24/16 0211 07/25/16 0205  NA 135 137  K 4.1 3.8  CL 105 103  CO2 24 28  GLUCOSE 144* 132*  BUN 14 11  CREATININE 1.32* 1.20  CALCIUM 8.0* 8.5*   PT/INR No results for input(s): LABPROT, INR in the last 72 hours. CMP     Component Value Date/Time   NA 137 07/25/2016 0205   K 3.8 07/25/2016 0205   CL 103 07/25/2016 0205   CO2 28 07/25/2016 0205   GLUCOSE 132 (H) 07/25/2016 0205   BUN 11 07/25/2016 0205   CREATININE 1.20 07/25/2016 0205   CALCIUM 8.5 (L) 07/25/2016 0205   PROT 6.9 07/22/2016 2350   ALBUMIN 3.7 07/22/2016 2350   AST 25 07/22/2016 2350   ALT 21  07/22/2016 2350   ALKPHOS 67 07/22/2016 2350   BILITOT 0.8 07/22/2016 2350   GFRNONAA >60 07/25/2016 0205   GFRAA >60 07/25/2016 0205   Lipase     Component Value Date/Time   LIPASE 67 (H) 07/22/2016 2350       Studies/Results: No results found.  Anti-infectives: Anti-infectives    Start     Dose/Rate Route Frequency Ordered Stop   07/23/16 1500  piperacillin-tazobactam (ZOSYN) IVPB 3.375 g     3.375 g 12.5 mL/hr over 240 Minutes Intravenous Every 8 hours 07/23/16 1149     07/23/16 1500  vancomycin (VANCOCIN) IVPB 750 mg/150 ml premix     750 mg 150 mL/hr over 60 Minutes Intravenous Every 12 hours 07/23/16 1335     07/23/16 0715  piperacillin-tazobactam (ZOSYN) IVPB 3.375 g  Status:  Discontinued     3.375 g 12.5 mL/hr over 240 Minutes Intravenous Every 8 hours 07/23/16 0708 07/23/16 1149   07/23/16 0500  vancomycin (VANCOCIN) IVPB 1000 mg/200 mL premix     1,000 mg 200 mL/hr over 60 Minutes Intravenous To Short Stay 07/23/16 0429 07/24/16 0500   07/23/16 0500  fluconazole (DIFLUCAN) IVPB 200 mg     200 mg 100 mL/hr over 60 Minutes Intravenous Daily 07/23/16 0429     07/23/16 0130  piperacillin-tazobactam (ZOSYN) IVPB 3.375 g     3.375 g  100 mL/hr over 30 Minutes Intravenous  Once 07/23/16 0123 07/23/16 1610     Assessment/Plan Perforated pyloric ulcer S/P Exploratory laparotomy, graham patch repair of perforated pyloric channel ulcer, liver biopsy, 07/23/16 Dr. Donell Beers  - NPO, IVF, NGT to LIWS  - PPI therapy  - pain control - IS/Pulm toilet   ABL anemia: hgb 11.1, repeat CBC in AM HTN: IV lopressor, hydralazine; resume home meds when tolerating PO  AKI: Creatinine 1.32 from 1.71; continue IVF and repeat BMET in AM EtOH use: CIWA FEN: NPO, IVF, electrolytes stable  ID: Vanc 4/14>>, Zosyn 4/14 >>, fluconazole 4/14 >> VTE: SCD's, Lovenox Pain: Reduced dose Dilaudid PCA, Robaxin PRN Dispo: transfer to the floor today, mobilize  UGI tomorrow    LOS: 2 days     Adam Phenix , [facility] Surgery 07/25/2016, 8:43 AM Pager: [phone] Consults: [phone] Mon-Fri 7:00 am-4:30 pm Sat-Sun 7:00 am-11:30 am

## 2016-07-26 ENCOUNTER — Inpatient Hospital Stay (HOSPITAL_COMMUNITY): Payer: Medicare Other

## 2016-07-26 LAB — CBC
HCT: 31.8 % — ABNORMAL LOW (ref 39.0–52.0)
Hemoglobin: 10.5 g/dL — ABNORMAL LOW (ref 13.0–17.0)
MCH: 30 pg (ref 26.0–34.0)
MCHC: 33 g/dL (ref 30.0–36.0)
MCV: 90.9 fL (ref 78.0–100.0)
Platelets: 203 10*3/uL (ref 150–400)
RBC: 3.5 MIL/uL — ABNORMAL LOW (ref 4.22–5.81)
RDW: 13.7 % (ref 11.5–15.5)
WBC: 6.7 10*3/uL (ref 4.0–10.5)

## 2016-07-26 LAB — BASIC METABOLIC PANEL
Anion gap: 7 (ref 5–15)
BUN: 10 mg/dL (ref 6–20)
CALCIUM: 8.4 mg/dL — AB (ref 8.9–10.3)
CO2: 28 mmol/L (ref 22–32)
CREATININE: 1.1 mg/dL (ref 0.61–1.24)
Chloride: 103 mmol/L (ref 101–111)
GFR calc non Af Amer: >60 mL/min (ref >60)
Glucose, Bld: 128 mg/dL — ABNORMAL HIGH (ref 65–99)
Potassium: 3.4 mmol/L — ABNORMAL LOW (ref 3.5–5.1)
Sodium: 138 mmol/L (ref 135–145)

## 2016-07-26 LAB — VANCOMYCIN, TROUGH: VANCOMYCIN TR: 14 ug/mL — AB (ref 15–20)

## 2016-07-26 MED ORDER — IOPAMIDOL (ISOVUE-300) INJECTION 61%
INTRAVENOUS | Status: AC
  Administered 2016-07-26: 300 mL
  Filled 2016-07-26: qty 300

## 2016-07-26 MED ORDER — VANCOMYCIN HCL IN DEXTROSE 1-5 GM/200ML-% IV SOLN
1000.0000 mg | Freq: Two times a day (BID) | INTRAVENOUS | Status: DC
  Administered 2016-07-26 – 2016-07-29 (×7): 1000 mg via INTRAVENOUS
  Filled 2016-07-26 (×8): qty 200

## 2016-07-26 MED ORDER — IOPAMIDOL (ISOVUE-300) INJECTION 61%: 300.0000 mL | Freq: Once | INTRAVENOUS | Status: DC | PRN

## 2016-07-26 NOTE — Consult Note (Signed)
[facility] CM Primary Care Navigator  07/26/2016  Raymond Graham [birth year] [id number]   Met with patient at the bedside to identify possible discharge needs. Patient reports having persistent abdominal pain, bloating and constipation thathad led to this admission. Patient endorses Dr. Antonietta Jewel with [facility] ([place]) as the primary care provider and confirms that he no longer sees Dr. Glendale Chard ([facility]) for 2 years.   Patient shared using [facility] to obtain medications without any problem.  Patient states that he manages his own medications at home straight out of the containers.   He reports being able to drive prior to admission but wife Raymond Graham) will  provide transportation to his doctors'appointments after discharge.  Patient's wife will be the primary caregiver at home as stated.  Anticipated discharge plan is home according to patient.  Patient voiced understanding of need for a post discharge follow-up appointment with primary care provider within a week or sooner if needs arise after discharge.Patient letter was provided as areminder.  Patient deniesany other needs or concerns at this time.   For questions, please contact:  Dannielle Huh, BSN, RN- [facility] Primary Care Navigator  Telephone: [phone] [place]

## 2016-07-26 NOTE — Progress Notes (Signed)
Pharmacy Antibiotic Note Raymond Graham is a 66 y.o. male admitted on 07/22/2016 with peritonitis. He is s/p laparotomy and was found to have perforated pyloric channel ulcer, now s/p ex lap with patch repair + s/p liver biopsy. On day #4 of zosyn, vancomycin, fluconazole. WBC 6.7.  A vancomycin trough drawn this afternoon is just below goal at /mL. SCr has improved from 1.7 to 1.1.   Plan: Increase Vancomycin to  IV q12h Zosyn 3.375 g IV q8h Fluconazole 200 mg IV q24h Monitor renal function, clinical progress, repeat VT as indicated   Height:  (172.7 cm) Weight: 213 lb 13.5 oz (97 kg) IBW/kg (Calculated) : 68.4  Temp (24hrs), Avg:98.4 F (36.9 C), Min:98.1 F (36.7 C), Max:99.1 F (37.3 C)   Recent Labs Lab 07/22/16 2350 07/23/16 0010 07/24/16 0211 07/25/16 0205 07/26/16 0304 07/26/16 1542  WBC 10.4  --  12.6* 9.2 6.7  --   CREATININE 1.71*  --  1.32* 1.20 1.10  --   LATICACIDVEN  --  1.19  --   --   --   --   VANCOTROUGH  --   --   --   --   --  14*     Antimicrobials this admission: 4/14 zosyn >>  4/14 vanc >>  4/14 fluconazole >>  Dose Changes: 4/17 VT = 14 on  IV q12h> increased to 1g IV q12h  Microbiology results:  4/14 MRSA PCR: neg   Thank you for allowing pharmacy to be a part of this patient's care.  Rudy Luhmann D. Royale Swamy, PharmD, BCPS Clinical Pharmacist Pager: [phone] 07/26/2016 4:37 PM

## 2016-07-26 NOTE — Progress Notes (Signed)
Patient's NG out approximately 12 inches. Dr. Dwain Sarna notified. NG reinserted halfway -approximately 6 inches per order. Patient tolerated procedure well. Also notified Dr. Dwain Sarna that NG had 100 cc's of clear red drainage that looks like Chloraseptic spray.

## 2016-07-26 NOTE — Progress Notes (Signed)
Pharmacy Antibiotic Note Raymond Graham is a 66 y.o. male admitted on 07/22/2016 with peritonitis. He is s/p laparotomy and was found to have perforated pyloric channel ulcer, now s/p ex lap with patch repair + s/p liver biopsy. On day #4 of zosyn, vancomycin, fluconazole. WBC 6.7, SCr 1.7 >1.1.   Plan: Vancomycin 750 mg IV q12h Zosyn 3.375 g IV q8h Fluconazole 200 mg IV q24h Monitor renal function, clinical progress, VT as indicated   Height:  (172.7 cm) Weight: 213 lb 13.5 oz (97 kg) IBW/kg (Calculated) : 68.4  Temp (24hrs), Avg:98.3 F (36.8 C), Min:97.7 F (36.5 C), Max:99.1 F (37.3 C)   Recent Labs Lab 07/22/16 2350 07/23/16 0010 07/24/16 0211 07/25/16 0205 07/26/16 0304  WBC 10.4  --  12.6* 9.2 6.7  CREATININE 1.71*  --  1.32* 1.20 1.10  LATICACIDVEN  --  1.19  --   --   --      Antimicrobials this admission: 4/14 zosyn >>  4/14 vanc >>  4/14 fluconazole >>   Microbiology results:  4/14 MRSA PCR: neg   Thank you for allowing pharmacy to be a part of this patient's care.    Agapito Games, PharmD, BCPS Clinical Pharmacist 07/26/2016 11:39 AM

## 2016-07-26 NOTE — Progress Notes (Signed)
[facility] Progress Note  3 Days Post-Op  Subjective: CC: postop abdominal pain  Pt states his pain is a 7/10 and is controlled with the PCA. He is having flatus. No fever, or chills. Had some nausea overnight that was relieved with medication. Pt is waiting to go for his UGI.   Objective: Vital signs in last 24 hours: Temp:  [97.7 F (36.5 C)-99.1 F (37.3 C)] 98.1 F (36.7 C) (04/17 0520) Pulse Rate:  [62-68] 64 (04/17 0520) Resp:  [13-19] 16 (04/17 0520) BP: (137-179)/(85-102) 159/93 (04/17 0520) SpO2:  [93 %-100 %] 96 % (04/17 0520) FiO2 (%):  [0 %] 0 % (04/16 1914) Weight:  [213 lb 13.5 oz (97 kg)] 213 lb 13.5 oz (97 kg) (04/16 2109) Last BM Date: 07/21/16  Intake/Output from previous day: 04/16 0701 - 04/17 0700 In: 2489.6 [I.V.:2089.6; IV Piggyback:400] Out: 1525 [Urine:875; Emesis/NG output:650] Intake/Output this shift: No intake/output data recorded.  PE: Gen:  Alert, NAD, pleasant, cooperative, well appearing Card:  RRR, no M/G/R heard Pulm:  CTA, no W/R/R, effort normal Abd: Soft, not distended, +BS, incisions C/D/I, mild TTP more around incision Skin: no rashes noted, warm and dry  Lab Results:   Recent Labs  07/25/16 0205 07/26/16 0304  WBC 9.2 6.7  HGB 11.3* 10.5*  HCT 34.8* 31.8*  PLT 209 203   BMET  Recent Labs  07/25/16 0205 07/26/16 0304  NA 137 138  K 3.8 3.4*  CL 103 103  CO2 28 28  GLUCOSE 132* 128*  BUN 11 10  CREATININE 1.20 1.10  CALCIUM 8.5* 8.4*   PT/INR No results for input(s): LABPROT, INR in the last 72 hours. CMP     Component Value Date/Time   NA 138 07/26/2016 0304   K 3.4 (L) 07/26/2016 0304   CL 103 07/26/2016 0304   CO2 28 07/26/2016 0304   GLUCOSE 128 (H) 07/26/2016 0304   BUN 10 07/26/2016 0304   CREATININE 1.10 07/26/2016 0304   CALCIUM 8.4 (L) 07/26/2016 0304   PROT 6.9 07/22/2016 2350   ALBUMIN 3.7 07/22/2016 2350   AST 25 07/22/2016 2350   ALT 21 07/22/2016 2350   ALKPHOS 67  07/22/2016 2350   BILITOT 0.8 07/22/2016 2350   GFRNONAA >60 07/26/2016 0304   GFRAA >60 07/26/2016 0304   Lipase     Component Value Date/Time   LIPASE 67 (H) 07/22/2016 2350       Studies/Results: No results found.  Anti-infectives: Anti-infectives    Start     Dose/Rate Route Frequency Ordered Stop   07/23/16 1500  piperacillin-tazobactam (ZOSYN) IVPB 3.375 g     3.375 g 12.5 mL/hr over 240 Minutes Intravenous Every 8 hours 07/23/16 1149     07/23/16 1500  vancomycin (VANCOCIN) IVPB 750 mg/150 ml premix     750 mg 150 mL/hr over 60 Minutes Intravenous Every 12 hours 07/23/16 1335     07/23/16 0715  piperacillin-tazobactam (ZOSYN) IVPB 3.375 g  Status:  Discontinued     3.375 g 12.5 mL/hr over 240 Minutes Intravenous Every 8 hours 07/23/16 0708 07/23/16 1149   07/23/16 0500  vancomycin (VANCOCIN) IVPB 1000 mg/200 mL premix     1,000 mg 200 mL/hr over 60 Minutes Intravenous To Short Stay 07/23/16 0429 07/24/16 0500   07/23/16 0500  fluconazole (DIFLUCAN) IVPB 200 mg     200 mg 100 mL/hr over 60 Minutes Intravenous Daily 07/23/16 0429     07/23/16 0130  piperacillin-tazobactam (ZOSYN) IVPB 3.375 g  3.375 g 100 mL/hr over 30 Minutes Intravenous  Once 07/23/16 0123 07/23/16 0981       Assessment/Plan Perforated pyloric ulcer S/P Exploratory laparotomy, graham patch repair of perforated pyloric channel ulcer, liver biopsy, 07/23/16 Dr. Donell Beers  - NPO, IVF, NGT to LIWS  - PPI therapy  - pain control - IS/Pulm toilet  - UGI pending  ABL anemia: hgb 10.5, stable, repeat CBC in AM HTN: IV lopressor, hydralazine; resume home meds when tolerating PO  AKI: resolved, continue IVF  EtOH use: CIWA  FEN: NPO, IVF, electrolytes stable  ID: Vanc 4/14>>, Zosyn 4/14 >>, fluconazole 4/14 >> VTE: SCD's, Lovenox Pain: Reduced dose Dilaudid PCA, Robaxin PRN  Dispo: mobilize, UGI today, + flatus     LOS: 3 days    Jerre Simon , [facility]  Surgery 07/26/2016, 8:00 AM Pager: [phone] Consults: [phone] Mon-Fri 7:00 am-4:30 pm Sat-Sun 7:00 am-11:30 am

## 2016-07-26 NOTE — Progress Notes (Signed)
Was notified by student nurse patient's BP was elevated but not symptomatic. I gave him his scheduled metoprolol and said I would recheck in an hour and if not better would give the prn hydralazine.Patient transported down to Radiology before the hour of recheck, will reassess blood pressure when he returns to unit.

## 2016-07-27 MED ORDER — METOPROLOL SUCCINATE ER 100 MG PO TB24
200.0000 mg | ORAL_TABLET | Freq: Every day | ORAL | Status: DC
  Administered 2016-07-27 – 2016-07-29 (×3): 200 mg via ORAL
  Filled 2016-07-27 (×3): qty 2

## 2016-07-27 MED ORDER — GABAPENTIN 100 MG PO CAPS
100.0000 mg | ORAL_CAPSULE | Freq: Three times a day (TID) | ORAL | Status: DC
  Administered 2016-07-27 – 2016-07-29 (×7): 100 mg via ORAL
  Filled 2016-07-27 (×7): qty 1

## 2016-07-27 MED ORDER — OXYCODONE HCL 5 MG PO TABS
10.0000 mg | ORAL_TABLET | ORAL | Status: DC | PRN
  Administered 2016-07-27 – 2016-07-28 (×4): 10 mg via ORAL
  Filled 2016-07-27 (×4): qty 2

## 2016-07-27 MED ORDER — HYDROCHLOROTHIAZIDE 12.5 MG PO CAPS
12.5000 mg | ORAL_CAPSULE | Freq: Every day | ORAL | Status: DC
  Administered 2016-07-27 – 2016-07-29 (×3): 12.5 mg via ORAL
  Filled 2016-07-27 (×3): qty 1

## 2016-07-27 MED ORDER — LOSARTAN POTASSIUM-HCTZ 50-12.5 MG PO TABS: 1.0000 | ORAL_TABLET | Freq: Every morning | ORAL | Status: DC

## 2016-07-27 MED ORDER — PANTOPRAZOLE SODIUM 40 MG PO TBEC
40.0000 mg | DELAYED_RELEASE_TABLET | Freq: Two times a day (BID) | ORAL | Status: DC
  Administered 2016-07-27 – 2016-07-29 (×5): 40 mg via ORAL
  Filled 2016-07-27 (×5): qty 1

## 2016-07-27 MED ORDER — LOSARTAN POTASSIUM 50 MG PO TABS
50.0000 mg | ORAL_TABLET | Freq: Every day | ORAL | Status: DC
  Administered 2016-07-27 – 2016-07-29 (×3): 50 mg via ORAL
  Filled 2016-07-27 (×3): qty 1

## 2016-07-27 MED ORDER — TAMSULOSIN HCL 0.4 MG PO CAPS
0.4000 mg | ORAL_CAPSULE | Freq: Every day | ORAL | Status: DC
  Administered 2016-07-27 – 2016-07-29 (×3): 0.4 mg via ORAL
  Filled 2016-07-27 (×3): qty 1

## 2016-07-27 MED ORDER — CLONIDINE HCL 0.1 MG PO TABS
0.1000 mg | ORAL_TABLET | Freq: Two times a day (BID) | ORAL | Status: DC
  Administered 2016-07-27 – 2016-07-29 (×5): 0.1 mg via ORAL
  Filled 2016-07-27 (×5): qty 1

## 2016-07-27 MED ORDER — FOLIC ACID 1 MG PO TABS
1.0000 mg | ORAL_TABLET | Freq: Every day | ORAL | Status: DC
  Administered 2016-07-27 – 2016-07-29 (×3): 1 mg via ORAL
  Filled 2016-07-27 (×3): qty 1

## 2016-07-27 MED ORDER — TRAZODONE HCL 150 MG PO TABS
150.0000 mg | ORAL_TABLET | Freq: Every evening | ORAL | Status: DC | PRN
  Administered 2016-07-28 (×2): 150 mg via ORAL
  Filled 2016-07-27 (×2): qty 1

## 2016-07-27 NOTE — Care Management Important Message (Signed)
Important Message  Patient Details  Name: Raymond Graham MRN: [id number] Date of Birth: Sep 12, 1950   Medicare Important Message Given:  Yes    Dorena Bodo 07/27/2016, 12:37 PM

## 2016-07-27 NOTE — Progress Notes (Signed)
[facility] Progress Note 4 Days Post-Op  Subjective: Patient doing well.  Several bowel movements stimulated by oral contrast.  Objective: Vital signs in last 24 hours: Temp:  [97.9 F (36.6 C)-99 F (37.2 C)] 98.4 F (36.9 C) (04/18 0518) Pulse Rate:  [55-64] 63 (04/18 0518) Resp:  [15-18] 17 (04/18 0518) BP: (162-191)/(76-105) 191/95 (04/18 0518) SpO2:  [93 %-100 %] 100 % (04/18 0518) Last BM Date: 07/26/16  Intake/Output from previous day: 04/17 0701 - 04/18 0700 In: 3111.7 [P.O.:120; I.V.:2791.7; IV Piggyback:200] Out: -  Intake/Output this shift: Total I/O In: 2236.7 [P.O.:120; I.V.:1916.7; IV Piggyback:200] Out: -   General: No acute distress  Lungs: Clear  Abd: Good bowel sounds.  Distended, but not tense.  Wound is covered and no surrounding erythema.  Extremities: No clinical signs or symptoms of DVT  Neuro: Intact  Lab Results:  (wbc:2,hgb:2,hct:2,plt:2) BMET ) Recent Labs  07/25/16 0205 07/26/16 0304  NA 137 138  K 3.8 3.4*  CL 103 103  CO2 28 28  GLUCOSE 132* 128*  BUN 11 10  CREATININE 1.20 1.10  CALCIUM 8.5* 8.4*   PT/INR No results for input(s): LABPROT, INR in the last 72 hours. ABG No results for input(s): PHART, HCO3 in the last 72 hours.  Invalid input(s): PCO2, PO2  Studies/Results: Dg Ugi W/water Sol Cm  Result Date: 07/26/2016 CLINICAL DATA:  3 days postop from [facility] patch repair of perforated pyloric ulcer. Evaluate for postop leak. EXAM: WATER SOLUBLE UPPER GI SERIES TECHNIQUE: Single-column upper GI series was performed using water soluble Isovue-300 contrast. COMPARISON:  None. FLUOROSCOPY TIME:  Fluoroscopy Time: 2 minutes 12 seconds ; low dose pulsed fluoroscopy Radiation Exposure Index (if provided by the fluoroscopic device): 46.4 mGy Number of Acquired Spot Images: 0 FINDINGS: Scout radiograph shows mild dilatation of small bowel and colon, consistent with mild postop ileus. Midline skin staples noted. Nasogastric  tube is seen with tip in the mid thoracic esophagus. No evidence of esophageal mass or stricture. Small sliding hiatal hernia noted. Gastroesophageal reflux also seen to the level of the mid thoracic esophagus. The stomach is otherwise normal in appearance. No evidence of gastric masses or ulcers. A small ulcer with benign radiographic features is seen arising from the proximal duodenal bulb. There is no evidence of contrast leak or extravasation. Prompt gastric emptying is noted. Remainder the duodenum is normal in appearance. IMPRESSION: Small benign-appearing ulcer rising for the proximal duodenal bulb. No evidence of contrast leak, extravasation, or other complication. Small sliding hiatal hernia with gastroesophageal reflux. Mild postop ileus.  Nasogastric tube tip in mid thoracic esophagus. Electronically Signed   By: Myles Rosenthal M.D.   On: 07/26/2016 14:19    Anti-infectives: Anti-infectives    Start     Dose/Rate Route Frequency Ordered Stop   07/26/16 1700  vancomycin (VANCOCIN) IVPB 1000 mg/200 mL premix     1,000 mg 200 mL/hr over 60 Minutes Intravenous Every 12 hours 07/26/16 1638     07/23/16 1500  piperacillin-tazobactam (ZOSYN) IVPB 3.375 g     3.375 g 12.5 mL/hr over 240 Minutes Intravenous Every 8 hours 07/23/16 1149     07/23/16 1500  vancomycin (VANCOCIN) IVPB 750 mg/150 ml premix  Status:  Discontinued     750 mg 150 mL/hr over 60 Minutes Intravenous Every 12 hours 07/23/16 1335 07/26/16 1638   07/23/16 0715  piperacillin-tazobactam (ZOSYN) IVPB 3.375 g  Status:  Discontinued     3.375 g 12.5 mL/hr over 240 Minutes Intravenous Every 8  hours 07/23/16 0708 07/23/16 1149   07/23/16 0500  vancomycin (VANCOCIN) IVPB 1000 mg/200 mL premix     1,000 mg 200 mL/hr over 60 Minutes Intravenous To Short Stay 07/23/16 0429 07/24/16 0500   07/23/16 0500  fluconazole (DIFLUCAN) IVPB 200 mg     200 mg 100 mL/hr over 60 Minutes Intravenous Daily 07/23/16 0429     07/23/16 0130   piperacillin-tazobactam (ZOSYN) IVPB 3.375 g     3.375 g 100 mL/hr over 30 Minutes Intravenous  Once 07/23/16 0123 07/23/16 1610      Assessment/Plan: s/p Procedure(s): EXPLORATORY LAPAROTOMY WITH GRAHAM PATCH REPAIR OF PYLORIC ULCER Advance diet Decrease IVFs  DC PCA  LOS: 4 days   Marta Lamas. Gae Bon, MD, FACS [phone] [facility] Surgery 07/27/2016

## 2016-07-28 MED ORDER — VITAMIN B-1 100 MG PO TABS
100.0000 mg | ORAL_TABLET | Freq: Every day | ORAL | Status: DC
  Administered 2016-07-29: 100 mg via ORAL
  Filled 2016-07-28: qty 1

## 2016-07-28 MED ORDER — FLUCONAZOLE 100 MG PO TABS
200.0000 mg | ORAL_TABLET | Freq: Every day | ORAL | Status: DC
  Administered 2016-07-29: 200 mg via ORAL
  Filled 2016-07-28: qty 2

## 2016-07-28 NOTE — Care Management Note (Signed)
Case Management Note  Patient Details  Name: Raymond Graham MRN: [id number] Date of Birth: 1950-06-03  Subjective/Objective:                    Action/Plan:   Expected Discharge Date:  07/23/16               Expected Discharge Plan:  Home/Self Care  In-House Referral:     Discharge planning Services  CM Consult  Post Acute Care Choice:    Choice offered to:     DME Arranged:    DME Agency:     HH Arranged:    HH Agency:     Status of Service:  In process, will continue to follow  If discussed at Long Length of Stay Meetings, dates discussed:    Additional Comments:  Kingsley Plan, RN 07/28/2016, 2:55 PM

## 2016-07-28 NOTE — Progress Notes (Signed)
5 Days Post-Op  Subjective: CC: complains of bloating  Objective: Vital signs in last 24 hours: Temp:  [98 F (36.7 C)-98.2 F (36.8 C)] 98.2 F (36.8 C) (04/19 0515) Pulse Rate:  [52-65] 55 (04/19 0515) Resp:  [17-18] 17 (04/19 0515) BP: (146-176)/(83-95) 160/85 (04/19 0515) SpO2:  [97 %-100 %] 97 % (04/19 0515) Last BM Date: 07/27/16  Intake/Output from previous day: 04/18 0701 - 04/19 0700 In: 1114.6 [P.O.:120; I.V.:944.6; IV Piggyback:50] Out: -  Intake/Output this shift: No intake/output data recorded.  General appearance: alert and cooperative Resp: clear to auscultation bilaterally Cardio: regular rate and rhythm GI: soft, minimal tenderness. incision looks good. good bs  Lab Results:   Recent Labs  07/26/16 0304  WBC 6.7  HGB 10.5*  HCT 31.8*  PLT 203   BMET  Recent Labs  07/26/16 0304  NA 138  K 3.4*  CL 103  CO2 28  GLUCOSE 128*  BUN 10  CREATININE 1.10  CALCIUM 8.4*   PT/INR No results for input(s): LABPROT, INR in the last 72 hours. ABG No results for input(s): PHART, HCO3 in the last 72 hours.  Invalid input(s): PCO2, PO2  Studies/Results: Dg Ugi W/water Sol Cm  Result Date: 07/26/2016 CLINICAL DATA:  3 days postop from [facility] patch repair of perforated pyloric ulcer. Evaluate for postop leak. EXAM: WATER SOLUBLE UPPER GI SERIES TECHNIQUE: Single-column upper GI series was performed using water soluble Isovue-300 contrast. COMPARISON:  None. FLUOROSCOPY TIME:  Fluoroscopy Time: 2 minutes 12 seconds ; low dose pulsed fluoroscopy Radiation Exposure Index (if provided by the fluoroscopic device): 46.4 mGy Number of Acquired Spot Images: 0 FINDINGS: Scout radiograph shows mild dilatation of small bowel and colon, consistent with mild postop ileus. Midline skin staples noted. Nasogastric tube is seen with tip in the mid thoracic esophagus. No evidence of esophageal mass or stricture. Small sliding hiatal hernia noted. Gastroesophageal reflux also  seen to the level of the mid thoracic esophagus. The stomach is otherwise normal in appearance. No evidence of gastric masses or ulcers. A small ulcer with benign radiographic features is seen arising from the proximal duodenal bulb. There is no evidence of contrast leak or extravasation. Prompt gastric emptying is noted. Remainder the duodenum is normal in appearance. IMPRESSION: Small benign-appearing ulcer rising for the proximal duodenal bulb. No evidence of contrast leak, extravasation, or other complication. Small sliding hiatal hernia with gastroesophageal reflux. Mild postop ileus.  Nasogastric tube tip in mid thoracic esophagus. Electronically Signed   By: Myles Rosenthal M.D.   On: 07/26/2016 14:19    Anti-infectives: Anti-infectives    Start     Dose/Rate Route Frequency Ordered Stop   07/26/16 1700  vancomycin (VANCOCIN) IVPB 1000 mg/200 mL premix     1,000 mg 200 mL/hr over 60 Minutes Intravenous Every 12 hours 07/26/16 1638     07/23/16 1500  piperacillin-tazobactam (ZOSYN) IVPB 3.375 g     3.375 g 12.5 mL/hr over 240 Minutes Intravenous Every 8 hours 07/23/16 1149     07/23/16 1500  vancomycin (VANCOCIN) IVPB 750 mg/150 ml premix  Status:  Discontinued     750 mg 150 mL/hr over 60 Minutes Intravenous Every 12 hours 07/23/16 1335 07/26/16 1638   07/23/16 0715  piperacillin-tazobactam (ZOSYN) IVPB 3.375 g  Status:  Discontinued     3.375 g 12.5 mL/hr over 240 Minutes Intravenous Every 8 hours 07/23/16 0708 07/23/16 1149   07/23/16 0500  vancomycin (VANCOCIN) IVPB 1000 mg/200 mL premix  1,000 mg 200 mL/hr over 60 Minutes Intravenous To Short Stay 07/23/16 0429 07/24/16 0500   07/23/16 0500  fluconazole (DIFLUCAN) IVPB 200 mg     200 mg 100 mL/hr over 60 Minutes Intravenous Daily 07/23/16 0429     07/23/16 0130  piperacillin-tazobactam (ZOSYN) IVPB 3.375 g     3.375 g 100 mL/hr over 30 Minutes Intravenous  Once 07/23/16 0123 07/23/16 6295      Assessment/Plan: s/p  Procedure(s): EXPLORATORY LAPAROTOMY WITH GRAHAM PATCH REPAIR OF PYLORIC ULCER (N/A) Advance diet. Start regular diet Continue abx Hopefully ready for discharge tomorrow  LOS: 5 days    TOTH III,Keylon Labelle S 07/28/2016

## 2016-07-29 MED ORDER — PANTOPRAZOLE SODIUM 40 MG PO TBEC: 40.0000 mg | DELAYED_RELEASE_TABLET | Freq: Two times a day (BID) | ORAL | 2 refills | Status: DC

## 2016-07-29 MED ORDER — OXYCODONE HCL 10 MG PO TABS: 5.0000 mg | ORAL_TABLET | ORAL | 0 refills | Status: DC | PRN

## 2016-07-29 MED ORDER — METHOCARBAMOL 500 MG PO TABS: 500.0000 mg | ORAL_TABLET | Freq: Four times a day (QID) | ORAL | 0 refills | Status: DC | PRN

## 2016-07-29 NOTE — Discharge Summary (Signed)
[facility] Discharge Summary   Patient ID: Raymond Graham MRN: [id number] DOB/AGE: 68-Jul-1952 68 y.o.  Admit date: 07/22/2016 Discharge date: 07/29/2016  Admitting Diagnosis: pneumoperitoneum  Constipation Perforated chronic peptic ulcer  Discharge Diagnosis Patient Active Problem List   Diagnosis Date Noted  . Perforated chronic peptic ulcer (HCC) 07/23/2016  . 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 Pneumoperitoneum is confirmed likely from an upper abdominal source possibly from the right upper quadrant given the soft tissue induration seen as well as small amount of free fluid outlining the liver and to a lesser degree the spleen. No extravasation of the ingested enteric contrast is noted and findings may be due to a sealed perforation. 2. Distal descending and sigmoid diverticulosis without acute diverticulitis. Given the lack of free air surrounding a segment of bowel, it is less likely that a perforated diverticulum from this location is the etiology.   Procedures Dr. Donell Beers (07/23/16) - Exploratory laparotomy, graham patch repair of perforated pyloric channel ulcer, liver biopsy  Hospital Course:  Raymond Graham is a 66 year old male who presented to [facility] with progressive abdominal discomfort, bloating with constipation for 3 weeks.  Workup showed pneumoperitoneum. Patient was admitted and underwent procedure listed above. NG tube was placed. Tolerated procedure well and was transferred to the ICU. Pt was placed on CIWA protocol. He had an AKI which resolved during his hospital stay. On POD#2 pt was transferred to the floor. On POD#3 pt had an UGI which showed no leak. NG tube was removed and diet was advanced as tolerated. On POD#6, the patient was voiding well, tolerating diet, ambulating well, pain well controlled, vital signs stable, incisions c/d/i and felt stable for  discharge home.  Patient will follow up in our office in 2 weeks and knows to call with questions or concerns.  He* will call to confirm appointment date/time.    Patient was discharged in good condition.  The [place] Substance controlled database was reviewed prior to prescribing narcotic pain medication to this patient.  Physical Exam: General:  Alert, NAD, pleasant, cooperative, well appearing Cardio: RRR, S1 & S2 normal, no murmur, rubs, gallops Resp: Effort normal, lungs CTA bilaterally, no wheezes, rales, rhonchi Abd:  Soft, ND, mild TTP, midline incision with staples, no surrouding erythema, edema or drainage Skin: warm and dry   Allergies as of 07/29/2016   No Known Allergies     Medication List    STOP taking these medications   cyclobenzaprine 10 MG tablet Commonly known as:  FLEXERIL     TAKE these medications   cloNIDine 0.1 MG tablet Commonly known as:  CATAPRES Take 0.1 mg by mouth 2 (two) times daily.   gabapentin 100 MG capsule Commonly known as:  NEURONTIN Take 100 mg by mouth 3 (three) times daily.   losartan-hydrochlorothiazide 50-12.5 MG tablet Commonly known as:  HYZAAR Take 1 tablet by mouth every morning.   methocarbamol 500 MG tablet Commonly known as:  ROBAXIN Take 1 tablet (500 mg total) by mouth every 6 (six) hours as needed for muscle spasms.   metoprolol succinate 100 MG 24 hr tablet Commonly known as:  TOPROL-XL Take 200 mg by mouth daily after breakfast. Take with or immediately following a meal.   mometasone 50 MCG/ACT nasal spray Commonly known as:  NASONEX Place 2 sprays into the nose daily as needed. For congestion   multivitamin with  minerals Tabs tablet Take 1 tablet by mouth daily.   Oxycodone HCl 10 MG Tabs Take 0.5 tablets (5 mg total) by mouth every 4 (four) hours as needed for moderate pain.   pantoprazole 40 MG tablet Commonly known as:  PROTONIX Take 1 tablet (40 mg total) by mouth 2 (two) times daily.    tamsulosin 0.4 MG Caps capsule Commonly known as:  FLOMAX Take 0.4 mg by mouth daily after breakfast.   traMADol 50 MG tablet Commonly known as:  ULTRAM Take 1 tablet (50 mg total) by mouth every 8 (eight) hours as needed.   traZODone 150 MG tablet Commonly known as:  DESYREL Take 150 mg by mouth at bedtime as needed for sleep.   TURMERIC PO Take 1 tablet by mouth daily.   Vitamin D 2000 units Caps Take 4,000 Units by mouth daily.       Follow-up Information    BYERLY,FAERA, MD. Go on 08/05/2016.   Specialty:  General Surgery Why:  4/27 10:00AM for staple removel Call our office to confirm your appointment date and time for your follow up appointment with Dr. Bobbye Charleston information: [address] [place] [address] [phone]            Signed: Joyce Graham [facility] 07/29/2016, 8:57 AM Pager: [phone] Consults: [phone] Mon-Fri 7:00 am-4:30 pm Sat-Sun 7:00 am-11:30 am

## 2016-07-29 NOTE — Discharge Instructions (Signed)
[facility]      [facility], [place] [phone]  OPEN ABDOMINAL SURGERY: POST OP INSTRUCTIONS  Always review your discharge instruction sheet given to you by the facility where your surgery was performed.  IF YOU HAVE DISABILITY OR FAMILY LEAVE FORMS, YOU MUST BRING THEM TO THE OFFICE FOR PROCESSING.  PLEASE DO NOT GIVE THEM TO YOUR DOCTOR.  1. A prescription for pain medication may be given to you upon discharge.  Take your pain medication as prescribed, if needed.  If narcotic pain medicine is not needed, then you may take acetaminophen (Tylenol) or ibuprofen (Advil) as needed. 2. Take your usually prescribed medications unless otherwise directed. 3. If you need a refill on your pain medication, please contact your pharmacy. They will contact our office to request authorization.  Prescriptions will not be filled after 5pm or on week-ends. 4. You should follow a light diet the first few days after arrival home, such as soup and crackers, pudding, etc.unless your doctor has advised otherwise. A high-fiber, low fat diet can be resumed as tolerated.   Be sure to include lots of fluids daily. Most patients will experience some swelling and bruising on the chest and neck area.  Ice packs will help.  Swelling and bruising can take several days to resolve 5. Most patients will experience some swelling and bruising in the area of the incision. Ice pack will help. Swelling and bruising can take several days to resolve..  6. It is common to experience some constipation if taking pain medication after surgery.  Increasing fluid intake and taking a stool softener will usually help or prevent this problem from occurring.  A mild laxative (Milk of Magnesia or Miralax) should be taken according to package directions if there are no bowel movements after 48 hours. 7.  You may have steri-strips (small skin tapes) in place directly over the incision.  These strips should be left on the skin for 7-10 days.  If your  surgeon used skin glue on the incision, you may shower in 24 hours.  The glue will flake off over the next 2-3 weeks.  Any sutures or staples will be removed at the office during your follow-up visit. You may find that a light gauze bandage over your incision may keep your staples from being rubbed or pulled. You may shower and replace the bandage daily. 8. ACTIVITIES:  You may resume regular (light) daily activities beginning the next day--such as daily self-care, walking, climbing stairs--gradually increasing activities as tolerated.  You may have sexual intercourse when it is comfortable.  Refrain from any heavy lifting or straining until approved by your doctor. a. You may drive when you no longer are taking prescription pain medication, you can comfortably wear a seatbelt, and you can safely maneuver your car and apply brakes b. Return to Work: after  Your follow up appointment 9. You should see your doctor in the office for a follow-up appointment approximately two weeks after your surgery.  Make sure that you call for this appointment within a day or two after you arrive home to insure a convenient appointment time. OTHER INSTRUCTIONS:  _____________________________________________________________ _____________________________________________________________  WHEN TO CALL YOUR DOCTOR: 1. Fever over 101.0 2. Inability to urinate 3. Nausea and/or vomiting 4. Extreme swelling or bruising 5. Continued bleeding from incision. 6. Increased pain, redness, or drainage from the incision. 7. Difficulty swallowing or breathing 8. Muscle cramping or spasms. 9. Numbness or tingling in hands or feet or around lips.  The  clinic staff is available to answer your questions during regular business hours.  Please dont hesitate to call and ask to speak to one of the nurses if you have concerns.  For further questions, please visit [email or web address]

## 2016-07-29 NOTE — Care Management Important Message (Signed)
Important Message  Patient Details  Name: Raymond Graham MRN: [id number] Date of Birth: July 04, 1950   Medicare Important Message Given:  Yes    Dorena Bodo 07/29/2016, 3:14 PM

## 2016-07-29 NOTE — Progress Notes (Signed)
Pharmacy Antibiotic Note DONNAVAN COVAULT is a 66 y.o. male admitted on 07/22/2016 with peritonitis. He is s/p laparotomy and was found to have perforated pyloric channel ulcer, now s/p ex lap with patch repair and s/p liver biopsy.   Patient's renal function has been stable.  He is afebrile and his WBC is WNL.   Plan: - Vanc 1gm IV Q12H - Zosyn 3.375gm IV Q8H, 4 hr infusion - Fluc  IV Q24H - Monitor renal fxn, clinical progress, repeat vanc trough if not discharged   Height:  (172.7 cm) Weight: 213 lb 13.5 oz (97 kg) IBW/kg (Calculated) : 68.4  Temp (24hrs), Avg:98.1 F (36.7 C), Min:97.8 F (36.6 C), Max:98.4 F (36.9 C)   Recent Labs Lab 07/22/16 2350 07/23/16 0010 07/24/16 0211 07/25/16 0205 07/26/16 0304 07/26/16 1542  WBC 10.4  --  12.6* 9.2 6.7  --   CREATININE 1.71*  --  1.32* 1.20 1.10  --   LATICACIDVEN  --  1.19  --   --   --   --   VANCOTROUGH  --   --   --   --   --  14*      Vanc 4/14 >> Zosyn 4/14 >> Fluc 4/14 >>  4/17 VT = 14 mcg/mL on  q12 >> 1gm q12  4/14 MRSA PCR - negative   Toi Stelly D. Laney Potash, PharmD, BCPS Pager:  [phone] 07/29/2016, 12:55 PM

## 2016-07-29 NOTE — Progress Notes (Signed)
Discharge home. Home discharge instruction given, no question verbalized. 

## 2016-08-29 DIAGNOSIS — H578 Other specified disorders of eye and adnexa: Secondary | ICD-10-CM | POA: Diagnosis not present

## 2016-09-12 DIAGNOSIS — M25512 Pain in left shoulder: Secondary | ICD-10-CM | POA: Diagnosis not present

## 2016-10-03 DIAGNOSIS — I10 Essential (primary) hypertension: Secondary | ICD-10-CM | POA: Diagnosis not present

## 2016-10-03 DIAGNOSIS — M25512 Pain in left shoulder: Secondary | ICD-10-CM | POA: Diagnosis not present

## 2016-10-03 DIAGNOSIS — Z79899 Other long term (current) drug therapy: Secondary | ICD-10-CM | POA: Diagnosis not present

## 2016-10-03 DIAGNOSIS — R5383 Other fatigue: Secondary | ICD-10-CM | POA: Diagnosis not present

## 2016-10-06 DIAGNOSIS — Z79899 Other long term (current) drug therapy: Secondary | ICD-10-CM | POA: Diagnosis not present

## 2016-10-06 DIAGNOSIS — E7800 Pure hypercholesterolemia, unspecified: Secondary | ICD-10-CM | POA: Diagnosis not present

## 2016-10-06 DIAGNOSIS — R739 Hyperglycemia, unspecified: Secondary | ICD-10-CM | POA: Diagnosis not present

## 2016-10-28 DIAGNOSIS — M19012 Primary osteoarthritis, left shoulder: Secondary | ICD-10-CM | POA: Diagnosis not present

## 2016-10-28 DIAGNOSIS — M1711 Unilateral primary osteoarthritis, right knee: Secondary | ICD-10-CM | POA: Diagnosis not present

## 2016-11-01 DIAGNOSIS — I10 Essential (primary) hypertension: Secondary | ICD-10-CM | POA: Diagnosis not present

## 2016-11-01 DIAGNOSIS — E7800 Pure hypercholesterolemia, unspecified: Secondary | ICD-10-CM | POA: Diagnosis not present

## 2016-11-01 DIAGNOSIS — Z79899 Other long term (current) drug therapy: Secondary | ICD-10-CM | POA: Diagnosis not present

## 2016-11-23 DIAGNOSIS — E7800 Pure hypercholesterolemia, unspecified: Secondary | ICD-10-CM | POA: Diagnosis not present

## 2016-11-23 DIAGNOSIS — Z79899 Other long term (current) drug therapy: Secondary | ICD-10-CM | POA: Diagnosis not present

## 2016-11-23 DIAGNOSIS — I10 Essential (primary) hypertension: Secondary | ICD-10-CM | POA: Diagnosis not present

## 2016-11-23 DIAGNOSIS — M79609 Pain in unspecified limb: Secondary | ICD-10-CM | POA: Diagnosis not present

## 2016-12-07 DIAGNOSIS — M1711 Unilateral primary osteoarthritis, right knee: Secondary | ICD-10-CM | POA: Diagnosis not present

## 2016-12-14 DIAGNOSIS — M1711 Unilateral primary osteoarthritis, right knee: Secondary | ICD-10-CM | POA: Diagnosis not present

## 2016-12-14 DIAGNOSIS — M19012 Primary osteoarthritis, left shoulder: Secondary | ICD-10-CM | POA: Diagnosis not present

## 2016-12-21 DIAGNOSIS — M1711 Unilateral primary osteoarthritis, right knee: Secondary | ICD-10-CM | POA: Diagnosis not present

## 2016-12-21 DIAGNOSIS — M19012 Primary osteoarthritis, left shoulder: Secondary | ICD-10-CM | POA: Diagnosis not present

## 2016-12-22 DIAGNOSIS — I10 Essential (primary) hypertension: Secondary | ICD-10-CM | POA: Diagnosis not present

## 2016-12-22 DIAGNOSIS — Z79899 Other long term (current) drug therapy: Secondary | ICD-10-CM | POA: Diagnosis not present

## 2016-12-22 DIAGNOSIS — E7800 Pure hypercholesterolemia, unspecified: Secondary | ICD-10-CM | POA: Diagnosis not present

## 2016-12-28 DIAGNOSIS — M19012 Primary osteoarthritis, left shoulder: Secondary | ICD-10-CM | POA: Diagnosis not present

## 2017-01-02 DIAGNOSIS — N401 Enlarged prostate with lower urinary tract symptoms: Secondary | ICD-10-CM | POA: Diagnosis not present

## 2017-01-09 DIAGNOSIS — N411 Chronic prostatitis: Secondary | ICD-10-CM | POA: Diagnosis not present

## 2017-01-09 DIAGNOSIS — N401 Enlarged prostate with lower urinary tract symptoms: Secondary | ICD-10-CM | POA: Diagnosis not present

## 2017-01-11 ENCOUNTER — Other Ambulatory Visit: Payer: Self-pay | Admitting: Orthopaedic Surgery

## 2017-01-11 DIAGNOSIS — M1711 Unilateral primary osteoarthritis, right knee: Secondary | ICD-10-CM | POA: Diagnosis not present

## 2017-01-20 DIAGNOSIS — M25561 Pain in right knee: Secondary | ICD-10-CM | POA: Diagnosis not present

## 2017-01-20 DIAGNOSIS — I10 Essential (primary) hypertension: Secondary | ICD-10-CM | POA: Diagnosis not present

## 2017-01-20 DIAGNOSIS — L309 Dermatitis, unspecified: Secondary | ICD-10-CM | POA: Diagnosis not present

## 2017-01-20 DIAGNOSIS — E7800 Pure hypercholesterolemia, unspecified: Secondary | ICD-10-CM | POA: Diagnosis not present

## 2017-01-27 NOTE — Pre-Procedure Instructions (Signed)
Raymond Graham  01/27/2017      [facility] - [place], [place] - [address] [place] [address] [address] Raymond Graham [id number] Phone: [phone] Fax: [phone]    Your procedure is scheduled on Oct. 30  [facility] at 530 [facility].M.  Call this number if you have problems the morning of surgery:  [phone]   Remember:  Do not eat food or drink liquids after midnight.  Take these medicines the morning of surgery with A SIP OF WATER Clonidine (Catapres), gabapentin (Neurontin),  Loratadine (Claritin) if needed, Metoprolol (Toprol-XL), Omeprazole (Prilosec), Tamsulosin HCL (Flomax)  Stop taking aspirin, BC's, Goody's, Herbal medications, Aleve, Ibuprofen, Advil, Motrin   Do not wear jewelry, make-up or nail polish.  Do not wear lotions, powders, or perfumes, or deoderant.  Do not shave 48 hours prior to surgery.  Men may shave face and neck.  Do not bring valuables to the hospital.  [facility] is not responsible for any belongings or valuables.  Contacts, dentures or bridgework may not be worn into surgery.  Leave your suitcase in the car.  After surgery it may be brought to your room.  For patients admitted to the hospital, discharge time will be determined by your treatment team.  Patients discharged the day of surgery will not be allowed to drive home.    Special instructions:  [place] - Preparing for Surgery  Before surgery, you can play an important role.  Because skin is not sterile, your skin needs to be as free of germs as possible.  You can reduce the number of germs on you skin by washing with CHG (chlorahexidine gluconate) soap before surgery.  CHG is an antiseptic cleaner which kills germs and bonds with the skin to continue killing germs even after washing.  Please DO NOT use if you have an allergy to CHG or antibacterial soaps.  If your skin becomes reddened/irritated stop using the CHG and  inform your nurse when you arrive at Short Stay.  Do not shave (including legs and underarms) for at least 48 hours prior to the first CHG shower.  You may shave your face.  Please follow these instructions carefully:   1.  Shower with CHG Soap the night before surgery and the   morning of Surgery.  2.  If you choose to wash your hair, wash your hair first as usual with your  normal shampoo.  3.  After you shampoo, rinse your hair and body thoroughly to remove the Shampoo.  4.  Use CHG as you would any other liquid soap.  You can apply chg directly  to the skin and wash gently with scrungie or a clean washcloth.  5.  Apply the CHG Soap to your body ONLY FROM THE NECK DOWN.  Do not use on open wounds or open sores.  Avoid contact with your eyes,  ears, mouth and genitals (private parts).  Wash genitals (private parts) with your normal soap.  6.  Wash thoroughly, paying special attention to the area where your surgery  will be performed.  7.  Thoroughly rinse your body with warm water from the neck down.  8.  DO NOT shower/wash with your normal soap after using and rinsing off  the CHG Soap.  9.  Pat yourself dry with a clean towel.            10.  Wear clean pajamas.  11.  Place clean sheets on your bed the night of your first shower and do not sleep with pets.  Day of Surgery  Do not apply any lotions/deoderants the morning of surgery.  Please wear clean clothes to the hospital/surgery center.     Please read over the following fact sheets that you were given. Pain Booklet, Coughing and Deep Breathing and Surgical Site Infection Prevention

## 2017-01-30 ENCOUNTER — Encounter (HOSPITAL_COMMUNITY): Payer: Self-pay

## 2017-01-30 ENCOUNTER — Encounter (HOSPITAL_COMMUNITY)
Admission: RE | Admit: 2017-01-30 | Discharge: 2017-01-30 | Disposition: A | Discharge status: Home / Self Care | Payer: Medicare Other | Source: Ambulatory Visit | Attending: Orthopaedic Surgery | Admitting: Orthopaedic Surgery

## 2017-01-30 ENCOUNTER — Ambulatory Visit (HOSPITAL_COMMUNITY)
Admission: RE | Admit: 2017-01-30 | Discharge: 2017-01-30 | Disposition: A | Discharge status: Home / Self Care | Payer: Medicare Other | Source: Ambulatory Visit | Attending: Orthopaedic Surgery | Admitting: Orthopaedic Surgery

## 2017-01-30 DIAGNOSIS — Z96643 Presence of artificial hip joint, bilateral: Secondary | ICD-10-CM | POA: Diagnosis not present

## 2017-01-30 DIAGNOSIS — R791 Abnormal coagulation profile: Secondary | ICD-10-CM | POA: Diagnosis not present

## 2017-01-30 DIAGNOSIS — N400 Enlarged prostate without lower urinary tract symptoms: Secondary | ICD-10-CM | POA: Insufficient documentation

## 2017-01-30 DIAGNOSIS — Z01818 Encounter for other preprocedural examination: Secondary | ICD-10-CM | POA: Diagnosis not present

## 2017-01-30 DIAGNOSIS — Z96652 Presence of left artificial knee joint: Secondary | ICD-10-CM | POA: Insufficient documentation

## 2017-01-30 DIAGNOSIS — Z0183 Encounter for blood typing: Secondary | ICD-10-CM | POA: Insufficient documentation

## 2017-01-30 DIAGNOSIS — I10 Essential (primary) hypertension: Secondary | ICD-10-CM | POA: Insufficient documentation

## 2017-01-30 DIAGNOSIS — Z87891 Personal history of nicotine dependence: Secondary | ICD-10-CM | POA: Diagnosis not present

## 2017-01-30 DIAGNOSIS — M179 Osteoarthritis of knee, unspecified: Secondary | ICD-10-CM | POA: Diagnosis not present

## 2017-01-30 DIAGNOSIS — K219 Gastro-esophageal reflux disease without esophagitis: Secondary | ICD-10-CM | POA: Diagnosis not present

## 2017-01-30 DIAGNOSIS — M1990 Unspecified osteoarthritis, unspecified site: Secondary | ICD-10-CM | POA: Diagnosis not present

## 2017-01-30 DIAGNOSIS — Z01812 Encounter for preprocedural laboratory examination: Secondary | ICD-10-CM | POA: Insufficient documentation

## 2017-01-30 LAB — URINALYSIS, ROUTINE W REFLEX MICROSCOPIC
Bilirubin Urine: NEGATIVE
GLUCOSE, UA: NEGATIVE mg/dL
HGB URINE DIPSTICK: NEGATIVE
Ketones, ur: NEGATIVE mg/dL
Leukocytes, UA: NEGATIVE
Nitrite: NEGATIVE
PH: 5 (ref 5.0–8.0)
PROTEIN: NEGATIVE mg/dL
Specific Gravity, Urine: 1.01 (ref 1.005–1.030)

## 2017-01-30 LAB — BASIC METABOLIC PANEL
ANION GAP: 9 (ref 5–15)
BUN: 14 mg/dL (ref 6–20)
CALCIUM: 9.6 mg/dL (ref 8.9–10.3)
CO2: 25 mmol/L (ref 22–32)
Chloride: 103 mmol/L (ref 101–111)
Creatinine, Ser: 1.13 mg/dL (ref 0.61–1.24)
Glucose, Bld: 107 mg/dL — ABNORMAL HIGH (ref 65–99)
Potassium: 4.1 mmol/L (ref 3.5–5.1)
SODIUM: 137 mmol/L (ref 135–145)

## 2017-01-30 LAB — CBC WITH DIFFERENTIAL/PLATELET
BASOS ABS: 0 10*3/uL (ref 0.0–0.1)
BASOS PCT: 0 %
Eosinophils Absolute: 0.1 10*3/uL (ref 0.0–0.7)
Eosinophils Relative: 2 %
HEMATOCRIT: 40.6 % (ref 39.0–52.0)
HEMOGLOBIN: 13.3 g/dL (ref 13.0–17.0)
Lymphocytes Relative: 17 %
Lymphs Abs: 1.2 10*3/uL (ref 0.7–4.0)
MCH: 29.1 pg (ref 26.0–34.0)
MCHC: 32.8 g/dL (ref 30.0–36.0)
MCV: 88.8 fL (ref 78.0–100.0)
Monocytes Absolute: 0.5 10*3/uL (ref 0.1–1.0)
Monocytes Relative: 8 %
NEUTROS ABS: 5.1 10*3/uL (ref 1.7–7.7)
NEUTROS PCT: 73 %
Platelets: 218 10*3/uL (ref 150–400)
RBC: 4.57 MIL/uL (ref 4.22–5.81)
RDW: 13.9 % (ref 11.5–15.5)
WBC: 6.9 10*3/uL (ref 4.0–10.5)

## 2017-01-30 LAB — TYPE AND SCREEN
ABO/RH(D): AB POS
Antibody Screen: NEGATIVE

## 2017-01-30 LAB — PROTIME-INR
INR: 1.66
PROTHROMBIN TIME: 19.5 s — AB (ref 11.4–15.2)

## 2017-01-30 LAB — APTT: APTT: 29 s (ref 24–36)

## 2017-01-30 LAB — SURGICAL PCR SCREEN
MRSA, PCR: NEGATIVE
Staphylococcus aureus: NEGATIVE

## 2017-01-30 NOTE — Progress Notes (Signed)
PCP - Sami Roseanne RenoHassan Cardiologist - denies  Chest x-ray - 01/30/17 EKG - 07/2016 Stress Test -> 5 yers  ECHO - denies Cardiac Cath - denies    Patient denies shortness of breath, fever, cough and chest pain at PAT appointment   Patient verbalized understanding of instructions that were given to them at the PAT appointment. Patient was also instructed that they will need to review over the PAT instructions again at home before surgery.

## 2017-02-01 NOTE — Progress Notes (Signed)
Anesthesia Chart Review: Patient is a 66 year old male scheduled for right TKA on 02/07/17 by Dr. Marcene CorningPeter Dalldorf.   History includes former smoker (quit '93), HTN, BPH, GERD, arthritis, left THA 05/31/11 (general anesthesia), right THA 06/10/03, left TKA 11/21/11 (spinal anesthesia), L3-S1 foraminotomies, exploratory lap with repair of pyloric ulcer 07/23/16. BMI is consistent with obesity.  PCP is listed as Dr. Quitman LivingsSami Hassan [facility] in [place]). Patient is not routinely followed by a cardiologist, but he was referred to Dr. Yates DecampJay Ganji in 2012 prior to undergoing 2013 hip and knee replacement surgeries and had a negative exercise stress test.   BP (!) 160/90 Comment: taken manually/notified Jessica RN  Pulse (!) 56   Temp 36.8 C   Resp 20   Ht 5\' 8"  (1.727 m)   Wt 212 lb 3.2 oz (96.3 kg)   SpO2 99%   BMI 32.26 kg/m   EKG 07/22/16: SB at 58 bpm.   ETT 07/07/10 [facility]; scanned under Media tab, Correspondence 06/03/11): Negative. EKG shows NSR. Stress EKG negative for ischemia. Hypertensive BP response. No exercise induced arrhythmias. Exercise tolerance is above average, achieved 12.5 METS. Recommend continue primary prevention.  1V CXR 01/30/17: IMPRESSION: No acute abnormality seen.  Preoperative labs noted. Cr 1.13, glucose 107. CBC WNL. PTT 29. PT 19.5, INR 1.66. T&S done. In review of previous PT, he had a result of 19.9 on 05/31/11 with repeat of 20.5 prior to left THA; 19.8 with repeat of 19.3 prior to left TKA 11/21/11. He did have a spinal on 11/21/11. No reported bleeding issues.   Reviewed chronically elevated PT/INR since 2013 with anesthesiologist Dr. Arrie AranStephen Turk. Unknown etiology--patient is not on warfarin. Recommend to repeat PT/INR on the day of surgery and notify surgeon. I have notified Olegario MessierKathy at Dr. Nolon Nationsalldorf's office. Defer any additional recommendations to surgeon.  Velna Ochsllison Reinhard Schack, PA-C [facility]Anesthesiology Phone [phone] 02/01/2017  4:12 PM

## 2017-02-06 MED ORDER — LACTATED RINGERS IV SOLN
INTRAVENOUS | Status: DC
  Administered 2017-02-07: 07:00:00 via INTRAVENOUS

## 2017-02-06 MED ORDER — TRANEXAMIC ACID 1000 MG/10ML IV SOLN
2000.0000 mg | INTRAVENOUS | Status: AC
  Administered 2017-02-07: 2000 mg via TOPICAL
  Filled 2017-02-06: qty 20

## 2017-02-06 MED ORDER — CEFAZOLIN SODIUM-DEXTROSE 2-4 GM/100ML-% IV SOLN
2.0000 g | INTRAVENOUS | Status: AC
  Administered 2017-02-07: 2 g via INTRAVENOUS
  Filled 2017-02-06: qty 100

## 2017-02-06 NOTE — H&P (Signed)
TOTAL KNEE ADMISSION H&P  Patient is being admitted for right total knee arthroplasty.  Subjective:  Chief Complaint:right knee pain.  HPI: Raymond Graham, 66 y.o. male, has a history of pain and functional disability in the right knee due to arthritis and has failed non-surgical conservative treatments for greater than 12 weeks to includeNSAID's and/or analgesics, corticosteriod injections, viscosupplementation injections, flexibility and strengthening excercises, use of assistive devices, weight reduction as appropriate and activity modification.  Onset of symptoms was gradual, starting 5 years ago with gradually worsening course since that time. The patient noted no past surgery on the right knee(s).  Patient currently rates pain in the right knee(s) at 10 out of 10 with activity. Patient has night pain, worsening of pain with activity and weight bearing, pain that interferes with activities of daily living, crepitus and joint swelling.  Patient has evidence of subchondral cysts, subchondral sclerosis, periarticular osteophytes and joint space narrowing by imaging studies. There is no active infection.  Patient Active Problem List   Diagnosis Date Noted  . Perforated chronic peptic ulcer (HCC) 07/23/2016  . 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 S/P left THA, AA 05/31/2011   Past Medical History:  Diagnosis Date  . Arthritis    PAIN AND ARTHRITIS LEFT HIP AND LEFT KNEE osteoarthritis  . BPH (benign prostatic hypertrophy)    PT ON FLOMAX-DR. WRENN IS PT'S UROLOGIST  . GERD (gastroesophageal reflux disease)    OCAS-NO MEDS  . Hypertension   . Seasonal allergies     Past Surgical History:  Procedure Laterality Date  . BACK SURGERY  10/2008   LUMBAR SURGERY FOR STENOSIS  . COLONOSCOPY    . JOINT REPLACEMENT  2005   RIGHT HIP REPLACEMENT   . LAPAROTOMY N/A 07/23/2016   Procedure: EXPLORATORY LAPAROTOMY WITH GRAHAM PATCH REPAIR OF PYLORIC ULCER;  Surgeon: Almond LintFaera Byerly, MD;  Location: [facility] OR;   Service: General;  Laterality: N/A;  . LEFT BUNIONECTOMY    . LEFT GANGLION CYST REMOVED    . TOTAL HIP ARTHROPLASTY  05/31/2011   Procedure: TOTAL HIP ARTHROPLASTY ANTERIOR APPROACH;  Surgeon: Shelda PalMatthew D Olin, MD;  Location: [facility];  Service: Orthopedics;  Laterality: Left;  . TOTAL KNEE ARTHROPLASTY  11/21/2011   Procedure: TOTAL KNEE ARTHROPLASTY;  Surgeon: Shelda PalMatthew D Olin, MD;  Location: [facility] ORS;  Service: Orthopedics;  Laterality: Left;    Current Facility-Administered Medications  Medication Dose Route Frequency Provider Last Rate Last Dose  . [START ON 02/07/2017] ceFAZolin (ANCEF) IVPB 2g/100 mL premix  2 g Intravenous To Ilean SkillSS-Surg Dalldorf, Peter, MD      . Melene Muller[START ON 02/07/2017] lactated ringers infusion   Intravenous Continuous Marcene Corningalldorf, Peter, MD      . Melene Muller[START ON 02/07/2017] tranexamic acid (CYKLOKAPRON) 2,000 mg in sodium chloride 0.9 % 50 mL Topical Application  2,000 mg Topical To OR Marcene Corningalldorf, Peter, MD       Current Outpatient Prescriptions  Medication Sig Dispense Refill Last Dose  . cholecalciferol (VITAMIN D) 1000 units tablet Take 3,000 Units by mouth daily.    Past Week at Unknown time  . ciprofloxacin (CIPRO) 500 MG tablet Take 500 mg by mouth 2 (two) times daily as needed (for VPH).      . cloNIDine (CATAPRES) 0.1 MG tablet Take 0.1 mg by mouth 2 (two) times daily.   Past Week at Unknown time  . gabapentin (NEURONTIN) 100 MG capsule Take 100 mg by mouth 3 (three) times daily.   Past Week at Unknown time  .  HYDROcodone-acetaminophen (NORCO/VICODIN) 5-325 MG tablet Take 1 tablet by mouth every 6 (six) hours as needed for moderate pain.     Marland Kitchen loratadine (CLARITIN) 10 MG tablet Take 10 mg by mouth daily as needed for allergies.     Marland Kitchen losartan-hydrochlorothiazide (HYZAAR) 50-12.5 MG per tablet Take 1 tablet by mouth every morning.   Past Week at Unknown time  . Menthol, Topical Analgesic, (BENGAY EX) Apply 1 application topically as needed (for pain).     . methocarbamol (ROBAXIN)  500 MG tablet Take 1 tablet (500 mg total) by mouth every 6 (six) hours as needed for muscle spasms. (Patient taking differently: Take 500 mg by mouth at bedtime as needed for muscle spasms. ) 30 tablet 0   . metoprolol (TOPROL-XL) 200 MG 24 hr tablet Take 200 mg by mouth daily.     . metoprolol succinate (TOPROL-XL) 100 MG 24 hr tablet Take 200 mg by mouth daily after breakfast. Take with or immediately following a meal.   Past Week at Unknown time  . Multiple Vitamin (MULTIVITAMIN WITH MINERALS) TABS Take 1 tablet by mouth daily.   Past Week at Unknown time  . omeprazole (PRILOSEC) 20 MG capsule Take 20 mg by mouth daily.     . Tamsulosin HCl (FLOMAX) 0.4 MG CAPS Take 0.4 mg by mouth daily after breakfast.   Past Week at Unknown time  . traZODone (DESYREL) 150 MG tablet Take 150 mg by mouth at bedtime as needed for sleep.   Past Week at Unknown time  . Oxycodone HCl 10 MG TABS Take 0.5 tablets (5 mg total) by mouth every 4 (four) hours as needed. (Patient not taking: Reported on 01/24/2017) 40 tablet 0 Completed Course at Unknown time  . pantoprazole (PROTONIX) 40 MG tablet Take 1 tablet (40 mg total) by mouth 2 (two) times daily. (Patient not taking: Reported on 01/24/2017) 60 tablet 2 Not Taking at Unknown time  . traMADol (ULTRAM) 50 MG tablet Take 1 tablet (50 mg total) by mouth every 8 (eight) hours as needed. (Patient not taking: Reported on 01/24/2017) 40 tablet 0 Completed Course at Unknown time   No Known Allergies  Social History  Substance Use Topics  . Smoking status: Former Smoker    Quit date: 04/12/1991  . Smokeless tobacco: Never Used     Comment: QUIT SMOKING ABOUT 15 OR 20 YRS AGO-ONLY SMOKED ON WEEKENDS  . Alcohol use 1.2 oz/week    2 Shots of liquor per week     Comment: occasionally    No family history on file.   Review of Systems  Musculoskeletal: Positive for joint pain.       Right knee  All other systems reviewed and are negative.   Objective:  Physical Exam   Constitutional: He is oriented to person, place, and time. He appears well-developed and well-nourished.  HENT:  Head: Normocephalic and atraumatic.  Eyes: Pupils are equal, round, and reactive to light.  Neck: Normal range of motion.  Cardiovascular: Normal rate.   Respiratory: Effort normal.  GI: Soft.  Musculoskeletal:  Right knee motion is good at about 0-120.  He has the same motion at the opposite knee where he has a healed incision.  On the right he has crepitation and medial joint line pain.  Hip motion is full and painfree and SLR is negative on both sides.  There is no palpable LAD behind either knee.  Sensation and motor function are intact on both sides and there are palpable  pulses on both sides.    Neurological: He is alert and oriented to person, place, and time.  Skin: Skin is warm and dry.  Psychiatric: He has a normal mood and affect. His behavior is normal. Judgment and thought content normal.    Vital signs in last 24 hours:    Labs:   Estimated body mass index is 32.26 kg/m as calculated from the following:   Height as of 01/30/17: 5\' 8"  (1.727 m).   Weight as of 01/30/17: 96.3 kg (212 lb 3.2 oz).   Imaging Review Plain radiographs demonstrate severe degenerative joint disease of the right knee(s). The overall alignment isneutral. The bone quality appears to be good for age and reported activity level.  Assessment/Plan:  End stage primary arthritis, right knee   The patient history, physical examination, clinical judgment of the provider and imaging studies are consistent with end stage degenerative joint disease of the right knee(s) and total knee arthroplasty is deemed medically necessary. The treatment options including medical management, injection therapy arthroscopy and arthroplasty were discussed at length. The risks and benefits of total knee arthroplasty were presented and reviewed. The risks due to aseptic loosening, infection, stiffness, patella  tracking problems, thromboembolic complications and other imponderables were discussed. The patient acknowledged the explanation, agreed to proceed with the plan and consent was signed. Patient is being admitted for inpatient treatment for surgery, pain control, PT, OT, prophylactic antibiotics, VTE prophylaxis, progressive ambulation and ADL's and discharge planning. The patient is planning to be discharged home with home health services

## 2017-02-06 NOTE — Anesthesia Preprocedure Evaluation (Signed)
Anesthesia Evaluation  Patient identified by MRN, date of birth, ID band Patient awake    Reviewed: Allergy & Precautions, H&P , Patient's Chart, lab work & pertinent test results, reviewed documented beta blocker date and time   Airway Mallampati: II  TM Distance: >3 FB Neck ROM: full    Dental no notable dental hx.    Pulmonary former smoker,    Pulmonary exam normal breath sounds clear to auscultation       Cardiovascular hypertension,  Rhythm:regular Rate:Normal     Neuro/Psych    GI/Hepatic   Endo/Other    Renal/GU      Musculoskeletal   Abdominal   Peds  Hematology   Anesthesia Other Findings   Reproductive/Obstetrics                             Anesthesia Physical Anesthesia Plan  ASA: III  Anesthesia Plan: Spinal   Post-op Pain Management:    Induction:   PONV Risk Score and Plan: 2 and Ondansetron, Dexamethasone and Treatment may vary due to age or medical condition  Airway Management Planned:   Additional Equipment:   Intra-op Plan:   Post-operative Plan:   Informed Consent: I have reviewed the patients History and Physical, chart, labs and discussed the procedure including the risks, benefits and alternatives for the proposed anesthesia with the patient or authorized representative who has indicated his/her understanding and acceptance.   Dental Advisory Given  Plan Discussed with: CRNA and Surgeon  Anesthesia Plan Comments: (  )        Anesthesia Quick Evaluation  

## 2017-02-07 ENCOUNTER — Encounter (HOSPITAL_COMMUNITY): Payer: Self-pay | Admitting: Certified Registered"

## 2017-02-07 ENCOUNTER — Inpatient Hospital Stay (HOSPITAL_COMMUNITY)
Admission: RE | Admit: 2017-02-07 | Discharge: 2017-02-09 | DRG: 470 | Disposition: A | Discharge status: Home Health | Payer: Medicare Other | Source: Ambulatory Visit | Attending: Orthopaedic Surgery | Admitting: Orthopaedic Surgery

## 2017-02-07 ENCOUNTER — Inpatient Hospital Stay (HOSPITAL_COMMUNITY): Payer: Medicare Other | Admitting: Vascular Surgery

## 2017-02-07 ENCOUNTER — Encounter (HOSPITAL_COMMUNITY)
Admission: RE | Disposition: A | Discharge status: Home Health | Payer: Self-pay | Source: Ambulatory Visit | Attending: Orthopaedic Surgery

## 2017-02-07 DIAGNOSIS — Z96651 Presence of right artificial knee joint: Secondary | ICD-10-CM | POA: Diagnosis not present

## 2017-02-07 DIAGNOSIS — Z96643 Presence of artificial hip joint, bilateral: Secondary | ICD-10-CM | POA: Diagnosis not present

## 2017-02-07 DIAGNOSIS — I10 Essential (primary) hypertension: Secondary | ICD-10-CM | POA: Diagnosis not present

## 2017-02-07 DIAGNOSIS — K219 Gastro-esophageal reflux disease without esophagitis: Secondary | ICD-10-CM | POA: Diagnosis present

## 2017-02-07 DIAGNOSIS — M1711 Unilateral primary osteoarthritis, right knee: Secondary | ICD-10-CM | POA: Diagnosis not present

## 2017-02-07 DIAGNOSIS — K275 Chronic or unspecified peptic ulcer, site unspecified, with perforation: Secondary | ICD-10-CM | POA: Diagnosis not present

## 2017-02-07 DIAGNOSIS — Z87891 Personal history of nicotine dependence: Secondary | ICD-10-CM | POA: Diagnosis not present

## 2017-02-07 DIAGNOSIS — Z96652 Presence of left artificial knee joint: Secondary | ICD-10-CM | POA: Diagnosis not present

## 2017-02-07 DIAGNOSIS — Z79899 Other long term (current) drug therapy: Secondary | ICD-10-CM | POA: Diagnosis not present

## 2017-02-07 DIAGNOSIS — M25561 Pain in right knee: Secondary | ICD-10-CM | POA: Diagnosis present

## 2017-02-07 DIAGNOSIS — G8918 Other acute postprocedural pain: Secondary | ICD-10-CM | POA: Diagnosis not present

## 2017-02-07 HISTORY — PX: TOTAL KNEE ARTHROPLASTY: SHX125

## 2017-02-07 LAB — PROTIME-INR
INR: 1.77
PROTHROMBIN TIME: 20.5 s — AB (ref 11.4–15.2)

## 2017-02-07 SURGERY — ARTHROPLASTY, KNEE, TOTAL
Anesthesia: Spinal | Site: Knee | Laterality: Right

## 2017-02-07 MED ORDER — TRANEXAMIC ACID 1000 MG/10ML IV SOLN
1000.0000 mg | Freq: Once | INTRAVENOUS | Status: AC
  Administered 2017-02-07: 1000 mg via INTRAVENOUS
  Filled 2017-02-07: qty 10

## 2017-02-07 MED ORDER — LOSARTAN POTASSIUM-HCTZ 50-12.5 MG PO TABS: 1.0000 | ORAL_TABLET | Freq: Every morning | ORAL | Status: DC

## 2017-02-07 MED ORDER — HYDROCODONE-ACETAMINOPHEN 7.5-325 MG PO TABS: 1.0000 | ORAL_TABLET | ORAL | Status: DC | PRN

## 2017-02-07 MED ORDER — BISACODYL 5 MG PO TBEC: 5.0000 mg | DELAYED_RELEASE_TABLET | Freq: Every day | ORAL | Status: DC | PRN

## 2017-02-07 MED ORDER — ACETAMINOPHEN 325 MG PO TABS: 650.0000 mg | ORAL_TABLET | ORAL | Status: DC | PRN

## 2017-02-07 MED ORDER — CEFAZOLIN SODIUM-DEXTROSE 2-4 GM/100ML-% IV SOLN
2.0000 g | Freq: Four times a day (QID) | INTRAVENOUS | Status: AC
  Administered 2017-02-07: 2 g via INTRAVENOUS
  Filled 2017-02-07 (×2): qty 100

## 2017-02-07 MED ORDER — CHLORHEXIDINE GLUCONATE 4 % EX LIQD: 60.0000 mL | Freq: Once | CUTANEOUS | Status: DC

## 2017-02-07 MED ORDER — BUPIVACAINE LIPOSOME 1.3 % IJ SUSP
20.0000 mL | INTRAMUSCULAR | Status: AC
  Administered 2017-02-07: 20 mL
  Filled 2017-02-07: qty 20

## 2017-02-07 MED ORDER — LIDOCAINE 2% (20 MG/ML) 5 ML SYRINGE
INTRAMUSCULAR | Status: AC
  Filled 2017-02-07: qty 5

## 2017-02-07 MED ORDER — METOPROLOL SUCCINATE ER 100 MG PO TB24
200.0000 mg | ORAL_TABLET | Freq: Every day | ORAL | Status: DC
  Administered 2017-02-08 – 2017-02-09 (×2): 200 mg via ORAL
  Filled 2017-02-07 (×2): qty 2

## 2017-02-07 MED ORDER — ONDANSETRON HCL 4 MG/2ML IJ SOLN
INTRAMUSCULAR | Status: DC | PRN
  Administered 2017-02-07: 4 mg via INTRAVENOUS

## 2017-02-07 MED ORDER — ACETAMINOPHEN 650 MG RE SUPP: 650.0000 mg | RECTAL | Status: DC | PRN

## 2017-02-07 MED ORDER — MIDAZOLAM HCL 2 MG/2ML IJ SOLN
INTRAMUSCULAR | Status: AC
  Filled 2017-02-07: qty 2

## 2017-02-07 MED ORDER — ONDANSETRON HCL 4 MG/2ML IJ SOLN: 4.0000 mg | Freq: Four times a day (QID) | INTRAMUSCULAR | Status: DC | PRN

## 2017-02-07 MED ORDER — CLONIDINE HCL 0.1 MG PO TABS
0.1000 mg | ORAL_TABLET | Freq: Two times a day (BID) | ORAL | Status: DC
  Administered 2017-02-07 – 2017-02-09 (×4): 0.1 mg via ORAL
  Filled 2017-02-07 (×4): qty 1

## 2017-02-07 MED ORDER — ALUM & MAG HYDROXIDE-SIMETH 200-200-20 MG/5ML PO SUSP: 30.0000 mL | ORAL | Status: DC | PRN

## 2017-02-07 MED ORDER — METOCLOPRAMIDE HCL 5 MG PO TABS: 5.0000 mg | ORAL_TABLET | Freq: Three times a day (TID) | ORAL | Status: DC | PRN

## 2017-02-07 MED ORDER — DEXTROSE 5 % IV SOLN
INTRAVENOUS | Status: DC | PRN
  Administered 2017-02-07: 50 ug/min via INTRAVENOUS

## 2017-02-07 MED ORDER — FENTANYL CITRATE (PF) 100 MCG/2ML IJ SOLN: 25.0000 ug | INTRAMUSCULAR | Status: DC | PRN

## 2017-02-07 MED ORDER — FENTANYL CITRATE (PF) 250 MCG/5ML IJ SOLN
INTRAMUSCULAR | Status: DC | PRN
  Administered 2017-02-07 (×2): 50 ug via INTRAVENOUS

## 2017-02-07 MED ORDER — LORATADINE 10 MG PO TABS: 10.0000 mg | ORAL_TABLET | Freq: Every day | ORAL | Status: DC | PRN

## 2017-02-07 MED ORDER — BUPIVACAINE-EPINEPHRINE (PF) 0.5% -1:200000 IJ SOLN
INTRAMUSCULAR | Status: AC
  Filled 2017-02-07: qty 30

## 2017-02-07 MED ORDER — TRANEXAMIC ACID 1000 MG/10ML IV SOLN
1000.0000 mg | INTRAVENOUS | Status: AC
  Administered 2017-02-07: 1000 mg via INTRAVENOUS
  Filled 2017-02-07: qty 10

## 2017-02-07 MED ORDER — TAMSULOSIN HCL 0.4 MG PO CAPS
0.4000 mg | ORAL_CAPSULE | Freq: Every day | ORAL | Status: DC
  Administered 2017-02-08 – 2017-02-09 (×2): 0.4 mg via ORAL
  Filled 2017-02-07 (×2): qty 1

## 2017-02-07 MED ORDER — DEXAMETHASONE SODIUM PHOSPHATE 10 MG/ML IJ SOLN
INTRAMUSCULAR | Status: DC | PRN
  Administered 2017-02-07: 5 mg via INTRAVENOUS

## 2017-02-07 MED ORDER — TRAZODONE HCL 50 MG PO TABS
150.0000 mg | ORAL_TABLET | Freq: Every evening | ORAL | Status: DC | PRN
  Administered 2017-02-08: 150 mg via ORAL
  Filled 2017-02-07: qty 1

## 2017-02-07 MED ORDER — LOSARTAN POTASSIUM 50 MG PO TABS
50.0000 mg | ORAL_TABLET | Freq: Every day | ORAL | Status: DC
  Administered 2017-02-07 – 2017-02-09 (×3): 50 mg via ORAL
  Filled 2017-02-07 (×3): qty 1

## 2017-02-07 MED ORDER — PROPOFOL 10 MG/ML IV BOLUS
INTRAVENOUS | Status: AC
  Filled 2017-02-07: qty 20

## 2017-02-07 MED ORDER — HYDROMORPHONE HCL 1 MG/ML IJ SOLN
0.5000 mg | INTRAMUSCULAR | Status: DC | PRN
  Administered 2017-02-08: 1 mg via INTRAVENOUS
  Filled 2017-02-07: qty 1

## 2017-02-07 MED ORDER — ASPIRIN EC 325 MG PO TBEC
325.0000 mg | DELAYED_RELEASE_TABLET | Freq: Two times a day (BID) | ORAL | Status: DC
  Administered 2017-02-08 – 2017-02-09 (×3): 325 mg via ORAL
  Filled 2017-02-07 (×3): qty 1

## 2017-02-07 MED ORDER — PANTOPRAZOLE SODIUM 40 MG PO TBEC
40.0000 mg | DELAYED_RELEASE_TABLET | Freq: Every day | ORAL | Status: DC
  Administered 2017-02-08 – 2017-02-09 (×2): 40 mg via ORAL
  Filled 2017-02-07 (×2): qty 1

## 2017-02-07 MED ORDER — MIDAZOLAM HCL 5 MG/5ML IJ SOLN
INTRAMUSCULAR | Status: DC | PRN
  Administered 2017-02-07: 2 mg via INTRAVENOUS

## 2017-02-07 MED ORDER — BUPIVACAINE-EPINEPHRINE (PF) 0.5% -1:200000 IJ SOLN
INTRAMUSCULAR | Status: DC | PRN
  Administered 2017-02-07: 30 mL

## 2017-02-07 MED ORDER — LACTATED RINGERS IV SOLN: INTRAVENOUS | Status: DC

## 2017-02-07 MED ORDER — MENTHOL 3 MG MT LOZG: 1.0000 | LOZENGE | OROMUCOSAL | Status: DC | PRN

## 2017-02-07 MED ORDER — SODIUM CHLORIDE 0.9 % IJ SOLN
INTRAMUSCULAR | Status: DC | PRN
  Administered 2017-02-07: 30 mL

## 2017-02-07 MED ORDER — BUPIVACAINE-EPINEPHRINE (PF) 0.5% -1:200000 IJ SOLN
INTRAMUSCULAR | Status: DC | PRN
  Administered 2017-02-07: 20 mL via PERINEURAL

## 2017-02-07 MED ORDER — ONDANSETRON HCL 4 MG PO TABS
4.0000 mg | ORAL_TABLET | Freq: Four times a day (QID) | ORAL | Status: DC | PRN
  Administered 2017-02-07 – 2017-02-08 (×2): 4 mg via ORAL
  Filled 2017-02-07 (×2): qty 1

## 2017-02-07 MED ORDER — DOCUSATE SODIUM 100 MG PO CAPS
100.0000 mg | ORAL_CAPSULE | Freq: Two times a day (BID) | ORAL | Status: DC
  Administered 2017-02-07 – 2017-02-09 (×4): 100 mg via ORAL
  Filled 2017-02-07 (×4): qty 1

## 2017-02-07 MED ORDER — PHENOL 1.4 % MT LIQD: 1.0000 | OROMUCOSAL | Status: DC | PRN

## 2017-02-07 MED ORDER — DEXTROSE 5 % IV SOLN
500.0000 mg | Freq: Four times a day (QID) | INTRAVENOUS | Status: DC | PRN
  Administered 2017-02-07: 500 mg via INTRAVENOUS
  Filled 2017-02-07: qty 5

## 2017-02-07 MED ORDER — GABAPENTIN 100 MG PO CAPS
100.0000 mg | ORAL_CAPSULE | Freq: Three times a day (TID) | ORAL | Status: DC
  Administered 2017-02-07 – 2017-02-09 (×5): 100 mg via ORAL
  Filled 2017-02-07 (×5): qty 1

## 2017-02-07 MED ORDER — METOCLOPRAMIDE HCL 5 MG/ML IJ SOLN: 5.0000 mg | Freq: Three times a day (TID) | INTRAMUSCULAR | Status: DC | PRN

## 2017-02-07 MED ORDER — HYDROCODONE-ACETAMINOPHEN 7.5-325 MG PO TABS
2.0000 | ORAL_TABLET | ORAL | Status: DC | PRN
  Administered 2017-02-07 – 2017-02-09 (×9): 2 via ORAL
  Filled 2017-02-07 (×9): qty 2

## 2017-02-07 MED ORDER — FENTANYL CITRATE (PF) 250 MCG/5ML IJ SOLN
INTRAMUSCULAR | Status: AC
  Filled 2017-02-07: qty 5

## 2017-02-07 MED ORDER — PHENYLEPHRINE 40 MCG/ML (10ML) SYRINGE FOR IV PUSH (FOR BLOOD PRESSURE SUPPORT)
PREFILLED_SYRINGE | INTRAVENOUS | Status: AC
  Filled 2017-02-07: qty 10

## 2017-02-07 MED ORDER — EPHEDRINE 5 MG/ML INJ
INTRAVENOUS | Status: AC
  Filled 2017-02-07: qty 10

## 2017-02-07 MED ORDER — METHOCARBAMOL 500 MG PO TABS
500.0000 mg | ORAL_TABLET | Freq: Four times a day (QID) | ORAL | Status: DC | PRN
  Administered 2017-02-07: 500 mg via ORAL
  Filled 2017-02-07: qty 1

## 2017-02-07 MED ORDER — PROPOFOL 500 MG/50ML IV EMUL
INTRAVENOUS | Status: DC | PRN
  Administered 2017-02-07: 100 ug/kg/min via INTRAVENOUS

## 2017-02-07 MED ORDER — DIPHENHYDRAMINE HCL 12.5 MG/5ML PO ELIX: 12.5000 mg | ORAL_SOLUTION | ORAL | Status: DC | PRN

## 2017-02-07 MED ORDER — HYDROCHLOROTHIAZIDE 12.5 MG PO CAPS
12.5000 mg | ORAL_CAPSULE | Freq: Every day | ORAL | Status: DC
  Administered 2017-02-08 – 2017-02-09 (×2): 12.5 mg via ORAL
  Filled 2017-02-07 (×2): qty 1

## 2017-02-07 MED ORDER — 0.9 % SODIUM CHLORIDE (POUR BTL) OPTIME
TOPICAL | Status: DC | PRN
  Administered 2017-02-07: 1000 mL

## 2017-02-07 SURGICAL SUPPLY — 58 items
BAG DECANTER FOR FLEXI CONT (MISCELLANEOUS) IMPLANT
BANDAGE ACE 6X5 VEL STRL LF (GAUZE/BANDAGES/DRESSINGS) ×2 IMPLANT
BANDAGE ESMARK 6X9 LF (GAUZE/BANDAGES/DRESSINGS) ×1 IMPLANT
BLADE SAGITTAL 25.0X1.19X90 (BLADE) IMPLANT
BLADE SAGITTAL 25.0X1.19X90MM (BLADE)
BLADE SAW SGTL 13.0X1.19X90.0M (BLADE) IMPLANT
BNDG CMPR 9X6 STRL LF SNTH (GAUZE/BANDAGES/DRESSINGS) ×1
BNDG CMPR MED 10X6 ELC LF (GAUZE/BANDAGES/DRESSINGS) ×1
BNDG ELASTIC 6X10 VLCR STRL LF (GAUZE/BANDAGES/DRESSINGS) ×3 IMPLANT
BNDG ESMARK 6X9 LF (GAUZE/BANDAGES/DRESSINGS) ×3
BOWL SMART MIX CTS (DISPOSABLE) ×3 IMPLANT
CAPT KNEE TOTAL 3 ATTUNE ×2 IMPLANT
CEMENT HV SMART SET (Cement) ×6 IMPLANT
CLOSURE WOUND 1/2 X4 (GAUZE/BANDAGES/DRESSINGS) ×1
COVER SURGICAL LIGHT HANDLE (MISCELLANEOUS) ×3 IMPLANT
CUFF TOURNIQUET SINGLE 34IN LL (TOURNIQUET CUFF) ×3 IMPLANT
CUFF TOURNIQUET SINGLE 44IN (TOURNIQUET CUFF) IMPLANT
DECANTER SPIKE VIAL GLASS SM (MISCELLANEOUS) ×3 IMPLANT
DRAPE EXTREMITY T 121X128X90 (DRAPE) ×3 IMPLANT
DRAPE HALF SHEET 40X57 (DRAPES) ×6 IMPLANT
DRAPE U-SHAPE 47X51 STRL (DRAPES) ×3 IMPLANT
DRSG AQUACEL AG ADV 3.5X10 (GAUZE/BANDAGES/DRESSINGS) ×3 IMPLANT
DURAPREP 26ML APPLICATOR (WOUND CARE) ×3 IMPLANT
ELECT REM PT RETURN 9FT ADLT (ELECTROSURGICAL) ×3
ELECTRODE REM PT RTRN 9FT ADLT (ELECTROSURGICAL) ×1 IMPLANT
FACESHIELD WRAPAROUND (MASK) ×3 IMPLANT
FACESHIELD WRAPAROUND OR TEAM (MASK) IMPLANT
GLOVE BIO SURGEON STRL SZ8 (GLOVE) ×6 IMPLANT
GLOVE BIOGEL PI IND STRL 8 (GLOVE) ×2 IMPLANT
GLOVE BIOGEL PI INDICATOR 8 (GLOVE) ×4
GOWN STRL REUS W/ TWL LRG LVL3 (GOWN DISPOSABLE) ×1 IMPLANT
GOWN STRL REUS W/ TWL XL LVL3 (GOWN DISPOSABLE) ×2 IMPLANT
GOWN STRL REUS W/TWL LRG LVL3 (GOWN DISPOSABLE) ×3
GOWN STRL REUS W/TWL XL LVL3 (GOWN DISPOSABLE) ×6
HANDPIECE INTERPULSE COAX TIP (DISPOSABLE) ×3
HOOD PEEL AWAY FACE SHEILD DIS (HOOD) ×6 IMPLANT
IMMOBILIZER KNEE 22 UNIV (SOFTGOODS) ×3 IMPLANT
KIT BASIN OR (CUSTOM PROCEDURE TRAY) ×3 IMPLANT
KIT ROOM TURNOVER OR (KITS) ×3 IMPLANT
MANIFOLD NEPTUNE II (INSTRUMENTS) ×3 IMPLANT
NDL HYPO 21X1 ECLIPSE (NEEDLE) ×1 IMPLANT
NEEDLE HYPO 21X1 ECLIPSE (NEEDLE) ×3 IMPLANT
NS IRRIG 1000ML POUR BTL (IV SOLUTION) ×3 IMPLANT
PACK TOTAL JOINT (CUSTOM PROCEDURE TRAY) ×3 IMPLANT
PAD ARMBOARD 7.5X6 YLW CONV (MISCELLANEOUS) ×6 IMPLANT
PIN STEINMAN FIXATION KNEE (PIN) IMPLANT
SET HNDPC FAN SPRY TIP SCT (DISPOSABLE) ×1 IMPLANT
STRIP CLOSURE SKIN 1/2X4 (GAUZE/BANDAGES/DRESSINGS) ×2 IMPLANT
SUT VIC AB 0 CT1 27 (SUTURE) ×3
SUT VIC AB 0 CT1 27XBRD ANBCTR (SUTURE) ×1 IMPLANT
SUT VIC AB 2-0 CT1 27 (SUTURE) ×3
SUT VIC AB 2-0 CT1 TAPERPNT 27 (SUTURE) ×1 IMPLANT
SUT VIC AB 3-0 FS2 27 (SUTURE) ×3 IMPLANT
SUT VLOC 180 0 24IN GS25 (SUTURE) ×3 IMPLANT
SYR 50ML LL SCALE MARK (SYRINGE) ×3 IMPLANT
TOWEL OR 17X24 6PK STRL BLUE (TOWEL DISPOSABLE) ×3 IMPLANT
TOWEL OR 17X26 10 PK STRL BLUE (TOWEL DISPOSABLE) ×3 IMPLANT
TRAY CATH 16FR W/PLASTIC CATH (SET/KITS/TRAYS/PACK) IMPLANT

## 2017-02-07 NOTE — Op Note (Signed)
PREOP DIAGNOSIS: DJD RIGHT KNEE POSTOP DIAGNOSIS: same PROCEDURE: RIGHT TKR ANESTHESIA: Spinal and MAC ATTENDING SURGEON: Syriah Delisi G ASSISTANTLoni Dolly PA  INDICATIONS FOR PROCEDURE: Raymond Graham is a 66 y.o. male who has struggled for a long time with pain due to degenerative arthritis of the right knee.  The patient has failed many conservative non-operative measures and at this point has pain which limits the ability to sleep and walk.  The patient is offered total knee replacement.  Informed operative consent was obtained after discussion of possible risks of anesthesia, infection, neurovascular injury, DVT, and death.  The importance of the post-operative rehabilitation protocol to optimize result was stressed extensively with the patient.  SUMMARY OF FINDINGS AND PROCEDURE:  Raymond Graham was taken to the operative suite where under the above anesthesia a right knee replacement was performed.  There were advanced degenerative changes and the bone quality was excellent.  We used the DePuy Attune system and placed size 7 femur, 8 tibia, 38 mm all polyethylene patella, and a size 6 mm spacer.  Loni Dolly PA-C assisted throughout and was invaluable to the completion of the case in that he helped retract and maintain exposure while I placed components.  He also helped close thereby minimizing OR time.  The patient was admitted for appropriate post-op care to include perioperative antibiotics and mechanical and pharmacologic measures for DVT prophylaxis.  DESCRIPTION OF PROCEDURE:  [name] was taken to the operative suite where the above anesthesia was applied.  The patient was positioned supine and prepped and draped in normal sterile fashion.  An appropriate time out was performed.  After the administration of kefzol pre-op antibiotic the leg was elevated and exsanguinated and a tourniquet inflated. A standard longitudinal incision was made on the anterior knee.  Dissection  was carried down to the extensor mechanism.  All appropriate anti-infective measures were used including the pre-operative antibiotic, betadine impregnated drape, and closed hooded exhaust systems for each member of the surgical team.  A medial parapatellar incision was made in the extensor mechanism and the knee cap flipped and the knee flexed.  Some residual meniscal tissues were removed along with any remaining ACL/PCL tissue.  A guide was placed on the tibia and a flat cut was made on it's superior surface.  An intramedullary guide was placed in the femur and was utilized to make anterior and posterior cuts creating an appropriate flexion gap.  A second intramedullary guide was placed in the femur to make a distal cut properly balancing the knee with an extension gap equal to the flexion gap.  The three bones sized to the above mentioned sizes and the appropriate guides were placed and utilized.  A trial reduction was done and the knee easily came to full extension and the patella tracked well on flexion.  The trial components were removed and all bones were cleaned with pulsatile lavage and then dried thoroughly.  Cement was mixed and was pressurized onto the bones followed by placement of the aforementioned components.  Excess cement was trimmed and pressure was held on the components until the cement had hardened.  The tourniquet was deflated and a small amount of bleeding was controlled with cautery and pressure.  The knee was irrigated thoroughly.  The extensor mechanism was re-approximated with V-loc suture in running fashion.  The knee was flexed and the repair was solid.  The subcutaneous tissues were re-approximated with #0 and #2-0 vicryl and the skin closed with a  subcuticular stitch and steristrips.  A sterile dressing was applied.  Intraoperative fluids, EBL, and tourniquet time can be obtained from anesthesia records.  DISPOSITION:  The patient was taken to recovery room in stable condition and  admitted for appropriate post-op care to include peri-operative antibiotic and DVT prophylaxis with mechanical and pharmacologic measures.  Raymond Graham G 02/07/2017, 9:13 AM

## 2017-02-07 NOTE — Progress Notes (Signed)
Orthopedic Tech Progress Note Patient Details:  Maxie BetterLarry S Scherr 20-Jan-1951 [id number]  CPM Right Knee CPM Right Knee: On Right Knee Flexion (Degrees): 90 Right Knee Extension (Degrees): 0 Additional Comments: foot roll   Saul FordyceJennifer C Eila Runyan 02/07/2017, 10:31 AM

## 2017-02-07 NOTE — Evaluation (Signed)
Physical Therapy Evaluation Patient Details Name: Raymond Graham MRN: [id number] DOB: 1950/04/25 Today's Date: 02/07/2017   History of Present Illness  Pt is a 66 y.o. male s/p elective R TKA on 02/07/17. Pertinent PMH includes HTN, GERD, arthritis, L TKA (2013), L THA (2013).    Clinical Impression  Pt presents with pain and an overall decrease in functional mobility secondary to above. PTA, pt indep and lives at home with wife who will be available for 24/7 assist if needed. Of note, pt has had previous bilateral THA and L TKA. Educ on precautions, positioning, therex, and importance of mobility. Today, pt able to transfer and amb 60' with RW and min guard for balance. Overall, pt is moving very well despite increase in pain. Pt would benefit from continued acute PT services to maximize functional mobility and independence prior to d/c with HHPT.     Follow Up Recommendations Home health PT;DC plan and follow up therapy as arranged by surgeon    Equipment Recommendations  None recommended by PT (owns DME)    Recommendations for Other Services       Precautions / Restrictions Precautions Precautions: Knee Precaution Comments: Verbally reviewed precautions Required Braces or Orthoses: Knee Immobilizer - Left Restrictions Weight Bearing Restrictions: Yes RLE Weight Bearing: Weight bearing as tolerated      Mobility  Bed Mobility Overal bed mobility: Needs Assistance Bed Mobility: Supine to Sit     Supine to sit: Supervision;HOB elevated     General bed mobility comments: No physical assist required; supervision for safety.   Transfers Overall transfer level: Needs assistance Equipment used: Rolling walker (2 wheeled) Transfers: Sit to/from Stand Sit to Stand: Min guard         General transfer comment: Stood x2 from bed with RW and min guard for balance; educ on hand placement with RW. Able to don shirt/pants in sitting and standing with min guard, but no physical  assist required  Ambulation/Gait Ambulation/Gait assistance: Min guard Ambulation Distance (Feet): 60 Feet Assistive device: Rolling walker (2 wheeled) Gait Pattern/deviations: Step-through pattern;Decreased weight shift to right;Decreased step length - left;Antalgic;Trunk flexed Gait velocity: Decreased Gait velocity interpretation: <1.8 ft/sec, indicative of risk for recurrent falls General Gait Details: Slow, antalgic amb to/from bathroom and into hallway with RW and min guard for balance. Pt heavily reliant on BUEs to offload R knee secondary to pain, requiring cues for upright posture and WBAT  Stairs            Wheelchair Mobility    Modified Rankin (Stroke Patients Only)       Balance Overall balance assessment: Needs assistance Sitting-balance support: No upper extremity supported;Feet supported;Feet unsupported Sitting balance-Leahy Scale: Good     Standing balance support: No upper extremity supported;Bilateral upper extremity supported;During functional activity Standing balance-Leahy Scale: Fair Standing balance comment: Able to don pants and void at toilet with no UE support                             Pertinent Vitals/Pain Pain Assessment: 0-10 Pain Score: 7  Pain Descriptors / Indicators: Operative site guarding;Sore Pain Intervention(s): Limited activity within patient's tolerance;Monitored during session;RN gave pain meds during session    Home Living Family/patient expects to be discharged to:: Private residence Living Arrangements: Spouse/significant other Available Help at Discharge: Family;Available 24 hours/day Type of Home: House Home Access: Stairs to enter Entrance Stairs-Rails: None   Home Layout: One level Home Equipment:  Walker - 2 wheels;Bedside commode      Prior Function Level of Independence: Independent               Hand Dominance        Extremity/Trunk Assessment   Upper Extremity Assessment Upper  Extremity Assessment: Overall WFL for tasks assessed    Lower Extremity Assessment Lower Extremity Assessment: RLE deficits/detail RLE Deficits / Details: s/p R TKA; R hip flex 3/5, knee flex/ext 3/5 RLE: Unable to fully assess due to pain    Cervical / Trunk Assessment Cervical / Trunk Assessment: Normal  Communication   Communication: No difficulties  Cognition Arousal/Alertness: Awake/alert Behavior During Therapy: WFL for tasks assessed/performed Overall Cognitive Status: Within Functional Limits for tasks assessed                                        General Comments      Exercises Total Joint Exercises Ankle Circles/Pumps: AROM;Both;10 reps;Seated Quad Sets: AROM;Both;10 reps;Seated Long Arc Quad: AROM;Right;10 reps;Seated   Assessment/Plan    PT Assessment Patient needs continued PT services  PT Problem List Decreased strength;Decreased range of motion;Decreased activity tolerance;Decreased balance;Decreased mobility;Decreased knowledge of use of DME;Decreased knowledge of precautions;Pain       PT Treatment Interventions DME instruction;Gait training;Stair training;Functional mobility training;Therapeutic activities;Therapeutic exercise;Balance training;Patient/family education    PT Goals (Current goals can be found in the Care Plan section)  Acute Rehab PT Goals Patient Stated Goal: Return home PT Goal Formulation: With patient Time For Goal Achievement: 02/21/17 Potential to Achieve Goals: Good    Frequency 7X/week   Barriers to discharge        Co-evaluation               AM-PAC PT "6 Clicks" Daily Activity  Outcome Measure Difficulty turning over in bed (including adjusting bedclothes, sheets and blankets)?: None Difficulty moving from lying on back to sitting on the side of the bed? : A Little Difficulty sitting down on and standing up from a chair with arms (e.g., wheelchair, bedside commode, etc,.)?: A Little Help needed  moving to and from a bed to chair (including a wheelchair)?: A Little Help needed walking in hospital room?: A Little Help needed climbing 3-5 steps with a railing? : A Little 6 Click Score: 19    End of Session Equipment Utilized During Treatment: Gait belt Activity Tolerance: Patient tolerated treatment well Patient left: in chair;with call bell/phone within reach Nurse Communication: Mobility status PT Visit Diagnosis: Other abnormalities of gait and mobility (R26.89);Pain Pain - Right/Left: Right Pain - part of body: Knee    Time: [id number] PT Time Calculation (min) (ACUTE ONLY): 34 min   Charges:   PT Evaluation $PT Eval Low Complexity: 1 Low PT Treatments $Gait Training: 8-22 mins   PT G Codes:       Ina HomesJaclyn Jasline Buskirk, PT, DPT Acute Rehab Services  Pager: [phone]  Malachy ChamberJaclyn L Polk Minor 02/07/2017, 5:22 PM

## 2017-02-07 NOTE — Interval H&P Note (Signed)
History and Physical Interval Note:  02/07/2017 7:25 AM  Raymond Graham  has presented today for surgery, with the diagnosis of RIGHT KNEE DEGENERATIVE JOINT DISEASE  The various methods of treatment have been discussed with the patient and family. After consideration of risks, benefits and other options for treatment, the patient has consented to  Procedure(s): TOTAL KNEE ARTHROPLASTY (Right) as a surgical intervention .  The patient's history has been reviewed, patient examined, no change in status, stable for surgery.  I have reviewed the patient's chart and labs.  Questions were answered to the patient's satisfaction.     [name]

## 2017-02-07 NOTE — Transfer of Care (Signed)
Immediate Anesthesia Transfer of Care Note  Patient: Raymond Graham  Procedure(s) Performed: TOTAL KNEE ARTHROPLASTY (Right Knee)  Patient Location: PACU  Anesthesia Type:Spinal  Level of Consciousness: awake, alert  and oriented  Airway & Oxygen Therapy: Patient Spontanous Breathing  Post-op Assessment: Report given to RN and Post -op Vital signs reviewed and stable  Post vital signs: Reviewed and stable  Last Vitals:  Vitals:   02/07/17 0539  BP: (!) 138/93  Pulse: (!) 56  Resp: 20  Temp: 36.5 C  SpO2: 98%    Last Pain:  Vitals:   02/07/17 0610  TempSrc:   PainSc: 7       Patients Stated Pain Goal: 3 (02/07/17 0610)  Complications: No apparent anesthesia complications

## 2017-02-07 NOTE — Anesthesia Procedure Notes (Signed)
Spinal  Patient location during procedure: OR Staffing Anesthesiologist: Cristela BlueJACKSON, Raymond Granja Spinal Block Patient position: sitting Prep: DuraPrep Patient monitoring: heart rate, blood pressure and continuous pulse ox Approach: right paramedian Location: L3-4 Injection technique: single-shot Needle Needle type: Sprotte  Needle gauge: 24 G Needle length: 9 cm Assessment Sensory level: T6 Additional Notes Spinal Dosage in OR  .75% Bupivicaine ml       1.9

## 2017-02-07 NOTE — Anesthesia Procedure Notes (Signed)
Anesthesia Regional Block: Adductor canal block   Pre-Anesthetic Checklist: ,, timeout performed, Correct Patient, Correct Site, Correct Laterality, Correct Procedure, Correct Position, site marked, Risks and benefits discussed, pre-op evaluation,  At surgeon's request and post-op pain management  Laterality: Right  Prep: chloraprep       Needles:   Needle Type: Echogenic Needle     Needle Length: 9cm  Needle Gauge: 21     Additional Needles:   Procedures:,,,, ultrasound used (permanent image in chart),,,,  Narrative:  Start time: 02/07/2017 7:01 AM End time: 02/07/2017 7:07 AM Injection made incrementally with aspirations every 5 mL. Anesthesiologist: Cristela BlueJACKSON, Sarath Privott

## 2017-02-08 ENCOUNTER — Encounter (HOSPITAL_COMMUNITY): Payer: Self-pay | Admitting: Orthopaedic Surgery

## 2017-02-08 MED ORDER — FLUTICASONE PROPIONATE 50 MCG/ACT NA SUSP
1.0000 | Freq: Every day | NASAL | Status: DC
  Administered 2017-02-08 – 2017-02-09 (×2): 1 via NASAL
  Filled 2017-02-08: qty 16

## 2017-02-08 NOTE — Evaluation (Signed)
Occupational Therapy Evaluation Patient Details Name: Raymond Graham MRN: [id number] DOB: 09-14-1950 Today's Date: 02/08/2017    History of Present Illness Pt is a 66 y.o. male s/p elective R TKA on 02/07/17. Pertinent PMH includes HTN, GERD, arthritis, L TKA (2013), L THA (2013).   Clinical Impression   This 66 y/o M presents with the above. At baseline Pt is independent with ADLs and functional mobility, with intermittent use of SPC. Pt completed functional mobility, toilet and simulated shower transfer with overall MinGuard assist this session at RW level, requires MinA for LB ADLs secondary to R LE functional limitations. Pt will return home with spouse, reports she is able to assist with ADLs PRN. Education provided and questions answered throughout. Feel Pt will safely progress home with spouse assist PRN. No further acute OT needs identified at this time. Will sign off.     Follow Up Recommendations  DC plan and follow up therapy as arranged by surgeon;Supervision/Assistance - 24 hour    Equipment Recommendations  None recommended by OT           Precautions / Restrictions Precautions Precautions: Knee Precaution Booklet Issued: Yes (comment) Precaution Comments: Reviewed ther ex handout  Restrictions Weight Bearing Restrictions: Yes RLE Weight Bearing: Weight bearing as tolerated      Mobility Bed Mobility               General bed mobility comments: In chair upon entry   Transfers Overall transfer level: Needs assistance Equipment used: Rolling walker (2 wheeled) Transfers: Sit to/from Stand Sit to Stand: Supervision         General transfer comment: Supervision for safety.     Balance Overall balance assessment: Needs assistance Sitting-balance support: No upper extremity supported;Feet supported;Feet unsupported Sitting balance-Leahy Scale: Good     Standing balance support: Bilateral upper extremity supported;No upper extremity  supported;During functional activity Standing balance-Leahy Scale: Fair Standing balance comment: Able to static stand with no UE support                           ADL either performed or assessed with clinical judgement   ADL Overall ADL's : Needs assistance/impaired Eating/Feeding: Set up;Sitting   Grooming: Min guard;Standing;Wash/dry hands   Upper Body Bathing: Min guard;Sitting   Lower Body Bathing: Minimal assistance;Sit to/from stand   Upper Body Dressing : Min guard;Sitting   Lower Body Dressing: Minimal assistance;Sit to/from stand   Toilet Transfer: Min guard;Ambulation;BSC;RW Toilet Transfer Details (indicate cue type and reason): BSC over toilet  Toileting- Clothing Manipulation and Hygiene: Min guard;Sit to/from stand Toileting - Clothing Manipulation Details (indicate cue type and reason): Pt stood to void bladder with MinGuard in standing Tub/ Shower Transfer: Walk-in shower;Ambulation;3 in 1;Rolling walker;Min guard;Minimal assistance Tub/Shower Transfer Details (indicate cue type and reason): MinGuard with intermittent MinA for increased safety/balance; educated on use of 3:1 for use in shower  Functional mobility during ADLs: Min guard;Rolling walker General ADL Comments: educated on compensatory techniques for completing ADLs                         Pertinent Vitals/Pain Pain Assessment: Faces Pain Score: 8  Faces Pain Scale: Hurts little more Pain Location: R knee  Pain Descriptors / Indicators: Operative site guarding;Sore Pain Intervention(s): Monitored during session;Ice applied          Extremity/Trunk Assessment Upper Extremity Assessment Upper Extremity Assessment: Overall WFL for tasks assessed  Lower Extremity Assessment Lower Extremity Assessment: Defer to PT evaluation   Cervical / Trunk Assessment Cervical / Trunk Assessment: Normal   Communication Communication Communication: No difficulties   Cognition  Arousal/Alertness: Awake/alert Behavior During Therapy: WFL for tasks assessed/performed Overall Cognitive Status: Within Functional Limits for tasks assessed                                                     Home Living Family/patient expects to be discharged to:: Private residence Living Arrangements: Spouse/significant other Available Help at Discharge: Family;Available 24 hours/day Type of Home: House Home Access: Stairs to enter   Entrance Stairs-Rails: None Home Layout: One level     Bathroom Shower/Tub: Producer, television/film/videoWalk-in shower   Bathroom Toilet: Standard     Home Equipment: Environmental consultantWalker - 2 wheels;Bedside commode;Shower seat          Prior Functioning/Environment Level of Independence: Independent                 OT Problem List: Decreased strength;Decreased activity tolerance;Decreased knowledge of use of DME or AE;Decreased knowledge of precautions      OT Treatment/Interventions:      OT Goals(Current goals can be found in the care plan section) Acute Rehab OT Goals Patient Stated Goal: Return home OT Goal Formulation: All assessment and education complete, DC therapy  OT Frequency: Min 2X/week                             AM-PAC PT "6 Clicks" Daily Activity     Outcome Measure Help from another person eating meals?: None Help from another person taking care of personal grooming?: A Little Help from another person toileting, which includes using toliet, bedpan, or urinal?: A Little Help from another person bathing (including washing, rinsing, drying)?: A Little Help from another person to put on and taking off regular upper body clothing?: None Help from another person to put on and taking off regular lower body clothing?: A Little 6 Click Score: 20   End of Session Equipment Utilized During Treatment: Gait belt;Rolling walker CPM Right Knee CPM Right Knee: Off  Activity Tolerance: Patient tolerated treatment well Patient  left: in chair;with call bell/phone within reach  OT Visit Diagnosis: Other abnormalities of gait and mobility (R26.89)                Time: [id number] OT Time Calculation (min): 21 min Charges:  OT General Charges $OT Visit: 1 Visit OT Evaluation $OT Eval Low Complexity: 1 Low G-Codes:     Marcy SirenBreanna Handsome Anglin, OT Pager [phone] 02/08/2017   Orlando PennerBreanna L Charlottie Peragine 02/08/2017, 4:26 PM

## 2017-02-08 NOTE — Progress Notes (Signed)
Subjective: 1 Day Post-Op Procedure(s) (LRB): TOTAL KNEE ARTHROPLASTY (Right)  Patient is doing very well. Minimal pain. He does have nasal congestionand is asking for some flonase.    Activity level:  wbat Diet tolerance:  ok Voiding:  ok Patient reports pain as mild.    Objective: Vital signs in last 24 hours: Temp:  [97 F (36.1 C)-98.4 F (36.9 C)] 97.6 F (36.4 C) (10/31 0540) Pulse Rate:  [44-62] 59 (10/31 0540) Resp:  [10-20] 16 (10/31 0540) BP: (101-160)/(64-90) 141/82 (10/31 0540) SpO2:  [95 %-100 %] 99 % (10/31 0540)  Labs: No results for input(s): HGB in the last 72 hours. No results for input(s): WBC, RBC, HCT, PLT in the last 72 hours. No results for input(s): NA, K, CL, CO2, BUN, CREATININE, GLUCOSE, CALCIUM in the last 72 hours.  Recent Labs  02/07/17 0622  INR 1.77    Physical Exam:  Neurologically intact ABD soft Neurovascular intact Sensation intact distally Intact pulses distally Dorsiflexion/Plantar flexion intact Incision: dressing C/D/I and no drainage No cellulitis present Compartment soft  Assessment/Plan:  1 Day Post-Op Procedure(s) (LRB): TOTAL KNEE ARTHROPLASTY (Right) Advance diet Up with therapy D/C IV fluids Plan for discharge tomorrow Discharge home with home health if doing well and cleared by PT. Follow up in office 2 weeks post op. Continue on ASA 325mg  BID x 2 weeks post op. I have placed an order for flonase per patients request. I will remove ACE wrap tomorrow.  Raymond Graham, Ginger OrganNDREW PAUL 02/08/2017, 7:43 AM

## 2017-02-08 NOTE — Progress Notes (Signed)
Physical Therapy Treatment Patient Details Name: Raymond BetterLarry S Graham MRN: [id number] DOB: Dec 18, 1950 Today's Date: 02/08/2017    History of Present Illness Pt is a 66 y.o. male s/p elective R TKA on 02/07/17. Pertinent PMH includes HTN, GERD, arthritis, L TKA (2013), L THA (2013).   PT Comments    Pt progressing well with mobility. Able to transfer and amb 200' with RW and supervision; slow, antalgic gait secondary to increased pain, but improved motion with amb. Declined therex this session secondary to increased pain post-amb. Will plan for stair training and therex progression this afternoon.    Follow Up Recommendations  Home health PT;DC plan and follow up therapy as arranged by surgeon     Equipment Recommendations  None recommended by PT (owns DME)    Recommendations for Other Services OT consult     Precautions / Restrictions Precautions Precautions: Knee Restrictions Weight Bearing Restrictions: Yes RLE Weight Bearing: Weight bearing as tolerated    Mobility  Bed Mobility Overal bed mobility: Modified Independent       Supine to sit: HOB elevated;Modified independent (Device/Increase time)        Transfers Overall transfer level: Needs assistance Equipment used: Rolling walker (2 wheeled) Transfers: Sit to/from Stand Sit to Stand: Supervision         General transfer comment: Supervision for safety; good technique with RW  Ambulation/Gait Ambulation/Gait assistance: Supervision Ambulation Distance (Feet): 200 Feet Assistive device: Rolling walker (2 wheeled) Gait Pattern/deviations: Step-through pattern;Decreased weight shift to right;Trunk flexed Gait velocity: Decreased Gait velocity interpretation: <1.8 ft/sec, indicative of risk for recurrent falls General Gait Details: Slow, antalgic gait with RW and supervision for safety; cues to maintain upright posture as pt tends to rely heavily on BUEs. Able to increase WBAT on R knee with cues. Cues to  step-through with LLE   Stairs            Wheelchair Mobility    Modified Rankin (Stroke Patients Only)       Balance Overall balance assessment: Needs assistance Sitting-balance support: No upper extremity supported;Feet supported;Feet unsupported Sitting balance-Leahy Scale: Good     Standing balance support: No upper extremity supported;Bilateral upper extremity supported;During functional activity Standing balance-Leahy Scale: Fair Standing balance comment: Able to static stand with no UE support                            Cognition Arousal/Alertness: Awake/alert Behavior During Therapy: WFL for tasks assessed/performed Overall Cognitive Status: Within Functional Limits for tasks assessed                                        Exercises      General Comments        Pertinent Vitals/Pain Pain Assessment: Faces Faces Pain Scale: Hurts even more Pain Location: R knee Pain Descriptors / Indicators: Operative site guarding;Sore Pain Intervention(s): Monitored during session;Ice applied    Home Living                      Prior Function            PT Goals (current goals can now be found in the care plan section) Acute Rehab PT Goals Patient Stated Goal: Return home PT Goal Formulation: With patient Time For Goal Achievement: 02/21/17 Potential to Achieve Goals: Good Progress towards PT goals:  Progressing toward goals    Frequency    7X/week      PT Plan Current plan remains appropriate    Co-evaluation              AM-PAC PT "6 Clicks" Daily Activity  Outcome Measure  Difficulty turning over in bed (including adjusting bedclothes, sheets and blankets)?: None Difficulty moving from lying on back to sitting on the side of the bed? : A Little Difficulty sitting down on and standing up from a chair with arms (e.g., wheelchair, bedside commode, etc,.)?: A Little Help needed moving to and from a bed to  chair (including a wheelchair)?: A Little Help needed walking in hospital room?: A Little Help needed climbing 3-5 steps with a railing? : A Little 6 Click Score: 19    End of Session Equipment Utilized During Treatment: Gait belt Activity Tolerance: Patient tolerated treatment well Patient left: in chair;with call bell/phone within reach Nurse Communication: Mobility status PT Visit Diagnosis: Other abnormalities of gait and mobility (R26.89);Pain Pain - Right/Left: Right Pain - part of body: Knee     Time: 0454-0981 PT Time Calculation (min) (ACUTE ONLY): 24 min  Charges:  $Gait Training: 23-37 mins                    G Codes:      Ina Homes, PT, DPT Acute Rehab Services  Pager: [phone]  Malachy Chamber 02/08/2017, 12:03 PM

## 2017-02-08 NOTE — Progress Notes (Signed)
02/08/17 1450  PT Visit Information  Last PT Received On 02/08/17  Assistance Needed +1  History of Present Illness Pt is a 66 y.o. male s/p elective R TKA on 02/07/17. Pertinent PMH includes HTN, GERD, arthritis, L TKA (2013), L THA (2013).  Subjective Data  Patient Stated Goal Return home  Precautions  Precautions Knee  Precaution Booklet Issued Yes (comment)  Precaution Comments Reviewed ther ex handout   Restrictions  Weight Bearing Restrictions Yes  RLE Weight Bearing WBAT  Pain Assessment  Pain Assessment 0-10  Pain Score 8  Pain Location R knee   Pain Descriptors / Indicators Operative site guarding;Sore  Pain Intervention(s) Monitored during session;Limited activity within patient's tolerance;Repositioned  Cognition  Arousal/Alertness Awake/alert  Behavior During Therapy WFL for tasks assessed/performed  Overall Cognitive Status Within Functional Limits for tasks assessed  Bed Mobility  General bed mobility comments In chair upon entry   Transfers  Overall transfer level Needs assistance  Equipment used Rolling walker (2 wheeled)  Transfers Sit to/from Stand  Sit to Stand Supervision  General transfer comment Supervision for safety.   Ambulation/Gait  Ambulation/Gait assistance Supervision;Min assist  Ambulation Distance (Feet) 150 Feet  Assistive device Rolling walker (2 wheeled)  Gait Pattern/deviations Step-through pattern;Decreased weight shift to right;Trunk flexed;Antalgic  General Gait Details Slow, antalgic gait. Had one LOB requiring min A for steadying when turning with RW. Step through pattern, however, continues to have decreased WB on R secondary to pain.   Gait velocity Decreased  Gait velocity interpretation <1.8 ft/sec, indicative of risk for recurrent falls  Stairs Yes  Stairs assistance Min guard  Stair Management Step to pattern;Forwards;Backwards;With walker  Number of Stairs 4  General stair comments Ascended backwards and descended forwards  with stair navigation. Min guard for safety and verbal cues for safe use of RW for stair management. Cues for LE sequencing. Educated about need for assist and how to have family brace RW. Also administered stair navigation handout.   Balance  Overall balance assessment Needs assistance  Sitting-balance support No upper extremity supported;Feet supported;Feet unsupported  Sitting balance-Leahy Scale Good  Standing balance support Bilateral upper extremity supported;No upper extremity supported;During functional activity  Standing balance-Leahy Scale Fair  Standing balance comment Able to static stand with no UE support  Exercises  Exercises Total Joint  Total Joint Exercises  Ankle Circles/Pumps AROM;Both;20 reps  Quad Sets AROM;Right;10 reps  Towel Squeeze AROM;Both;10 reps  Hip ABduction/ADduction AAROM;Right;10 reps  PT - End of Session  Equipment Utilized During Treatment Gait belt  Activity Tolerance Patient tolerated treatment well  Patient left in chair;with call bell/phone within reach  Nurse Communication Mobility status  CPM Right Knee  CPM Right Knee Off  PT - Assessment/Plan  PT Plan Current plan remains appropriate  PT Visit Diagnosis Other abnormalities of gait and mobility (R26.89);Pain  Pain - Right/Left Right  Pain - part of body Knee  PT Frequency (ACUTE ONLY) 7X/week  Recommendations for Other Services OT consult  Follow Up Recommendations Home health PT;DC plan and follow up therapy as arranged by surgeon  PT equipment None recommended by PT  AM-PAC PT "6 Clicks" Daily Activity Outcome Measure  Difficulty turning over in bed (including adjusting bedclothes, sheets and blankets)? 4  Difficulty moving from lying on back to sitting on the side of the bed?  3  Difficulty sitting down on and standing up from a chair with arms (e.g., wheelchair, bedside commode, etc,.)? 3  Help needed moving to and from a  bed to chair (including a wheelchair)? 3  Help needed walking  in hospital room? 3  Help needed climbing 3-5 steps with a railing?  3  6 Click Score 19  Mobility G Code  CJ  PT Goal Progression  Progress towards PT goals Progressing toward goals  Acute Rehab PT Goals  PT Goal Formulation With patient  Time For Goal Achievement 02/21/17  Potential to Achieve Goals Good  PT Time Calculation  PT Start Time (ACUTE ONLY) 1426  PT Stop Time (ACUTE ONLY) 1449  PT Time Calculation (min) (ACUTE ONLY) 23 min  PT General Charges  $$ ACUTE PT VISIT 1 Visit  PT Treatments  $Gait Training 8-22 mins  $Therapeutic Exercise 8-22 mins   Pt progressing towards goals. Practiced safe stair navigation with RW and required min guard assist. Administered stair navigation with RW handout for home. Min A required X 1 during gait secondary to LOB when turning, otherwise required supervision. Current recommendations remain appropriate. Will continue to follow acutely to maximize functional mobility independence and safety.   Gladys DammeBrittany Jakhi Dishman, PT, DPT  Acute Rehabilitation Services  Pager: [phone]

## 2017-02-09 MED ORDER — PROMETHAZINE HCL 12.5 MG PO TABS: 12.5000 mg | ORAL_TABLET | Freq: Four times a day (QID) | ORAL | 0 refills | Status: DC | PRN

## 2017-02-09 MED ORDER — TIZANIDINE HCL 4 MG PO TABS: 4.0000 mg | ORAL_TABLET | Freq: Four times a day (QID) | ORAL | 0 refills | Status: AC | PRN

## 2017-02-09 MED ORDER — DOCUSATE SODIUM 100 MG PO CAPS: 100.0000 mg | ORAL_CAPSULE | Freq: Two times a day (BID) | ORAL | 0 refills | Status: DC

## 2017-02-09 MED ORDER — BISACODYL 5 MG PO TBEC: 5.0000 mg | DELAYED_RELEASE_TABLET | Freq: Every day | ORAL | 0 refills | Status: DC | PRN

## 2017-02-09 MED ORDER — ASPIRIN 325 MG PO TBEC: 325.0000 mg | DELAYED_RELEASE_TABLET | Freq: Two times a day (BID) | ORAL | 0 refills | Status: DC

## 2017-02-09 MED ORDER — HYDROCODONE-ACETAMINOPHEN 7.5-325 MG PO TABS: 1.0000 | ORAL_TABLET | ORAL | 0 refills | Status: DC | PRN

## 2017-02-09 NOTE — Anesthesia Postprocedure Evaluation (Signed)
Anesthesia Post Note  Patient: Raymond Graham  Procedure(s) Performed: TOTAL KNEE ARTHROPLASTY (Right Knee)     Patient location during evaluation: PACU Anesthesia Type: Spinal Level of consciousness: awake Pain management: satisfactory to patient Vital Signs Assessment: post-procedure vital signs reviewed and stable Respiratory status: spontaneous breathing Cardiovascular status: blood pressure returned to baseline Postop Assessment: no headache and spinal receding Anesthetic complications: no    Last Vitals:  Vitals:   02/09/17 0534 02/09/17 0900  BP: (!) 120/54 110/71  Pulse: 65 60  Resp: 16 16  Temp: 36.9 C 36.9 C  SpO2: 95% 97%    Last Pain:  Vitals:   02/09/17 0900  TempSrc: Oral  PainSc: 7                  Bina Veenstra EDWARD

## 2017-02-09 NOTE — Progress Notes (Signed)
Physical Therapy Treatment Patient Details Name: Raymond Graham MRN: [id number] DOB: 11/07/1950 Today's Date: 02/09/2017    History of Present Illness Pt is a 66 y.o. male s/p elective R TKA on 02/07/17. Pertinent PMH includes HTN, GERD, arthritis, L TKA (2013), L THA (2013).   PT Comments    Pt has progressed well with mobility. Mod indep for transfers and amb 350' with RW; supervision to ascend/descend steps with RW (educ on technique for wife to secure RW on steps into home). Reviewed therex, positioning, fall risk reduction, and importance of continued mobility. All questions answered and education completed. Pt has met short-term acute PT goals. Feel pt is safe to return home with HHPT and assist from wife as needed. D/c acute PT.   Follow Up Recommendations  Home health PT;DC plan and follow up therapy as arranged by surgeon     Equipment Recommendations  None recommended by PT    Recommendations for Other Services       Precautions / Restrictions Precautions Precautions: Knee Restrictions Weight Bearing Restrictions: Yes RLE Weight Bearing: Weight bearing as tolerated    Mobility  Bed Mobility Overal bed mobility: Modified Independent Bed Mobility: Supine to Sit           General bed mobility comments: HOB flat, without use of bedrails   Transfers Overall transfer level: Modified independent Equipment used: Rolling walker (2 wheeled) Transfers: Sit to/from Stand Sit to Stand: Modified independent (Device/Increase time)         General transfer comment: Good technique  Ambulation/Gait Ambulation/Gait assistance: Modified independent (Device/Increase time) Ambulation Distance (Feet): 350 Feet Assistive device: Rolling walker (2 wheeled) Gait Pattern/deviations: Step-through pattern;Decreased weight shift to right;Trunk flexed;Antalgic Gait velocity: Decreased   General Gait Details: Pt able to self-correct increased WBAT and upright posture, only  requiring occasional cues for this. Able to increase amb speed with cues. Overall, good motion of knee with amb   Stairs Stairs: Yes   Stair Management: Step to pattern;Forwards;Backwards;With walker Number of Stairs: 4 General stair comments: Ascended backwards and descended forwards with RW (educ on assist from wife to brace RW on steps). Good technique overall  Wheelchair Mobility    Modified Rankin (Stroke Patients Only)       Balance Overall balance assessment: Needs assistance Sitting-balance support: No upper extremity supported;Feet supported;Feet unsupported Sitting balance-Leahy Scale: Good       Standing balance-Leahy Scale: Fair Standing balance comment: Able to static stand with no UE support                            Cognition Arousal/Alertness: Awake/alert Behavior During Therapy: WFL for tasks assessed/performed Overall Cognitive Status: Within Functional Limits for tasks assessed                                        Exercises      General Comments        Pertinent Vitals/Pain Pain Assessment: 0-10 Pain Score: 6  Pain Location: R knee  Pain Descriptors / Indicators: Operative site guarding;Sore Pain Intervention(s): Monitored during session    Home Living                      Prior Function            PT Goals (current goals can now be found in  the care plan section) Acute Rehab PT Goals Patient Stated Goal: Return home PT Goal Formulation: With patient Time For Goal Achievement: 02/21/17 Potential to Achieve Goals: Good Progress towards PT goals: Goals met/education completed, patient discharged from PT    Frequency    7X/week      PT Plan Current plan remains appropriate    Co-evaluation              AM-PAC PT "6 Clicks" Daily Activity  Outcome Measure  Difficulty turning over in bed (including adjusting bedclothes, sheets and blankets)?: None Difficulty moving from lying on  back to sitting on the side of the bed? : None Difficulty sitting down on and standing up from a chair with arms (e.g., wheelchair, bedside commode, etc,.)?: None Help needed moving to and from a bed to chair (including a wheelchair)?: None Help needed walking in hospital room?: None Help needed climbing 3-5 steps with a railing? : A Little 6 Click Score: 23    End of Session Equipment Utilized During Treatment: Gait belt Activity Tolerance: Patient tolerated treatment well Patient left: in chair;with call bell/phone within reach Nurse Communication: Mobility status PT Visit Diagnosis: Other abnormalities of gait and mobility (R26.89);Pain Pain - Right/Left: Right Pain - part of body: Knee     Time: 0752-0823 PT Time Calculation (min) (ACUTE ONLY): 31 min  Charges:  $Gait Training: 23-37 mins                    G Codes:      Mabeline Caras, PT, DPT Acute Rehab Services  Pager: [place] 02/09/2017, 8:28 AM

## 2017-02-09 NOTE — Discharge Summary (Signed)
Patient ID: Raymond Graham MRN: [id number] DOB/AGE: 19-Apr-1950 66 y.o.  Admit date: 02/07/2017 Discharge date: 02/09/2017  Admission Diagnoses:  Principal Problem:   Primary localized osteoarthritis of right knee Active Problems:   Primary osteoarthritis of right knee   Discharge Diagnoses:  Same  Past Medical History:  Diagnosis Date  . Arthritis    PAIN AND ARTHRITIS LEFT HIP AND LEFT KNEE osteoarthritis  . BPH (benign prostatic hypertrophy)    PT ON FLOMAX-DR. WRENN IS PT'S UROLOGIST  . GERD (gastroesophageal reflux disease)    OCAS-NO MEDS  . Hypertension   . Seasonal allergies     Surgeries: Procedure(s): TOTAL KNEE ARTHROPLASTY on 02/07/2017   Consultants:   Discharged Condition: Improved  Hospital Course: Raymond Graham is an 66 y.o. male who was admitted 02/07/2017 for operative treatment ofPrimary localized osteoarthritis of right knee. Patient has severe unremitting pain that affects sleep, daily activities, and work/hobbies. After pre-op clearance the patient was taken to the operating room on 02/07/2017 and underwent  Procedure(s): TOTAL KNEE ARTHROPLASTY.    Patient was given perioperative antibiotics: Anti-infectives    Start     Dose/Rate Route Frequency Ordered Stop   02/07/17 1630  ceFAZolin (ANCEF) IVPB 2g/100 mL premix     2 g 200 mL/hr over 30 Minutes Intravenous Every 6 hours 02/07/17 1609 02/08/17 0429   02/07/17 0700  ceFAZolin (ANCEF) IVPB 2g/100 mL premix     2 g 200 mL/hr over 30 Minutes Intravenous To ShortStay [facility] 02/06/17 1125 02/07/17 0745       Patient was given sequential compression devices, early ambulation, and chemoprophylaxis to prevent DVT.  Patient benefited maximally from hospital stay and there were no complications.    Recent vital signs: Patient Vitals for the past 24 hrs:  BP Temp Temp src Pulse Resp SpO2  02/09/17 0534 (!) 120/54 98.4 F (36.9 C) Oral 65 16 95 %  02/08/17 1937 121/73 98.4 F (36.9 C) Oral  65 16 98 %     Recent laboratory studies:  Recent Labs  02/07/17 0622  INR 1.77     Discharge Medications:   Allergies as of 02/09/2017   No Known Allergies     Medication List    STOP taking these medications   HYDROcodone-acetaminophen 5-325 MG tablet Commonly known as:  NORCO/VICODIN Replaced by:  HYDROcodone-acetaminophen 7.5-325 MG tablet   Oxycodone HCl 10 MG Tabs     TAKE these medications   aspirin 325 MG EC tablet Take 1 tablet (325 mg total) by mouth 2 (two) times daily after a meal.   BENGAY EX Apply 1 application topically as needed (for pain).   bisacodyl 5 MG EC tablet Commonly known as:  DULCOLAX Take 1 tablet (5 mg total) by mouth daily as needed for moderate constipation.   cholecalciferol 1000 units tablet Commonly known as:  VITAMIN D Take 3,000 Units by mouth daily.   ciprofloxacin 500 MG tablet Commonly known as:  CIPRO Take 500 mg by mouth 2 (two) times daily as needed (for VPH).   cloNIDine 0.1 MG tablet Commonly known as:  CATAPRES Take 0.1 mg by mouth 2 (two) times daily.   docusate sodium 100 MG capsule Commonly known as:  COLACE Take 1 capsule (100 mg total) by mouth 2 (two) times daily.   gabapentin 100 MG capsule Commonly known as:  NEURONTIN Take 100 mg by mouth 3 (three) times daily.   HYDROcodone-acetaminophen 7.5-325 MG tablet Commonly known as:  NORCO Take 1-2 tablets by  mouth every 4 (four) hours as needed for severe pain ((score 7 to 10)). Replaces:  HYDROcodone-acetaminophen 5-325 MG tablet   loratadine 10 MG tablet Commonly known as:  CLARITIN Take 10 mg by mouth daily as needed for allergies.   losartan-hydrochlorothiazide 50-12.5 MG tablet Commonly known as:  HYZAAR Take 1 tablet by mouth every morning.   methocarbamol 500 MG tablet Commonly known as:  ROBAXIN Take 1 tablet (500 mg total) by mouth every 6 (six) hours as needed for muscle spasms. What changed:  when to take this   metoprolol succinate 100  MG 24 hr tablet Commonly known as:  TOPROL-XL Take 200 mg by mouth daily after breakfast. Take with or immediately following a meal.   metoprolol 200 MG 24 hr tablet Commonly known as:  TOPROL-XL Take 200 mg by mouth daily.   multivitamin with minerals Tabs tablet Take 1 tablet by mouth daily.   omeprazole 20 MG capsule Commonly known as:  PRILOSEC Take 20 mg by mouth daily.   pantoprazole 40 MG tablet Commonly known as:  PROTONIX Take 1 tablet (40 mg total) by mouth 2 (two) times daily.   promethazine 12.5 MG tablet Commonly known as:  PHENERGAN Take 1-2 tablets (12.5-25 mg total) by mouth every 6 (six) hours as needed for nausea or vomiting.   tamsulosin 0.4 MG Caps capsule Commonly known as:  FLOMAX Take 0.4 mg by mouth daily after breakfast.   tiZANidine 4 MG tablet Commonly known as:  ZANAFLEX Take 1 tablet (4 mg total) by mouth every 6 (six) hours as needed for muscle spasms.   traMADol 50 MG tablet Commonly known as:  ULTRAM Take 1 tablet (50 mg total) by mouth every 8 (eight) hours as needed.   traZODone 150 MG tablet Commonly known as:  DESYREL Take 150 mg by mouth at bedtime as needed for sleep.            Durable Medical Equipment        Start     Ordered   02/07/17 1610  DME Walker rolling  Once    Question:  Patient needs a walker to treat with the following condition  Answer:  Primary osteoarthritis of right knee   02/07/17 1609   02/07/17 1610  DME 3 n 1  Once     02/07/17 1609   02/07/17 1610  DME Bedside commode  Once    Question:  Patient needs a bedside commode to treat with the following condition  Answer:  Primary osteoarthritis of right knee   02/07/17 1609      Diagnostic Studies: Dg Chest 1 View  Result Date: 01/30/2017 CLINICAL DATA:  Preop evaluation for upcoming knee replacement EXAM: CHEST 1 VIEW COMPARISON:  07/23/2016 FINDINGS: Cardiac shadow is stable. Mild aortic calcifications are seen. The lungs are well aerated  bilaterally. No focal infiltrate or sizable effusion is seen. Degenerative change of the thoracic spine is noted. IMPRESSION: No acute abnormality seen. Electronically Signed   By: Alcide Clever M.D.   On: 01/30/2017 11:06   Dg Knee 1-2 Views Right  Result Date: 01/30/2017 CLINICAL DATA:  Preop evaluation for upcoming knee replacement EXAM: RIGHT KNEE - 1-2 VIEW COMPARISON:  None. FINDINGS: Medial joint space narrowing is noted with mild osteophytic change. No acute fracture or dislocation is noted. No soft tissue abnormality is noted. Mild patellofemoral degenerative changes are noted as well. IMPRESSION: Degenerative change without acute abnormality. Electronically Signed   By: Eulah Pont.D.  On: 01/30/2017 11:05    Disposition: 01-Home or Self Care  Discharge Instructions    Call MD / Call 911    Complete by:  As directed    If you experience chest pain or shortness of breath, CALL 911 and be transported to the hospital emergency room.  If you develope a fever above 101 F, pus (white drainage) or increased drainage or redness at the wound, or calf pain, call your surgeon's office.   Constipation Prevention    Complete by:  As directed    Drink plenty of fluids.  Prune juice may be helpful.  You may use a stool softener, such as Colace (over the counter) 100 mg twice a day.  Use MiraLax (over the counter) for constipation as needed.   Diet - low sodium heart healthy    Complete by:  As directed    Discharge instructions    Complete by:  As directed    INSTRUCTIONS AFTER JOINT REPLACEMENT   Remove items at home which could result in a fall. This includes throw rugs or furniture in walking pathways ICE to the affected joint every three hours while awake for 30 minutes at a time, for at least the first 3-5 days, and then as needed for pain and swelling.  Continue to use ice for pain and swelling. You may notice swelling that will progress down to the foot and ankle.  This is normal after  surgery.  Elevate your leg when you are not up walking on it.   Continue to use the breathing machine you got in the hospital (incentive spirometer) which will help keep your temperature down.  It is common for your temperature to cycle up and down following surgery, especially at night when you are not up moving around and exerting yourself.  The breathing machine keeps your lungs expanded and your temperature down.   DIET:  As you were doing prior to hospitalization, we recommend a well-balanced diet.  DRESSING / WOUND CARE / SHOWERING  You may shower 3 days after surgery, but keep the wounds dry during showering.  You may use an occlusive plastic wrap (Press'n Seal for example), NO SOAKING/SUBMERGING IN THE BATHTUB.  If the bandage gets wet, change with a clean dry gauze.  If the incision gets wet, pat the wound dry with a clean towel.  ACTIVITY  Increase activity slowly as tolerated, but follow the weight bearing instructions below.   No driving for 6 weeks or until further direction given by your physician.  You cannot drive while taking narcotics.  No lifting or carrying greater than 10 lbs. until further directed by your surgeon. Avoid periods of inactivity such as sitting longer than an hour when not asleep. This helps prevent blood clots.  You may return to work once you are authorized by your doctor.     WEIGHT BEARING   Weight bearing as tolerated with assist device (walker, cane, etc) as directed, use it as long as suggested by your surgeon or therapist, typically at least 4-6 weeks.   EXERCISES  Results after joint replacement surgery are often greatly improved when you follow the exercise, range of motion and muscle strengthening exercises prescribed by your doctor. Safety measures are also important to protect the joint from further injury. Any time any of these exercises cause you to have increased pain or swelling, decrease what you are doing until you are comfortable  again and then slowly increase them. If you have problems or questions,  call your caregiver or physical therapist for advice.   Rehabilitation is important following a joint replacement. After just a few days of immobilization, the muscles of the leg can become weakened and shrink (atrophy).  These exercises are designed to build up the tone and strength of the thigh and leg muscles and to improve motion. Often times heat used for twenty to thirty minutes before working out will loosen up your tissues and help with improving the range of motion but do not use heat for the first two weeks following surgery (sometimes heat can increase post-operative swelling).   These exercises can be done on a training (exercise) mat, on the floor, on a table or on a bed. Use whatever works the best and is most comfortable for you.    Use music or television while you are exercising so that the exercises are a pleasant break in your day. This will make your life better with the exercises acting as a break in your routine that you can look forward to.   Perform all exercises about fifteen times, three times per day or as directed.  You should exercise both the operative leg and the other leg as well.   Exercises include:   Quad Sets - Tighten up the muscle on the front of the thigh (Quad) and hold for 5-10 seconds.   Straight Leg Raises - With your knee straight (if you were given a brace, keep it on), lift the leg to 60 degrees, hold for 3 seconds, and slowly lower the leg.  Perform this exercise against resistance later as your leg gets stronger.  Leg Slides: Lying on your back, slowly slide your foot toward your buttocks, bending your knee up off the floor (only go as far as is comfortable). Then slowly slide your foot back down until your leg is flat on the floor again.  Angel Wings: Lying on your back spread your legs to the side as far apart as you can without causing discomfort.  Hamstring Strength:  Lying on your  back, push your heel against the floor with your leg straight by tightening up the muscles of your buttocks.  Repeat, but this time bend your knee to a comfortable angle, and push your heel against the floor.  You may put a pillow under the heel to make it more comfortable if necessary.   A rehabilitation program following joint replacement surgery can speed recovery and prevent re-injury in the future due to weakened muscles. Contact your doctor or a physical therapist for more information on knee rehabilitation.    CONSTIPATION  Constipation is defined medically as fewer than three stools per week and severe constipation as less than one stool per week.  Even if you have a regular bowel pattern at home, your normal regimen is likely to be disrupted due to multiple reasons following surgery.  Combination of anesthesia, postoperative narcotics, change in appetite and fluid intake all can affect your bowels.   YOU MUST use at least one of the following options; they are listed in order of increasing strength to get the job done.  They are all available over the counter, and you may need to use some, POSSIBLY even all of these options:    Drink plenty of fluids (prune juice may be helpful) and high fiber foods Colace 100 mg by mouth twice a day  Senokot for constipation as directed and as needed Dulcolax (bisacodyl), take with full glass of water  Miralax (polyethylene glycol) once  or twice a day as needed.  If you have tried all these things and are unable to have a bowel movement in the first 3-4 days after surgery call either your surgeon or your primary doctor.    If you experience loose stools or diarrhea, hold the medications until you stool forms back up.  If your symptoms do not get better within 1 week or if they get worse, check with your doctor.  If you experience "the worst abdominal pain ever" or develop nausea or vomiting, please contact the office immediately for further  recommendations for treatment.   ITCHING:  If you experience itching with your medications, try taking only a single pain pill, or even half a pain pill at a time.  You can also use Benadryl over the counter for itching or also to help with sleep.   TED HOSE STOCKINGS:  Use stockings on both legs until for at least 2 weeks or as directed by physician office. They may be removed at night for sleeping.  MEDICATIONS:  See your medication summary on the "After Visit Summary" that nursing will review with you.  You may have some home medications which will be placed on hold until you complete the course of blood thinner medication.  It is important for you to complete the blood thinner medication as prescribed.  PRECAUTIONS:  If you experience chest pain or shortness of breath - call 911 immediately for transfer to the hospital emergency department.   If you develop a fever greater that 101 F, purulent drainage from wound, increased redness or drainage from wound, foul odor from the wound/dressing, or calf pain - CONTACT YOUR SURGEON.                                                   FOLLOW-UP APPOINTMENTS:  If you do not already have a post-op appointment, please call the office for an appointment to be seen by your surgeon.  Guidelines for how soon to be seen are listed in your "After Visit Summary", but are typically between 1-4 weeks after surgery.  OTHER INSTRUCTIONS:   Knee Replacement:  Do not place pillow under knee, focus on keeping the knee straight while resting. CPM instructions: 0-90 degrees, 2 hours in the morning, 2 hours in the afternoon, and 2 hours in the evening. Place foam block, curve side up under heel at all times except when in CPM or when walking.  DO NOT modify, tear, cut, or change the foam block in any way.  MAKE SURE YOU:  Understand these instructions.  Get help right away if you are not doing well or get worse.    Thank you for letting us be a part of your medical  care team.  It is a privilege we respect greatly.  We hope these instructions will help you stay on track for a fast and full recovery!   Increase activity slowly as tolerated    Complete by:  As directed       Follow-up Information    Marcene Corning, MD. Schedule an appointment as soon as possible for a visit in 2 weeks.   Specialty:  Orthopedic Surgery Contact information: [address] [place] [address] [phone]            Signed: Drema Halon 02/09/2017, 7:41 AM

## 2017-02-09 NOTE — Care Management Note (Signed)
Case Management Note  Patient Details  Name: Raymond Graham MRN: [id number] Date of Birth: 1950/07/18  Subjective/Objective:    66 yr old gentleman s/p right total knee arthroplasty.               Action/Plan: Case manager spoke with patient and his wife concerning discharge plan. Choice was offered for Home Health Agency. Referral was given to Ayesha RumpfMary Yonjof, [facility], Patient has RW and 3in1 from previous surgery, CPM has been delivered to patient's home.   Expected Discharge Date:  02/09/17               Expected Discharge Plan:  Home w Home Health Services  In-House Referral:  NA  Discharge planning Services  CM Consult  Post Acute Care Choice:  Home Health Choice offered to:  Patient, Spouse  DME Arranged:  N/A DME Agency:  NA  HH Arranged:  PT HH Agency:  [facility]  Status of Service:  Completed, signed off  If discussed at [employer] Meetings, dates discussed:    Additional Comments:  Durenda GuthrieBrady, Daquana Paddock Naomi, RN 02/09/2017, 11:05 AM

## 2017-02-10 DIAGNOSIS — Z96642 Presence of left artificial hip joint: Secondary | ICD-10-CM | POA: Diagnosis not present

## 2017-02-10 DIAGNOSIS — N400 Enlarged prostate without lower urinary tract symptoms: Secondary | ICD-10-CM | POA: Diagnosis not present

## 2017-02-10 DIAGNOSIS — Z9181 History of falling: Secondary | ICD-10-CM | POA: Diagnosis not present

## 2017-02-10 DIAGNOSIS — Z471 Aftercare following joint replacement surgery: Secondary | ICD-10-CM | POA: Diagnosis not present

## 2017-02-10 DIAGNOSIS — Z96652 Presence of left artificial knee joint: Secondary | ICD-10-CM | POA: Diagnosis not present

## 2017-02-10 DIAGNOSIS — I10 Essential (primary) hypertension: Secondary | ICD-10-CM | POA: Diagnosis not present

## 2017-02-10 DIAGNOSIS — Z96651 Presence of right artificial knee joint: Secondary | ICD-10-CM | POA: Diagnosis not present

## 2017-02-14 DIAGNOSIS — Z96642 Presence of left artificial hip joint: Secondary | ICD-10-CM | POA: Diagnosis not present

## 2017-02-14 DIAGNOSIS — Z9181 History of falling: Secondary | ICD-10-CM | POA: Diagnosis not present

## 2017-02-14 DIAGNOSIS — Z96652 Presence of left artificial knee joint: Secondary | ICD-10-CM | POA: Diagnosis not present

## 2017-02-14 DIAGNOSIS — Z471 Aftercare following joint replacement surgery: Secondary | ICD-10-CM | POA: Diagnosis not present

## 2017-02-14 DIAGNOSIS — I10 Essential (primary) hypertension: Secondary | ICD-10-CM | POA: Diagnosis not present

## 2017-02-14 DIAGNOSIS — Z96651 Presence of right artificial knee joint: Secondary | ICD-10-CM | POA: Diagnosis not present

## 2017-02-14 DIAGNOSIS — N400 Enlarged prostate without lower urinary tract symptoms: Secondary | ICD-10-CM | POA: Diagnosis not present

## 2017-02-15 DIAGNOSIS — I10 Essential (primary) hypertension: Secondary | ICD-10-CM | POA: Diagnosis not present

## 2017-02-15 DIAGNOSIS — Z96652 Presence of left artificial knee joint: Secondary | ICD-10-CM | POA: Diagnosis not present

## 2017-02-15 DIAGNOSIS — N400 Enlarged prostate without lower urinary tract symptoms: Secondary | ICD-10-CM | POA: Diagnosis not present

## 2017-02-15 DIAGNOSIS — Z9181 History of falling: Secondary | ICD-10-CM | POA: Diagnosis not present

## 2017-02-15 DIAGNOSIS — Z96642 Presence of left artificial hip joint: Secondary | ICD-10-CM | POA: Diagnosis not present

## 2017-02-15 DIAGNOSIS — Z471 Aftercare following joint replacement surgery: Secondary | ICD-10-CM | POA: Diagnosis not present

## 2017-02-15 DIAGNOSIS — Z96651 Presence of right artificial knee joint: Secondary | ICD-10-CM | POA: Diagnosis not present

## 2017-02-17 DIAGNOSIS — M1711 Unilateral primary osteoarthritis, right knee: Secondary | ICD-10-CM | POA: Diagnosis not present

## 2017-02-17 DIAGNOSIS — Z471 Aftercare following joint replacement surgery: Secondary | ICD-10-CM | POA: Diagnosis not present

## 2017-02-17 DIAGNOSIS — Z96653 Presence of artificial knee joint, bilateral: Secondary | ICD-10-CM | POA: Diagnosis not present

## 2017-02-17 DIAGNOSIS — Z96643 Presence of artificial hip joint, bilateral: Secondary | ICD-10-CM | POA: Diagnosis not present

## 2017-02-18 DIAGNOSIS — Z96642 Presence of left artificial hip joint: Secondary | ICD-10-CM | POA: Diagnosis not present

## 2017-02-18 DIAGNOSIS — I10 Essential (primary) hypertension: Secondary | ICD-10-CM | POA: Diagnosis not present

## 2017-02-18 DIAGNOSIS — Z96652 Presence of left artificial knee joint: Secondary | ICD-10-CM | POA: Diagnosis not present

## 2017-02-18 DIAGNOSIS — N400 Enlarged prostate without lower urinary tract symptoms: Secondary | ICD-10-CM | POA: Diagnosis not present

## 2017-02-18 DIAGNOSIS — Z96651 Presence of right artificial knee joint: Secondary | ICD-10-CM | POA: Diagnosis not present

## 2017-02-18 DIAGNOSIS — Z471 Aftercare following joint replacement surgery: Secondary | ICD-10-CM | POA: Diagnosis not present

## 2017-02-18 DIAGNOSIS — Z9181 History of falling: Secondary | ICD-10-CM | POA: Diagnosis not present

## 2017-02-20 DIAGNOSIS — M25661 Stiffness of right knee, not elsewhere classified: Secondary | ICD-10-CM | POA: Diagnosis not present

## 2017-02-20 DIAGNOSIS — Z96651 Presence of right artificial knee joint: Secondary | ICD-10-CM | POA: Diagnosis not present

## 2017-02-20 DIAGNOSIS — M25561 Pain in right knee: Secondary | ICD-10-CM | POA: Diagnosis not present

## 2017-02-21 DIAGNOSIS — M25561 Pain in right knee: Secondary | ICD-10-CM | POA: Diagnosis not present

## 2017-02-21 DIAGNOSIS — M25661 Stiffness of right knee, not elsewhere classified: Secondary | ICD-10-CM | POA: Diagnosis not present

## 2017-02-21 DIAGNOSIS — Z96651 Presence of right artificial knee joint: Secondary | ICD-10-CM | POA: Diagnosis not present

## 2017-02-28 DIAGNOSIS — M25561 Pain in right knee: Secondary | ICD-10-CM | POA: Diagnosis not present

## 2017-02-28 DIAGNOSIS — M25661 Stiffness of right knee, not elsewhere classified: Secondary | ICD-10-CM | POA: Diagnosis not present

## 2017-02-28 DIAGNOSIS — Z96651 Presence of right artificial knee joint: Secondary | ICD-10-CM | POA: Diagnosis not present

## 2017-03-06 DIAGNOSIS — Z96651 Presence of right artificial knee joint: Secondary | ICD-10-CM | POA: Diagnosis not present

## 2017-03-06 DIAGNOSIS — M25661 Stiffness of right knee, not elsewhere classified: Secondary | ICD-10-CM | POA: Diagnosis not present

## 2017-03-06 DIAGNOSIS — M25561 Pain in right knee: Secondary | ICD-10-CM | POA: Diagnosis not present

## 2017-03-08 DIAGNOSIS — Z96651 Presence of right artificial knee joint: Secondary | ICD-10-CM | POA: Diagnosis not present

## 2017-03-08 DIAGNOSIS — M25561 Pain in right knee: Secondary | ICD-10-CM | POA: Diagnosis not present

## 2017-03-08 DIAGNOSIS — M25661 Stiffness of right knee, not elsewhere classified: Secondary | ICD-10-CM | POA: Diagnosis not present

## 2017-03-10 DIAGNOSIS — M25561 Pain in right knee: Secondary | ICD-10-CM | POA: Diagnosis not present

## 2017-03-13 DIAGNOSIS — M25661 Stiffness of right knee, not elsewhere classified: Secondary | ICD-10-CM | POA: Diagnosis not present

## 2017-03-13 DIAGNOSIS — M25561 Pain in right knee: Secondary | ICD-10-CM | POA: Diagnosis not present

## 2017-03-13 DIAGNOSIS — Z96651 Presence of right artificial knee joint: Secondary | ICD-10-CM | POA: Diagnosis not present

## 2017-03-16 DIAGNOSIS — M25561 Pain in right knee: Secondary | ICD-10-CM | POA: Diagnosis not present

## 2017-03-16 DIAGNOSIS — M25661 Stiffness of right knee, not elsewhere classified: Secondary | ICD-10-CM | POA: Diagnosis not present

## 2017-03-16 DIAGNOSIS — Z96651 Presence of right artificial knee joint: Secondary | ICD-10-CM | POA: Diagnosis not present

## 2017-03-27 DIAGNOSIS — M25561 Pain in right knee: Secondary | ICD-10-CM | POA: Diagnosis not present

## 2017-03-27 DIAGNOSIS — Z96651 Presence of right artificial knee joint: Secondary | ICD-10-CM | POA: Diagnosis not present

## 2017-03-27 DIAGNOSIS — M25661 Stiffness of right knee, not elsewhere classified: Secondary | ICD-10-CM | POA: Diagnosis not present

## 2017-03-29 DIAGNOSIS — Z96651 Presence of right artificial knee joint: Secondary | ICD-10-CM | POA: Diagnosis not present

## 2017-03-29 DIAGNOSIS — M25561 Pain in right knee: Secondary | ICD-10-CM | POA: Diagnosis not present

## 2017-03-29 DIAGNOSIS — M25661 Stiffness of right knee, not elsewhere classified: Secondary | ICD-10-CM | POA: Diagnosis not present

## 2017-04-05 DIAGNOSIS — M25661 Stiffness of right knee, not elsewhere classified: Secondary | ICD-10-CM | POA: Diagnosis not present

## 2017-04-05 DIAGNOSIS — M25561 Pain in right knee: Secondary | ICD-10-CM | POA: Diagnosis not present

## 2017-04-05 DIAGNOSIS — Z96651 Presence of right artificial knee joint: Secondary | ICD-10-CM | POA: Diagnosis not present

## 2017-04-10 DIAGNOSIS — M25561 Pain in right knee: Secondary | ICD-10-CM | POA: Diagnosis not present

## 2017-04-10 DIAGNOSIS — M25661 Stiffness of right knee, not elsewhere classified: Secondary | ICD-10-CM | POA: Diagnosis not present

## 2017-04-10 DIAGNOSIS — Z96651 Presence of right artificial knee joint: Secondary | ICD-10-CM | POA: Diagnosis not present

## 2017-04-12 DIAGNOSIS — Z471 Aftercare following joint replacement surgery: Secondary | ICD-10-CM | POA: Diagnosis not present

## 2017-04-12 DIAGNOSIS — Z96653 Presence of artificial knee joint, bilateral: Secondary | ICD-10-CM | POA: Diagnosis not present

## 2017-04-12 DIAGNOSIS — M25561 Pain in right knee: Secondary | ICD-10-CM | POA: Diagnosis not present

## 2017-04-13 DIAGNOSIS — M25661 Stiffness of right knee, not elsewhere classified: Secondary | ICD-10-CM | POA: Diagnosis not present

## 2017-04-13 DIAGNOSIS — Z96651 Presence of right artificial knee joint: Secondary | ICD-10-CM | POA: Diagnosis not present

## 2017-04-13 DIAGNOSIS — M25561 Pain in right knee: Secondary | ICD-10-CM | POA: Diagnosis not present

## 2017-04-14 DIAGNOSIS — M19012 Primary osteoarthritis, left shoulder: Secondary | ICD-10-CM | POA: Diagnosis not present

## 2017-04-27 ENCOUNTER — Other Ambulatory Visit: Payer: Self-pay | Admitting: Orthopedic Surgery

## 2017-05-01 ENCOUNTER — Other Ambulatory Visit: Payer: Self-pay | Admitting: Orthopedic Surgery

## 2017-05-01 DIAGNOSIS — M19012 Primary osteoarthritis, left shoulder: Secondary | ICD-10-CM

## 2017-05-02 ENCOUNTER — Ambulatory Visit
Admission: RE | Admit: 2017-05-02 | Discharge: 2017-05-02 | Disposition: A | Discharge status: Home / Self Care | Payer: Medicare Other | Source: Ambulatory Visit | Attending: Orthopedic Surgery | Admitting: Orthopedic Surgery

## 2017-05-02 DIAGNOSIS — M19012 Primary osteoarthritis, left shoulder: Secondary | ICD-10-CM | POA: Diagnosis not present

## 2017-05-03 DIAGNOSIS — Z0001 Encounter for general adult medical examination with abnormal findings: Secondary | ICD-10-CM | POA: Diagnosis not present

## 2017-05-03 DIAGNOSIS — R1013 Epigastric pain: Secondary | ICD-10-CM | POA: Diagnosis not present

## 2017-05-03 DIAGNOSIS — I10 Essential (primary) hypertension: Secondary | ICD-10-CM | POA: Diagnosis not present

## 2017-05-03 DIAGNOSIS — R739 Hyperglycemia, unspecified: Secondary | ICD-10-CM | POA: Diagnosis not present

## 2017-05-04 DIAGNOSIS — M19012 Primary osteoarthritis, left shoulder: Secondary | ICD-10-CM | POA: Diagnosis not present

## 2017-05-04 DIAGNOSIS — M25512 Pain in left shoulder: Secondary | ICD-10-CM | POA: Diagnosis not present

## 2017-05-04 DIAGNOSIS — I10 Essential (primary) hypertension: Secondary | ICD-10-CM | POA: Diagnosis not present

## 2017-05-04 DIAGNOSIS — R1013 Epigastric pain: Secondary | ICD-10-CM | POA: Diagnosis not present

## 2017-05-04 DIAGNOSIS — R739 Hyperglycemia, unspecified: Secondary | ICD-10-CM | POA: Diagnosis not present

## 2017-05-04 DIAGNOSIS — Z125 Encounter for screening for malignant neoplasm of prostate: Secondary | ICD-10-CM | POA: Diagnosis not present

## 2017-05-10 DIAGNOSIS — M25561 Pain in right knee: Secondary | ICD-10-CM | POA: Diagnosis not present

## 2017-05-16 ENCOUNTER — Other Ambulatory Visit: Payer: Self-pay

## 2017-05-16 ENCOUNTER — Encounter (HOSPITAL_COMMUNITY)
Admission: RE | Admit: 2017-05-16 | Discharge: 2017-05-16 | Disposition: A | Discharge status: Home / Self Care | Payer: Medicare Other | Source: Ambulatory Visit | Attending: Orthopedic Surgery | Admitting: Orthopedic Surgery

## 2017-05-16 ENCOUNTER — Encounter (HOSPITAL_COMMUNITY): Payer: Self-pay

## 2017-05-16 ENCOUNTER — Ambulatory Visit (HOSPITAL_COMMUNITY)
Admission: RE | Admit: 2017-05-16 | Discharge: 2017-05-16 | Disposition: A | Discharge status: Home / Self Care | Payer: Medicare Other | Source: Ambulatory Visit | Attending: Orthopedic Surgery | Admitting: Orthopedic Surgery

## 2017-05-16 DIAGNOSIS — K219 Gastro-esophageal reflux disease without esophagitis: Secondary | ICD-10-CM | POA: Diagnosis not present

## 2017-05-16 DIAGNOSIS — Z7951 Long term (current) use of inhaled steroids: Secondary | ICD-10-CM | POA: Diagnosis not present

## 2017-05-16 DIAGNOSIS — I10 Essential (primary) hypertension: Secondary | ICD-10-CM | POA: Insufficient documentation

## 2017-05-16 DIAGNOSIS — Z96653 Presence of artificial knee joint, bilateral: Secondary | ICD-10-CM

## 2017-05-16 DIAGNOSIS — N400 Enlarged prostate without lower urinary tract symptoms: Secondary | ICD-10-CM

## 2017-05-16 DIAGNOSIS — Z01818 Encounter for other preprocedural examination: Secondary | ICD-10-CM

## 2017-05-16 DIAGNOSIS — Z7982 Long term (current) use of aspirin: Secondary | ICD-10-CM

## 2017-05-16 DIAGNOSIS — Z87891 Personal history of nicotine dependence: Secondary | ICD-10-CM

## 2017-05-16 DIAGNOSIS — Z6831 Body mass index (BMI) 31.0-31.9, adult: Secondary | ICD-10-CM | POA: Insufficient documentation

## 2017-05-16 DIAGNOSIS — E669 Obesity, unspecified: Secondary | ICD-10-CM | POA: Insufficient documentation

## 2017-05-16 DIAGNOSIS — Z471 Aftercare following joint replacement surgery: Secondary | ICD-10-CM | POA: Diagnosis not present

## 2017-05-16 DIAGNOSIS — Z9889 Other specified postprocedural states: Secondary | ICD-10-CM | POA: Insufficient documentation

## 2017-05-16 DIAGNOSIS — Z79899 Other long term (current) drug therapy: Secondary | ICD-10-CM

## 2017-05-16 DIAGNOSIS — M1990 Unspecified osteoarthritis, unspecified site: Secondary | ICD-10-CM | POA: Insufficient documentation

## 2017-05-16 DIAGNOSIS — Z96643 Presence of artificial hip joint, bilateral: Secondary | ICD-10-CM

## 2017-05-16 DIAGNOSIS — Z96612 Presence of left artificial shoulder joint: Secondary | ICD-10-CM | POA: Diagnosis not present

## 2017-05-16 DIAGNOSIS — M19012 Primary osteoarthritis, left shoulder: Secondary | ICD-10-CM | POA: Diagnosis not present

## 2017-05-16 LAB — CBC WITH DIFFERENTIAL/PLATELET
BASOS ABS: 0 10*3/uL (ref 0.0–0.1)
Basophils Relative: 0 %
EOS ABS: 0.1 10*3/uL (ref 0.0–0.7)
EOS PCT: 3 %
HCT: 41.8 % (ref 39.0–52.0)
Hemoglobin: 13.7 g/dL (ref 13.0–17.0)
LYMPHS ABS: 1.3 10*3/uL (ref 0.7–4.0)
LYMPHS PCT: 25 %
MCH: 29 pg (ref 26.0–34.0)
MCHC: 32.8 g/dL (ref 30.0–36.0)
MCV: 88.4 fL (ref 78.0–100.0)
MONO ABS: 0.6 10*3/uL (ref 0.1–1.0)
Monocytes Relative: 12 %
Neutro Abs: 3.1 10*3/uL (ref 1.7–7.7)
Neutrophils Relative %: 60 %
PLATELETS: 234 10*3/uL (ref 150–400)
RBC: 4.73 MIL/uL (ref 4.22–5.81)
RDW: 13.6 % (ref 11.5–15.5)
WBC: 5.1 10*3/uL (ref 4.0–10.5)

## 2017-05-16 LAB — URINALYSIS, ROUTINE W REFLEX MICROSCOPIC
Bilirubin Urine: NEGATIVE
GLUCOSE, UA: NEGATIVE mg/dL
HGB URINE DIPSTICK: NEGATIVE
KETONES UR: NEGATIVE mg/dL
Leukocytes, UA: NEGATIVE
Nitrite: NEGATIVE
PROTEIN: NEGATIVE mg/dL
Specific Gravity, Urine: 1.011 (ref 1.005–1.030)
pH: 7 (ref 5.0–8.0)

## 2017-05-16 LAB — COMPREHENSIVE METABOLIC PANEL
ALT: 19 U/L (ref 17–63)
AST: 23 U/L (ref 15–41)
Albumin: 3.7 g/dL (ref 3.5–5.0)
Alkaline Phosphatase: 96 U/L (ref 38–126)
Anion gap: 13 (ref 5–15)
BUN: 11 mg/dL (ref 6–20)
CHLORIDE: 103 mmol/L (ref 101–111)
CO2: 24 mmol/L (ref 22–32)
Calcium: 9.4 mg/dL (ref 8.9–10.3)
Creatinine, Ser: 0.99 mg/dL (ref 0.61–1.24)
Glucose, Bld: 103 mg/dL — ABNORMAL HIGH (ref 65–99)
Potassium: 4.1 mmol/L (ref 3.5–5.1)
Sodium: 140 mmol/L (ref 135–145)
Total Bilirubin: 0.7 mg/dL (ref 0.3–1.2)
Total Protein: 7.1 g/dL (ref 6.5–8.1)

## 2017-05-16 LAB — SURGICAL PCR SCREEN
MRSA, PCR: NEGATIVE
Staphylococcus aureus: NEGATIVE

## 2017-05-16 LAB — TYPE AND SCREEN
ABO/RH(D): AB POS
Antibody Screen: NEGATIVE

## 2017-05-16 LAB — APTT: APTT: 28 s (ref 24–36)

## 2017-05-16 LAB — PROTIME-INR
INR: 1.67
PROTHROMBIN TIME: 19.5 s — AB (ref 11.4–15.2)

## 2017-05-16 NOTE — Pre-Procedure Instructions (Signed)
Raymond Graham  05/16/2017      [facility] - [place] - [address] [place] [id number] Phone: [phone] Fax: [phone]    Your procedure is scheduled on  Thursday 05/18/17  [facility]  Call this number if you have problems the morning of surgery:  [phone]   Remember:  Do not eat food or drink liquids after midnight.  Take these medicines the morning of surgery with A SIP OF WATER - CLONIDINE (CATAPRES), GABAPENTIN, METOPROLOL (TOPROL), OMEPRAZOLE (PRILOSEC), TAMSULOSIN (FLOMAX), TYLENOL OR HYDROCODONE IF NEEDED  7 days prior to surgery STOP taking any Aspirin(unless otherwise instructed by your surgeon), Aleve, Naproxen, Ibuprofen, Motrin, Advil, Goody's, BC's, all herbal medications, fish oil, and all vitamins    Do not wear jewelry, make-up or nail polish.  Do not wear lotions, powders, or perfumes, or deodorant.  Do not shave 48 hours prior to surgery.  Men may shave face and neck.  Do not bring valuables to the hospital.  [facility] is not responsible for any belongings or valuables.  Contacts, dentures or bridgework may not be worn into surgery.  Leave your suitcase in the car.  After surgery it may be brought to your room.  For patients admitted to the hospital, discharge time will be determined by your treatment team.  Patients discharged the day of surgery will not be allowed to drive home.   Name and phone number of your driver:    Special instructions:  [place] - Preparing for Surgery  Before surgery, you can play an important role.  Because skin is not sterile, your skin needs to be as free of germs as possible.  You can reduce the number of germs on you skin by washing with CHG (chlorahexidine gluconate) soap before surgery.  CHG is an antiseptic cleaner which kills germs and bonds with the skin to continue killing germs even after  washing.  Please DO NOT use if you have an allergy to CHG or antibacterial soaps.  If your skin becomes reddened/irritated stop using the CHG and inform your nurse when you arrive at Short Stay.  Do not shave (including legs and underarms) for at least 48 hours prior to the first CHG shower.  You may shave your face.  Please follow these instructions carefully:   1.  Shower with CHG Soap the night before surgery and the                                morning of Surgery.  2.  If you choose to wash your hair, wash your hair first as usual with your       normal shampoo.  3.  After you shampoo, rinse your hair and body thoroughly to remove the                      Shampoo.  4.  Use CHG as you would any other liquid soap.  You can apply chg directly       to the skin and wash gently with scrungie or a clean washcloth.  5.  Apply the CHG Soap to your body ONLY FROM THE NECK DOWN.        Do not use on open wounds or open sores.  Avoid contact with your eyes,  ears, mouth and genitals (private parts).  Wash genitals (private parts)       with your normal soap.  6.  Wash thoroughly, paying special attention to the area where your surgery        will be performed.  7.  Thoroughly rinse your body with warm water from the neck down.  8.  DO NOT shower/wash with your normal soap after using and rinsing off       the CHG Soap.  9.  Pat yourself dry with a clean towel.            10.  Wear clean pajamas.            11.  Place clean sheets on your bed the night of your first shower and do not        sleep with pets.  Day of Surgery  Do not apply any lotions/deoderants the morning of surgery.  Please wear clean clothes to the hospital/surgery center.    Please read over the following fact sheets that you were given. MRSA Information and Surgical Site Infection Prevention

## 2017-05-17 NOTE — Progress Notes (Signed)
Anesthesia Chart Review: Patient is a 67 year old male scheduled for left total shoulder arthroplasty on 05/18/17 by Dr. Jones BroomJustin Chandler.  History includes former smoker (quit '93), HTN, BPH, GERD, arthritis, left THA 05/31/11 (general anesthesia), right THA 06/10/03, left TKA 11/21/11 (spinal anesthesia), L3-S1 foraminotomies, exploratory lap with repair of pyloric ulcer 07/23/16, right TKA 02/07/17 (spinal). BMI is consistent with obesity.  PCP is listed as Dr. Quitman LivingsSami Hassan [facility] in [place]). Patient is not routinely followed by a cardiologist, but he was referred to Dr. Yates DecampJay Ganji in 2012 prior to undergoing 2013 hip and knee replacement surgeries and had a negative exercise stress test.   Meds include ASA 325 mg (not currently taking), clonidine, Flonase, Neurontin, HCTZ, Salonpas patch, Toprol XL, Prilosec, Protonix, Flomax, trazodone.  BP (!) 161/80   Pulse 63   Temp 36.7 C   Resp 20   Ht 5\' 8"  (1.727 m)   Wt 207 lb 4.8 oz (94 kg)   SpO2 100%   BMI 31.52 kg/m   EKG 07/22/16: SB at 58 bpm.   ETT 07/07/10 [facility]; scanned under Media tab, Correspondence 06/03/11): Negative. EKG shows NSR. Stress EKG negative for ischemia. Hypertensive BP response. No exercise induced arrhythmias. Exercise tolerance is above average, achieved 12.5 METS. Recommend continue primary prevention.  CXR 05/16/17: IMPRESSION: No active cardiopulmonary disease.  Preoperative labs noted. Cr 0.99, glucose 103. CBC WNL. PT 19.5, INR 1.67. PTT 28. T&S done. In review of previous PT, he had a result of 19.9 on 05/31/11 with repeat of 20.5 prior to left THA; 19.8 with repeat of 19.3 prior to left TKA 11/21/11 (had spinal anesthesia), PT 19.5 with repeat 20.5 prior to right TKA 02/07/17 (had spinal anesthesia). No reported bleeding issues. He is not currently taking warfarin, NOAC, or ASA.  Historically, patient has had a chronically elevated PT/INR since 2013. Unclear etiology--he is not on warfarin.  This was previously discussed with anesthesia 01/2017. At this point, I have not placed an order to repeat PT/INR since multiple lab results are consistent with 05/16/16 results. I did leave a message for Baird Lyonsasey at Dr. Veda Canninghandler's office regarding PT/INR results. Will defer any additional pre-operative labs to surgeon.   Velna Ochsllison Angellina Ferdinand, PA-C [facility]esthesiology Phone [phone] 05/17/2017 12:25 PM

## 2017-05-17 NOTE — Anesthesia Preprocedure Evaluation (Addendum)
Anesthesia Evaluation  Patient identified by MRN, date of birth, ID band Patient awake    Reviewed: Allergy & Precautions, H&P , NPO status , Patient's Chart, lab work & pertinent test results, reviewed documented beta blocker date and time   Airway Mallampati: II  TM Distance: >3 FB Neck ROM: Full    Dental no notable dental hx. (+) Dental Advisory Given, Teeth Intact   Pulmonary former smoker,    Pulmonary exam normal breath sounds clear to auscultation       Cardiovascular Exercise Tolerance: Good hypertension, Pt. on medications and Pt. on home beta blockers Normal cardiovascular exam Rhythm:Regular Rate:Normal     Neuro/Psych negative neurological ROS  negative psych ROS   GI/Hepatic Neg liver ROS, PUD, GERD  Medicated,  Endo/Other  Obesity  Renal/GU negative Renal ROS  negative genitourinary   Musculoskeletal  (+) Arthritis ,   Abdominal   Peds  Hematology negative hematology ROS (+)   Anesthesia Other Findings   Reproductive/Obstetrics                            Anesthesia Physical  Anesthesia Plan  ASA: III  Anesthesia Plan: General   Post-op Pain Management:  Regional for Post-op pain   Induction: Intravenous  PONV Risk Score and Plan: 2 and Ondansetron, Dexamethasone and Treatment may vary due to age or medical condition  Airway Management Planned: Oral ETT  Additional Equipment: None  Intra-op Plan:   Post-operative Plan: Extubation in OR  Informed Consent: I have reviewed the patients History and Physical, chart, labs and discussed the procedure including the risks, benefits and alternatives for the proposed anesthesia with the patient or authorized representative who has indicated his/her understanding and acceptance.   Dental Advisory Given  Plan Discussed with: CRNA  Anesthesia Plan Comments: (  )        Anesthesia Quick Evaluation

## 2017-05-18 ENCOUNTER — Inpatient Hospital Stay (HOSPITAL_COMMUNITY): Payer: Medicare Other

## 2017-05-18 ENCOUNTER — Other Ambulatory Visit: Payer: Self-pay

## 2017-05-18 ENCOUNTER — Inpatient Hospital Stay (HOSPITAL_COMMUNITY): Payer: Medicare Other | Admitting: Certified Registered"

## 2017-05-18 ENCOUNTER — Encounter (HOSPITAL_COMMUNITY)
Admission: RE | Disposition: A | Discharge status: Home / Self Care | Payer: Self-pay | Source: Ambulatory Visit | Attending: Orthopedic Surgery

## 2017-05-18 ENCOUNTER — Encounter (HOSPITAL_COMMUNITY): Payer: Self-pay | Admitting: *Deleted

## 2017-05-18 ENCOUNTER — Inpatient Hospital Stay (HOSPITAL_COMMUNITY)
Admission: RE | Admit: 2017-05-18 | Discharge: 2017-05-19 | DRG: 483 | Disposition: A | Discharge status: Home / Self Care | Payer: Medicare Other | Source: Ambulatory Visit | Attending: Orthopedic Surgery | Admitting: Orthopedic Surgery

## 2017-05-18 DIAGNOSIS — Z7951 Long term (current) use of inhaled steroids: Secondary | ICD-10-CM

## 2017-05-18 DIAGNOSIS — I10 Essential (primary) hypertension: Secondary | ICD-10-CM | POA: Diagnosis present

## 2017-05-18 DIAGNOSIS — Z96643 Presence of artificial hip joint, bilateral: Secondary | ICD-10-CM | POA: Diagnosis present

## 2017-05-18 DIAGNOSIS — G8918 Other acute postprocedural pain: Secondary | ICD-10-CM | POA: Diagnosis not present

## 2017-05-18 DIAGNOSIS — Z7982 Long term (current) use of aspirin: Secondary | ICD-10-CM

## 2017-05-18 DIAGNOSIS — M19012 Primary osteoarthritis, left shoulder: Secondary | ICD-10-CM | POA: Diagnosis not present

## 2017-05-18 DIAGNOSIS — Z87891 Personal history of nicotine dependence: Secondary | ICD-10-CM

## 2017-05-18 DIAGNOSIS — K219 Gastro-esophageal reflux disease without esophagitis: Secondary | ICD-10-CM | POA: Diagnosis present

## 2017-05-18 DIAGNOSIS — Z96612 Presence of left artificial shoulder joint: Secondary | ICD-10-CM | POA: Diagnosis not present

## 2017-05-18 DIAGNOSIS — Z471 Aftercare following joint replacement surgery: Secondary | ICD-10-CM | POA: Diagnosis not present

## 2017-05-18 DIAGNOSIS — N400 Enlarged prostate without lower urinary tract symptoms: Secondary | ICD-10-CM | POA: Diagnosis present

## 2017-05-18 DIAGNOSIS — Z96653 Presence of artificial knee joint, bilateral: Secondary | ICD-10-CM | POA: Diagnosis present

## 2017-05-18 DIAGNOSIS — M25512 Pain in left shoulder: Secondary | ICD-10-CM | POA: Diagnosis present

## 2017-05-18 HISTORY — PX: TOTAL SHOULDER ARTHROPLASTY: SHX126

## 2017-05-18 SURGERY — ARTHROPLASTY, SHOULDER, TOTAL
Anesthesia: General | Laterality: Left

## 2017-05-18 MED ORDER — FENTANYL CITRATE (PF) 250 MCG/5ML IJ SOLN
INTRAMUSCULAR | Status: AC
  Filled 2017-05-18: qty 5

## 2017-05-18 MED ORDER — DEXAMETHASONE SODIUM PHOSPHATE 10 MG/ML IJ SOLN
INTRAMUSCULAR | Status: AC
  Filled 2017-05-18: qty 1

## 2017-05-18 MED ORDER — ACETAMINOPHEN 325 MG PO TABS
650.0000 mg | ORAL_TABLET | ORAL | Status: DC | PRN
Start: 2017-05-18 — End: 2017-05-19

## 2017-05-18 MED ORDER — OXYCODONE HCL 5 MG PO TABS: 5.0000 mg | ORAL_TABLET | ORAL | Status: DC | PRN

## 2017-05-18 MED ORDER — POVIDONE-IODINE 7.5 % EX SOLN
Freq: Once | CUTANEOUS | Status: DC
  Filled 2017-05-18: qty 118

## 2017-05-18 MED ORDER — ALUM & MAG HYDROXIDE-SIMETH 200-200-20 MG/5ML PO SUSP: 30.0000 mL | ORAL | Status: DC | PRN

## 2017-05-18 MED ORDER — METOPROLOL SUCCINATE ER 100 MG PO TB24
200.0000 mg | ORAL_TABLET | Freq: Every day | ORAL | Status: DC
  Administered 2017-05-19: 200 mg via ORAL
  Filled 2017-05-18: qty 2

## 2017-05-18 MED ORDER — ONDANSETRON HCL 4 MG/2ML IJ SOLN: 4.0000 mg | Freq: Once | INTRAMUSCULAR | Status: DC | PRN

## 2017-05-18 MED ORDER — OXYCODONE HCL 5 MG PO TABS
10.0000 mg | ORAL_TABLET | ORAL | Status: DC | PRN
Start: 2017-05-18 — End: 2017-05-19
  Administered 2017-05-18 – 2017-05-19 (×3): 10 mg via ORAL
  Filled 2017-05-18 (×3): qty 2

## 2017-05-18 MED ORDER — FENTANYL CITRATE (PF) 250 MCG/5ML IJ SOLN
INTRAMUSCULAR | Status: DC | PRN
  Administered 2017-05-18 (×2): 50 ug via INTRAVENOUS

## 2017-05-18 MED ORDER — GLYCOPYRROLATE 0.2 MG/ML IJ SOLN
INTRAMUSCULAR | Status: DC | PRN
  Administered 2017-05-18 (×2): .2 mg via INTRAVENOUS

## 2017-05-18 MED ORDER — MIDAZOLAM HCL 5 MG/5ML IJ SOLN
INTRAMUSCULAR | Status: DC | PRN
  Administered 2017-05-18: 2 mg via INTRAVENOUS

## 2017-05-18 MED ORDER — ACETAMINOPHEN 650 MG RE SUPP: 650.0000 mg | RECTAL | Status: DC | PRN

## 2017-05-18 MED ORDER — DEXAMETHASONE SODIUM PHOSPHATE 10 MG/ML IJ SOLN
INTRAMUSCULAR | Status: DC | PRN
  Administered 2017-05-18: 5 mg via INTRAVENOUS

## 2017-05-18 MED ORDER — 0.9 % SODIUM CHLORIDE (POUR BTL) OPTIME
TOPICAL | Status: DC | PRN
  Administered 2017-05-18: 1000 mL

## 2017-05-18 MED ORDER — OXYCODONE HCL 5 MG/5ML PO SOLN: 5.0000 mg | Freq: Once | ORAL | Status: DC | PRN

## 2017-05-18 MED ORDER — TRAZODONE HCL 50 MG PO TABS
150.0000 mg | ORAL_TABLET | Freq: Every evening | ORAL | Status: DC | PRN
  Filled 2017-05-18: qty 1

## 2017-05-18 MED ORDER — MORPHINE SULFATE (PF) 2 MG/ML IV SOLN
1.0000 mg | INTRAVENOUS | Status: DC | PRN
  Administered 2017-05-18: 2 mg via INTRAVENOUS
  Filled 2017-05-18: qty 1

## 2017-05-18 MED ORDER — DIPHENHYDRAMINE HCL 12.5 MG/5ML PO ELIX: 12.5000 mg | ORAL_SOLUTION | ORAL | Status: DC | PRN

## 2017-05-18 MED ORDER — EPHEDRINE SULFATE-NACL 50-0.9 MG/10ML-% IV SOSY
PREFILLED_SYRINGE | INTRAVENOUS | Status: DC | PRN
  Administered 2017-05-18: 10 mg via INTRAVENOUS
  Administered 2017-05-18: 5 mg via INTRAVENOUS

## 2017-05-18 MED ORDER — TAMSULOSIN HCL 0.4 MG PO CAPS
0.4000 mg | ORAL_CAPSULE | Freq: Every day | ORAL | Status: DC
  Administered 2017-05-19: 0.4 mg via ORAL
  Filled 2017-05-18: qty 1

## 2017-05-18 MED ORDER — TRANEXAMIC ACID 1000 MG/10ML IV SOLN
1000.0000 mg | INTRAVENOUS | Status: AC
  Administered 2017-05-18: 1000 mg via INTRAVENOUS
  Filled 2017-05-18: qty 1100

## 2017-05-18 MED ORDER — ROCURONIUM BROMIDE 10 MG/ML (PF) SYRINGE
PREFILLED_SYRINGE | INTRAVENOUS | Status: AC
  Filled 2017-05-18: qty 5

## 2017-05-18 MED ORDER — PHENOL 1.4 % MT LIQD: 1.0000 | OROMUCOSAL | Status: DC | PRN

## 2017-05-18 MED ORDER — LIDOCAINE 2% (20 MG/ML) 5 ML SYRINGE
INTRAMUSCULAR | Status: AC
  Filled 2017-05-18: qty 5

## 2017-05-18 MED ORDER — METOCLOPRAMIDE HCL 5 MG/ML IJ SOLN: 5.0000 mg | Freq: Three times a day (TID) | INTRAMUSCULAR | Status: DC | PRN

## 2017-05-18 MED ORDER — BUPIVACAINE-EPINEPHRINE (PF) 0.5% -1:200000 IJ SOLN
INTRAMUSCULAR | Status: DC | PRN
  Administered 2017-05-18: 30 mL via PERINEURAL

## 2017-05-18 MED ORDER — ROCURONIUM BROMIDE 10 MG/ML (PF) SYRINGE
PREFILLED_SYRINGE | INTRAVENOUS | Status: DC | PRN
  Administered 2017-05-18: 20 mg via INTRAVENOUS
  Administered 2017-05-18: 50 mg via INTRAVENOUS
  Administered 2017-05-18: 30 mg via INTRAVENOUS

## 2017-05-18 MED ORDER — PROPOFOL 10 MG/ML IV BOLUS
INTRAVENOUS | Status: DC | PRN
  Administered 2017-05-18: 200 mg via INTRAVENOUS

## 2017-05-18 MED ORDER — ONDANSETRON HCL 4 MG/2ML IJ SOLN
INTRAMUSCULAR | Status: DC | PRN
  Administered 2017-05-18: 4 mg via INTRAVENOUS

## 2017-05-18 MED ORDER — FLEET ENEMA 7-19 GM/118ML RE ENEM: 1.0000 | ENEMA | Freq: Once | RECTAL | Status: DC | PRN

## 2017-05-18 MED ORDER — OXYCODONE HCL 5 MG PO TABS: 5.0000 mg | ORAL_TABLET | Freq: Once | ORAL | Status: DC | PRN

## 2017-05-18 MED ORDER — FENTANYL CITRATE (PF) 100 MCG/2ML IJ SOLN: 25.0000 ug | INTRAMUSCULAR | Status: DC | PRN

## 2017-05-18 MED ORDER — METOCLOPRAMIDE HCL 5 MG PO TABS
5.0000 mg | ORAL_TABLET | Freq: Three times a day (TID) | ORAL | Status: DC | PRN
Start: 2017-05-18 — End: 2017-05-19

## 2017-05-18 MED ORDER — DOCUSATE SODIUM 100 MG PO CAPS
100.0000 mg | ORAL_CAPSULE | Freq: Two times a day (BID) | ORAL | Status: DC
  Administered 2017-05-18 – 2017-05-19 (×3): 100 mg via ORAL
  Filled 2017-05-18 (×2): qty 1

## 2017-05-18 MED ORDER — CEFAZOLIN SODIUM-DEXTROSE 2-4 GM/100ML-% IV SOLN
2.0000 g | INTRAVENOUS | Status: AC
  Administered 2017-05-18: 2 g via INTRAVENOUS
  Filled 2017-05-18: qty 100

## 2017-05-18 MED ORDER — MIDAZOLAM HCL 2 MG/2ML IJ SOLN
INTRAMUSCULAR | Status: AC
  Filled 2017-05-18: qty 2

## 2017-05-18 MED ORDER — SUGAMMADEX SODIUM 200 MG/2ML IV SOLN
INTRAVENOUS | Status: DC | PRN
  Administered 2017-05-18: 188 mg via INTRAVENOUS

## 2017-05-18 MED ORDER — ASPIRIN EC 325 MG PO TBEC
325.0000 mg | DELAYED_RELEASE_TABLET | Freq: Every day | ORAL | Status: DC
  Administered 2017-05-19: 325 mg via ORAL
  Filled 2017-05-18: qty 1

## 2017-05-18 MED ORDER — PANTOPRAZOLE SODIUM 40 MG PO TBEC
40.0000 mg | DELAYED_RELEASE_TABLET | Freq: Every day | ORAL | Status: DC
  Administered 2017-05-19: 40 mg via ORAL
  Filled 2017-05-18: qty 1

## 2017-05-18 MED ORDER — LACTATED RINGERS IV SOLN
INTRAVENOUS | Status: DC | PRN
  Administered 2017-05-18 (×2): via INTRAVENOUS

## 2017-05-18 MED ORDER — CLONIDINE HCL 0.1 MG PO TABS
0.1000 mg | ORAL_TABLET | Freq: Two times a day (BID) | ORAL | Status: DC
  Administered 2017-05-18 – 2017-05-19 (×2): 0.1 mg via ORAL
  Filled 2017-05-18 (×2): qty 1

## 2017-05-18 MED ORDER — ONDANSETRON HCL 4 MG/2ML IJ SOLN
INTRAMUSCULAR | Status: AC
  Filled 2017-05-18: qty 2

## 2017-05-18 MED ORDER — PHENYLEPHRINE HCL 10 MG/ML IJ SOLN
INTRAVENOUS | Status: DC | PRN
  Administered 2017-05-18: 20 ug/min via INTRAVENOUS

## 2017-05-18 MED ORDER — TRANEXAMIC ACID 1000 MG/10ML IV SOLN
2000.0000 mg | Freq: Once | INTRAVENOUS | Status: AC
  Administered 2017-05-18: 2000 mg via TOPICAL

## 2017-05-18 MED ORDER — ZOLPIDEM TARTRATE 5 MG PO TABS
5.0000 mg | ORAL_TABLET | Freq: Every evening | ORAL | Status: DC | PRN
  Filled 2017-05-18: qty 1

## 2017-05-18 MED ORDER — HYDROCHLOROTHIAZIDE 25 MG PO TABS
25.0000 mg | ORAL_TABLET | Freq: Every day | ORAL | Status: DC
  Administered 2017-05-18 – 2017-05-19 (×2): 25 mg via ORAL
  Filled 2017-05-18 (×2): qty 1

## 2017-05-18 MED ORDER — CEFAZOLIN SODIUM-DEXTROSE 2-4 GM/100ML-% IV SOLN
2.0000 g | Freq: Four times a day (QID) | INTRAVENOUS | Status: AC
  Administered 2017-05-18 – 2017-05-19 (×3): 2 g via INTRAVENOUS
  Filled 2017-05-18 (×3): qty 100

## 2017-05-18 MED ORDER — LIDOCAINE 2% (20 MG/ML) 5 ML SYRINGE
INTRAMUSCULAR | Status: DC | PRN
  Administered 2017-05-18: 40 mg via INTRAVENOUS

## 2017-05-18 MED ORDER — TRAZODONE HCL 150 MG PO TABS
150.0000 mg | ORAL_TABLET | Freq: Every evening | ORAL | Status: DC | PRN
  Administered 2017-05-18: 150 mg via ORAL
  Filled 2017-05-18 (×2): qty 1

## 2017-05-18 MED ORDER — MENTHOL 3 MG MT LOZG: 1.0000 | LOZENGE | OROMUCOSAL | Status: DC | PRN

## 2017-05-18 MED ORDER — ACETAMINOPHEN 500 MG PO TABS
1000.0000 mg | ORAL_TABLET | Freq: Four times a day (QID) | ORAL | Status: AC
  Administered 2017-05-18 – 2017-05-19 (×4): 1000 mg via ORAL
  Filled 2017-05-18 (×4): qty 2

## 2017-05-18 MED ORDER — ONDANSETRON HCL 4 MG PO TABS: 4.0000 mg | ORAL_TABLET | Freq: Four times a day (QID) | ORAL | Status: DC | PRN

## 2017-05-18 MED ORDER — ONDANSETRON HCL 4 MG/2ML IJ SOLN: 4.0000 mg | Freq: Four times a day (QID) | INTRAMUSCULAR | Status: DC | PRN

## 2017-05-18 MED ORDER — POLYETHYLENE GLYCOL 3350 17 G PO PACK: 17.0000 g | PACK | Freq: Every day | ORAL | Status: DC | PRN

## 2017-05-18 MED ORDER — GABAPENTIN 100 MG PO CAPS
100.0000 mg | ORAL_CAPSULE | Freq: Three times a day (TID) | ORAL | Status: DC
  Administered 2017-05-18 – 2017-05-19 (×3): 100 mg via ORAL
  Filled 2017-05-18 (×3): qty 1

## 2017-05-18 MED ORDER — BISACODYL 5 MG PO TBEC: 5.0000 mg | DELAYED_RELEASE_TABLET | Freq: Every day | ORAL | Status: DC | PRN

## 2017-05-18 MED ORDER — EPHEDRINE 5 MG/ML INJ
INTRAVENOUS | Status: AC
  Filled 2017-05-18: qty 10

## 2017-05-18 MED ORDER — PROPOFOL 10 MG/ML IV BOLUS
INTRAVENOUS | Status: AC
  Filled 2017-05-18: qty 20

## 2017-05-18 MED ORDER — SODIUM CHLORIDE 0.9 % IR SOLN
Status: DC | PRN
  Administered 2017-05-18: 3000 mL

## 2017-05-18 MED ORDER — SUGAMMADEX SODIUM 200 MG/2ML IV SOLN
INTRAVENOUS | Status: AC
  Filled 2017-05-18: qty 2

## 2017-05-18 MED ORDER — SODIUM CHLORIDE 0.9 % IV SOLN
INTRAVENOUS | Status: DC
  Administered 2017-05-18 – 2017-05-19 (×3): via INTRAVENOUS

## 2017-05-18 MED ORDER — TRANEXAMIC ACID 1000 MG/10ML IV SOLN
2000.0000 mg | Freq: Once | INTRAVENOUS | Status: DC
  Filled 2017-05-18: qty 20

## 2017-05-18 SURGICAL SUPPLY — 68 items
AID PSTN UNV HD RSTRNT DISP (MISCELLANEOUS) ×1
BAG DECANTER FOR FLEXI CONT (MISCELLANEOUS) ×2 IMPLANT
BIT DRILL 5/64X5 DISP (BIT) ×3 IMPLANT
BLADE SAW SAG 73X25 THK (BLADE) ×2
BLADE SAW SGTL 73X25 THK (BLADE) ×1 IMPLANT
BLADE SURG 15 STRL LF DISP TIS (BLADE) ×1 IMPLANT
BLADE SURG 15 STRL SS (BLADE) ×3
CAP SHOULDER TOTAL 2 ×2 IMPLANT
CEMENT BONE DEPUY (Cement) ×2 IMPLANT
CHLORAPREP W/TINT 26ML (MISCELLANEOUS) ×5 IMPLANT
CLOSURE WOUND 1/2 X4 (GAUZE/BANDAGES/DRESSINGS) ×1
COVER SURGICAL LIGHT HANDLE (MISCELLANEOUS) ×3 IMPLANT
DRAPE INCISE IOBAN 66X45 STRL (DRAPES) ×3 IMPLANT
DRAPE ORTHO SPLIT 77X108 STRL (DRAPES) ×6
DRAPE SURG 17X23 STRL (DRAPES) ×3 IMPLANT
DRAPE SURG ORHT 6 SPLT 77X108 (DRAPES) ×2 IMPLANT
DRAPE U-SHAPE 47X51 STRL (DRAPES) ×3 IMPLANT
DRSG AQUACEL AG ADV 3.5X10 (GAUZE/BANDAGES/DRESSINGS) ×2 IMPLANT
ELECT BLADE 4.0 EZ CLEAN MEGAD (MISCELLANEOUS)
ELECT REM PT RETURN 9FT ADLT (ELECTROSURGICAL) ×3
ELECTRODE BLDE 4.0 EZ CLN MEGD (MISCELLANEOUS) IMPLANT
ELECTRODE REM PT RTRN 9FT ADLT (ELECTROSURGICAL) ×1 IMPLANT
GLOVE BIO SURGEON STRL SZ7 (GLOVE) ×3 IMPLANT
GLOVE BIO SURGEON STRL SZ7.5 (GLOVE) ×3 IMPLANT
GLOVE BIOGEL PI IND STRL 7.0 (GLOVE) ×1 IMPLANT
GLOVE BIOGEL PI IND STRL 8 (GLOVE) ×1 IMPLANT
GLOVE BIOGEL PI INDICATOR 7.0 (GLOVE) ×2
GLOVE BIOGEL PI INDICATOR 8 (GLOVE) ×2
GOWN STRL REUS W/ TWL LRG LVL3 (GOWN DISPOSABLE) ×1 IMPLANT
GOWN STRL REUS W/ TWL XL LVL3 (GOWN DISPOSABLE) ×1 IMPLANT
GOWN STRL REUS W/TWL LRG LVL3 (GOWN DISPOSABLE) ×3
GOWN STRL REUS W/TWL XL LVL3 (GOWN DISPOSABLE) ×3
GUIDEWIRE GLENOID 2.5X220 (WIRE) IMPLANT
HANDPIECE INTERPULSE COAX TIP (DISPOSABLE) ×3
HEMOSTAT SURGICEL 2X14 (HEMOSTASIS) ×3 IMPLANT
HOOD PEEL AWAY FLYTE STAYCOOL (MISCELLANEOUS) ×6 IMPLANT
KIT BASIN OR (CUSTOM PROCEDURE TRAY) ×3 IMPLANT
KIT ROOM TURNOVER OR (KITS) ×3 IMPLANT
MANIFOLD NEPTUNE II (INSTRUMENTS) ×3 IMPLANT
NDL MAYO TROCAR (NEEDLE) ×1 IMPLANT
NEEDLE MAYO TROCAR (NEEDLE) ×3 IMPLANT
NS IRRIG 1000ML POUR BTL (IV SOLUTION) ×3 IMPLANT
PACK SHOULDER (CUSTOM PROCEDURE TRAY) ×3 IMPLANT
PAD ARMBOARD 7.5X6 YLW CONV (MISCELLANEOUS) ×6 IMPLANT
RESTRAINT HEAD UNIVERSAL NS (MISCELLANEOUS) ×3 IMPLANT
RETRIEVER SUT HEWSON (MISCELLANEOUS) ×3 IMPLANT
SET HNDPC FAN SPRY TIP SCT (DISPOSABLE) ×1 IMPLANT
SLING ARM FOAM STRAP LRG (SOFTGOODS) ×3 IMPLANT
SLING ARM FOAM STRAP MED (SOFTGOODS) IMPLANT
SMARTMIX MINI TOWER (MISCELLANEOUS) ×3
SPONGE LAP 18X18 X RAY DECT (DISPOSABLE) ×3 IMPLANT
SPONGE LAP 4X18 X RAY DECT (DISPOSABLE) IMPLANT
STRIP CLOSURE SKIN 1/2X4 (GAUZE/BANDAGES/DRESSINGS) ×2 IMPLANT
SUCTION FRAZIER HANDLE 10FR (MISCELLANEOUS) ×2
SUCTION TUBE FRAZIER 10FR DISP (MISCELLANEOUS) ×1 IMPLANT
SUPPORT WRAP ARM LG (MISCELLANEOUS) ×3 IMPLANT
SUT ETHIBOND NAB CT1 #1 30IN (SUTURE) ×9 IMPLANT
SUT FIBERWIRE #2 38 T-5 BLUE (SUTURE)
SUT MNCRL AB 4-0 PS2 18 (SUTURE) ×3 IMPLANT
SUT VIC AB 0 CT1 27 (SUTURE) ×3
SUT VIC AB 0 CT1 27XBRD ANBCTR (SUTURE) IMPLANT
SUT VIC AB 2-0 CT1 27 (SUTURE) ×3
SUT VIC AB 2-0 CT1 TAPERPNT 27 (SUTURE) ×1 IMPLANT
SUTURE FIBERWR #2 38 T-5 BLUE (SUTURE) IMPLANT
TAPE LABRALWHITE 1.5X36 (TAPE) ×3 IMPLANT
TAPE SUT LABRALTAP WHT/BLK (SUTURE) ×3 IMPLANT
TOWEL OR 17X26 10 PK STRL BLUE (TOWEL DISPOSABLE) ×3 IMPLANT
TOWER SMARTMIX MINI (MISCELLANEOUS) ×1 IMPLANT

## 2017-05-18 NOTE — Discharge Instructions (Signed)

## 2017-05-18 NOTE — Addendum Note (Signed)
Addendum  created 05/18/17 1056 by Army FossaPulliam, Naliah Eddington Dane, CRNA   Intraprocedure Flowsheets edited

## 2017-05-18 NOTE — Anesthesia Procedure Notes (Signed)
Anesthesia Regional Block: Interscalene brachial plexus block   Pre-Anesthetic Checklist: ,, timeout performed, Correct Patient, Correct Site, Correct Laterality, Correct Procedure, Correct Position, site marked, Risks and benefits discussed,  Surgical consent,  Pre-op evaluation,  At surgeon's request and post-op pain management  Laterality: Left  Prep: chloraprep       Needles:  Injection technique: Single-shot  Needle Type: Echogenic Needle     Needle Length: 5cm  Needle Gauge: 21     Additional Needles:   Narrative:  Start time: 05/18/2017 7:18 AM End time: 05/18/2017 7:22 AM Injection made incrementally with aspirations every 5 mL.  Performed by: Personally  Anesthesiologist: Beryle LatheBrock, Dejanira Pamintuan E, MD  Additional Notes: No pain on injection. No increased resistance to injection. Injection made in 5cc increments. Good needle visualization. Patient tolerated the procedure well.

## 2017-05-18 NOTE — H&P (Signed)
Raymond Graham is an 67 y.o. male.   Chief Complaint: L shoulder pain and dysfunction HPI: Endstage L shoulder arthritis with significant pain and dysfunction, failed conservative measures.  Pain interferes with sleep and quality of life.   Past Medical History:  Diagnosis Date  . Arthritis    PAIN AND ARTHRITIS LEFT HIP AND LEFT KNEE osteoarthritis  . BPH (benign prostatic hypertrophy)    PT ON FLOMAX-DR. WRENN IS PT'S UROLOGIST  . GERD (gastroesophageal reflux disease)    OCAS-NO MEDS  . Hypertension   . Seasonal allergies     Past Surgical History:  Procedure Laterality Date  . BACK SURGERY  10/2008   LUMBAR SURGERY FOR STENOSIS  . COLONOSCOPY    . JOINT REPLACEMENT  2005   RIGHT HIP REPLACEMENT   . LAPAROTOMY N/A 07/23/2016   Procedure: EXPLORATORY LAPAROTOMY WITH [facility] REPAIR OF PYLORIC ULCER/HERNIA;  Surgeon: Stark Klein, MD;  Location: [place];  Service: General;  Laterality: N/A;  . LEFT BUNIONECTOMY    . LEFT GANGLION CYST REMOVED    . TOTAL HIP ARTHROPLASTY  05/31/2011   Procedure: TOTAL HIP ARTHROPLASTY ANTERIOR APPROACH;  (BILATERAL)Surgeon: Mauri Pole, MD;  Location: [facility];  Service: Orthopedics;  Laterality: Left;  . TOTAL KNEE ARTHROPLASTY  11/21/2011   Procedure: TOTAL KNEE ARTHROPLASTY;  Surgeon: Mauri Pole, MD;  Location: [facility];  Service: Orthopedics;  Laterality: Left;  . TOTAL KNEE ARTHROPLASTY Right 02/07/2017   Procedure: TOTAL KNEE ARTHROPLASTY;  Surgeon: Melrose Nakayama, MD;  Location: [place];  Service: Orthopedics;  Laterality: Right;    History reviewed. No pertinent family history. Social History:  reports that he quit smoking about 26 years ago. he has never used smokeless tobacco. He reports that he drinks about 1.2 oz of alcohol per week. He reports that he does not use drugs.  Allergies: No Known Allergies  Medications Prior to Admission  Medication Sig Dispense Refill  . acetaminophen (TYLENOL) 500 MG tablet Take 1,000 mg by  mouth every 8 (eight) hours as needed for moderate pain.    . cholecalciferol (VITAMIN D) 1000 units tablet Take 3,000 Units by mouth daily.     . cloNIDine (CATAPRES) 0.1 MG tablet Take 0.1 mg by mouth 2 (two) times daily.    . fluticasone (FLONASE) 50 MCG/ACT nasal spray Place 1 spray into both nostrils daily as needed for allergies or rhinitis.    Marland Kitchen gabapentin (NEURONTIN) 100 MG capsule Take 100 mg by mouth 3 (three) times daily.    . hydrochlorothiazide (HYDRODIURIL) 25 MG tablet Take 25 mg by mouth daily.    . Liniments (SALONPAS PAIN RELIEF PATCH EX) Apply 1 patch topically daily as needed (pain).    . metoprolol (TOPROL-XL) 200 MG 24 hr tablet Take 200 mg by mouth daily.    . Multiple Vitamin (MULTIVITAMIN WITH MINERALS) TABS Take 1 tablet by mouth daily.    Marland Kitchen omeprazole (PRILOSEC) 20 MG capsule Take 20 mg by mouth daily.    [place] (OYSTER CALCIUM) 500 MG TABS tablet Take 500 mg of elemental calcium by mouth daily.    . Tamsulosin HCl (FLOMAX) 0.4 MG CAPS Take 0.4 mg by mouth daily after breakfast.    . traZODone (DESYREL) 150 MG tablet Take 150 mg by mouth at bedtime as needed for sleep.    Marland Kitchen aspirin EC 325 MG EC tablet Take 1 tablet (325 mg total) by mouth 2 (two) times daily after a meal. (Patient not taking: Reported  on 05/08/2017) 30 tablet 0  . bisacodyl (DULCOLAX) 5 MG EC tablet Take 1 tablet (5 mg total) by mouth daily as needed for moderate constipation. (Patient not taking: Reported on 05/08/2017) 10 tablet 0  . docusate sodium (COLACE) 100 MG capsule Take 1 capsule (100 mg total) by mouth 2 (two) times daily. (Patient not taking: Reported on 05/08/2017) 30 capsule 0  . HYDROcodone-acetaminophen (NORCO) 7.5-325 MG tablet Take 1-2 tablets by mouth every 4 (four) hours as needed for severe pain ((score 7 to 10)). (Patient not taking: Reported on 05/08/2017) 40 tablet 0  . pantoprazole (PROTONIX) 40 MG tablet Take 1 tablet (40 mg total) by mouth 2 (two) times daily. (Patient not  taking: Reported on 01/24/2017) 60 tablet 2  . promethazine (PHENERGAN) 12.5 MG tablet Take 1-2 tablets (12.5-25 mg total) by mouth every 6 (six) hours as needed for nausea or vomiting. (Patient not taking: Reported on 05/08/2017) 30 tablet 0  . tiZANidine (ZANAFLEX) 4 MG tablet Take 1 tablet (4 mg total) by mouth every 6 (six) hours as needed for muscle spasms. (Patient not taking: Reported on 05/08/2017) 40 tablet 0    Results for orders placed or performed during the hospital encounter of 05/16/17 (from the past 48 hour(s))  Surgical pcr screen     Status: None   Collection Time: 05/16/17 10:16 AM  Result Value Ref Range   MRSA, PCR NEGATIVE NEGATIVE   Staphylococcus aureus NEGATIVE NEGATIVE    Comment: (NOTE) The Xpert SA Assay (FDA approved for NASAL specimens in patients 65 years of age and older), is one component of a comprehensive surveillance program. It is not intended to diagnose infection nor to guide or monitor treatment. Performed at [facility], [place] [address] [place], [place] [address]   Urinalysis, Routine w reflex microscopic     Status: None   Collection Time: 05/16/17 10:16 AM  Result Value Ref Range   Color, Urine YELLOW YELLOW   APPearance CLEAR CLEAR   Specific Gravity, Urine 1.011 1.005 - 1.030   pH 7.0 5.0 - 8.0   Glucose, UA NEGATIVE NEGATIVE mg/dL   Hgb urine dipstick NEGATIVE NEGATIVE   Bilirubin Urine NEGATIVE NEGATIVE   Ketones, ur NEGATIVE NEGATIVE mg/dL   Protein, ur NEGATIVE NEGATIVE mg/dL   Nitrite NEGATIVE NEGATIVE   Leukocytes, UA NEGATIVE NEGATIVE    Comment: Performed at [facility] [address], [place], [place] [address]  APTT     Status: None   Collection Time: 05/16/17 10:17 AM  Result Value Ref Range   aPTT 28 24 - 36 seconds    Comment: Performed at [facility] [address], [place], [place] [address]  CBC WITH DIFFERENTIAL     Status: None   Collection Time: 05/16/17 10:17 AM  Result Value Ref Range    WBC 5.1 4.0 - 10.5 K/uL   RBC 4.73 4.22 - 5.81 MIL/uL   Hemoglobin 13.7 13.0 - 17.0 g/dL   HCT 41.8 39.0 - 52.0 %   MCV 88.4 78.0 - 100.0 fL   MCH 29.0 26.0 - 34.0 pg   MCHC 32.8 30.0 - 36.0 g/dL   RDW 13.6 11.5 - 15.5 %   Platelets 234 150 - 400 K/uL   Neutrophils Relative % 60 %   Neutro Abs 3.1 1.7 - 7.7 K/uL   Lymphocytes Relative 25 %   Lymphs Abs 1.3 0.7 - 4.0 K/uL   Monocytes Relative 12 %   Monocytes Absolute  0.6 0.1 - 1.0 K/uL   Eosinophils Relative 3 %   Eosinophils Absolute 0.1 0.0 - 0.7 K/uL   Basophils Relative 0 %   Basophils Absolute 0.0 0.0 - 0.1 K/uL    Comment: Performed at [facility] [address], [place], [place] [address]  Comprehensive metabolic panel     Status: Abnormal   Collection Time: 05/16/17 10:17 AM  Result Value Ref Range   Sodium 140 135 - 145 mmol/L   Potassium 4.1 3.5 - 5.1 mmol/L   Chloride 103 101 - 111 mmol/L   CO2 24 22 - 32 mmol/L   Glucose, Bld 103 (H) 65 - 99 mg/dL   BUN 11 6 - 20 mg/dL   Creatinine, Ser 0.99 0.61 - 1.24 mg/dL   Calcium 9.4 8.9 - 10.3 mg/dL   Total Protein 7.1 6.5 - 8.1 g/dL   Albumin 3.7 3.5 - 5.0 g/dL   AST 23 15 - 41 U/L   ALT 19 17 - 63 U/L   Alkaline Phosphatase 96 38 - 126 U/L   Total Bilirubin 0.7 0.3 - 1.2 mg/dL   GFR calc non Af Amer >60 >60 mL/min   GFR calc Af Amer >60 >60 mL/min    Comment: (NOTE) The eGFR has been calculated using the CKD EPI equation. This calculation has not been validated in all clinical situations. eGFR's persistently <60 mL/min signify possible Chronic Kidney Disease.    Anion gap 13 5 - 15    Comment: Performed at [facility] [address], [place], [place] [address]  Protime-INR     Status: Abnormal   Collection Time: 05/16/17 10:17 AM  Result Value Ref Range   Prothrombin Time 19.5 (H) 11.4 - 15.2 seconds   INR 1.67     Comment: Performed at [facility] [address] [place], [place] [address]  Type and screen Order type and screen if day  of surgery is less than 15 days from draw of preadmission visit or order morning of surgery if day of surgery is greater than 6 days from preadmission visit.     Status: None   Collection Time: 05/16/17 10:31 AM  Result Value Ref Range   ABO/RH(D) AB POS    Antibody Screen NEG    Sample Expiration      05/19/2017 Performed at [facility], [place] [address], [place], [place] [address]    Dg Chest 2 View  Result Date: 05/16/2017 CLINICAL DATA:  Preop exam for left shoulder arthroplasty. EXAM: CHEST  2 VIEW COMPARISON:  01/30/2017 FINDINGS: Cardiac silhouette is normal in size. No mediastinal or hilar masses. No evidence of adenopathy. Lungs are hyperexpanded but clear. No pleural effusion or pneumothorax. Skeletal structures are demineralized but intact. IMPRESSION: No active cardiopulmonary disease. Electronically Signed   By: Lajean Manes M.D.   On: 05/16/2017 13:50    Review of Systems  All other systems reviewed and are negative.   Blood pressure (!) 148/95, pulse (!) 59, temperature (!) 97.5 F (36.4 C), temperature source Oral, resp. rate 18, SpO2 100 %. Physical Exam  Constitutional: He is oriented to person, place, and time. He appears well-developed and well-nourished.  HENT:  Head: Atraumatic.  Eyes: EOM are normal.  Cardiovascular: Intact distal pulses.  Respiratory: Effort normal.  Musculoskeletal:  L shoulder pain with limited ROM. NVID  Neurological: He is alert and oriented to person, place, and time.  Skin: Skin is warm and dry.  Psychiatric:  He has a normal mood and affect.     Assessment/Plan L shoulder endstage arthritis Plan L TSA Risks / benefits of surgery discussed Consent on chart  NPO for OR Preop antibiotics   Isabella Stalling, MD 05/18/2017, 7:12 AM

## 2017-05-18 NOTE — Op Note (Signed)
Procedure(s): LEFT TOTAL SHOULDER ARTHROPLASTY Procedure Note  Raymond Graham male 67 y.o. 05/18/2017  Procedure(s) and Anesthesia Type:    * LEFT TOTAL SHOULDER ARTHROPLASTY - Choice       LEFT PROXIMAL LONG HEAD BICEPS TENDODESIS  Surgeon(s) and Role:    Jones Broom, MD - Primary   Indications:  67 y.o. male  With endstage left shoulder arthritis. Pain and dysfunction interfered with quality of life and nonoperative treatment with activity modification, NSAIDS and injections failed.     Surgeon: Berline Lopes   Assistants: Damita Lack PA-C [facility] was present and scrubbed throughout the procedure and was essential in positioning, retraction, exposure, and closure)  Anesthesia: General endotracheal anesthesia with preoperative interscalene block given by the attending anesthesiologist    Procedure Detail  LEFT TOTAL SHOULDER ARTHROPLASTY  Findings: Tornier flex anatomic press-fit size 4 stem with a 52X23 head, cemented size 66m Cortiloc glenoid.   A lesser tuberosity osteotomy was performed and repaired at the conclusion of the procedure.  Estimated Blood Loss:  200 mL         Drains: None   Blood Given: none          Specimens: none        Complications:  * No complications entered in OR log *         Disposition: PACU - hemodynamically stable.         Condition: stable    Procedure:   The patient was identified in the preoperative holding area where I personally marked the operative extremity after verifying with the patient and consent. He  was taken to the operating room where He was transferred to the   operative table.  The patient received an interscalene block in   the holding area by the attending anesthesiologist.  General anesthesia was induced   in the operating room without complication.  The patient did receive IV  Ancef prior to the commencement of the procedure.  The patient was   placed in the beach-chair position with the  back raised about 30   degrees.  The nonoperative extremity and head and neck were carefully   positioned and padded protecting against neurovascular compromise.  The   left upper extremity was then prepped and draped in the standard sterile   fashion.    The appropriate operative time-out was performed with   Anesthesia, the perioperative staff, as well as myself and we all agreed   that the left side was the correct operative site.  The patient received 1 g IV tranexamic acid at the start of the case around time of the incision.  An approximately   10 cm incision was made from the tip of the coracoid to the center point of the   humerus at the level of the axilla.  Dissection was carried down sharply   through subcutaneous tissues and cephalic vein was identified and taken   laterally with the deltoid.  The pectoralis major was taken medially.  The   upper 1 cm of the pectoralis major was released from its attachment on   the humerus.  The clavipectoral fascia was incised just lateral to the   conjoined tendon.  This incision was carried up to but not into the   coracoacromial ligament.  Digital palpation was used to prove   integrity of the axillary nerve which was protected throughout the   procedure.  Musculocutaneous nerve was not palpated in the operative   field.  Conjoined tendon was then retracted gently medially and the   deltoid laterally.  Anterior circumflex humeral vessels were clamped and   coagulated.  The soft tissues overlying the biceps was incised and this   incision was carried across the transverse humeral ligament to the base   of the coracoid.  The biceps was noted to be severely degenerated. It was released from the superior labrum. The biceps was then tenodesed to the soft tissue just above   pectoralis major and the remaining portion of the biceps superiorly was   excised.  An osteotomy was performed at the lesser tuberosity.  The capsule was then   released all  the way down to the 6 o'clock position of the humeral head.   The humeral head was then delivered with simultaneous adduction,   extension and external rotation.  All humeral osteophytes were removed   and the anatomic neck of the humerus was marked and cut free hand at   approximately 25 degrees retroversion within about 3 mm of the cuff   reflection posteriorly.  The head size was estimated to be a 52 medium   offset.  At that point, the humeral head was retracted posteriorly with   a Fukuda retractor.   Remaining portion of the capsule was released at the base of the   coracoid.  The remaining biceps anchor and the entire anterior-inferior   labrum was excised.  The posterior labrum was also excised but the   posterior capsule was not released.  The guidepin was placed bicortically with non elevated guide.  The reamer was used to ream to concentric bone with punctate bleeding.  This gave an excellent concentric surface.  The center hole was then drilled for an anchor peg glenoid followed by the three peripheral holes and none of the holes   exited the glenoid wall.  I then pulse irrigated these holes and dried   them with Surgicel.  The three peripheral holes were then   pressurized cemented and the anchor peg glenoid was placed and impacted   with an excellent fit.  The glenoid was a 7649m component.  The proximal humerus was then again exposed taking care not to displace the glenoid.    The entry awl was used followed by sounding reamers and then sequentially broached from size 1 to 4. This was then left in place and the calcar planer was used. Trial head was placed with a 52x23.  With the trial implantation of the component,  there was approximately 50% posterior translation with immediate snap back to the   anatomic position.  With forward elevation, there was no tendency   towards posterior subluxation.   The trial was removed and the final implant was prepared on a back table.  The trial was  removed and the final implant was prepared on a back table.   3 small holes were drilled on the medial side of the lesser tuberosity osteotomy, through which 2 labral tapes were passed. The implant was then placed through the loop of the 2 labral tapes and impacted with an excellent press-fit. This achieved excellent anatomic reconstruction of the proximal humerus.  The joint was then copiously irrigated with pulse lavage.  The subscapularis and   lesser tuberosity osteotomy were then repaired using the 2 labral tapes previously passed in a double row fashion with horizontal mattress sutures medially brought over through bone tunnels tied over a bone bridge laterally.   One #1 Ethibond was placed at  the rotator interval just above   the lesser tuberosity. Copious irrigation was used. Skin was closed with 2-0 Vicryl sutures in the deep dermal layer and 4-0 Monocryl in a subcuticular  running fashion.  Sterile dressings were then applied including Aquacel.  The patient was placed in a sling and allowed to awaken from general anesthesia and taken to the recovery room in stable condition.      POSTOPERATIVE PLAN:  Early passive range of motion will be allowed with the goal of 0 degrees external rotation and 90 degrees forward elevation.  No internal rotation at this time.  No active motion of the arm until the lesser tuberosity heals.  The patient will likely be kept in the hospital for 1-2 days and then discharged home.

## 2017-05-18 NOTE — Transfer of Care (Signed)
Immediate Anesthesia Transfer of Care Note  Patient: Raymond Graham  Procedure(s) Performed: LEFT TOTAL SHOULDER ARTHROPLASTY (Left )  Patient Location: PACU  Anesthesia Type:GA combined with regional for post-op pain  Level of Consciousness: drowsy and patient cooperative  Airway & Oxygen Therapy: Patient Spontanous Breathing and Patient connected to face mask oxygen  Post-op Assessment: Report given to RN and Post -op Vital signs reviewed and stable  Post vital signs: Reviewed and stable  Last Vitals:  Vitals:   05/18/17 0550  BP: (!) 148/95  Pulse: (!) 59  Resp: 18  Temp: (!) 36.4 C  SpO2: 100%    Last Pain:  Vitals:   05/18/17 0603  TempSrc:   PainSc: 6       Patients Stated Pain Goal: 3 (05/18/17 0603)  Complications: No apparent anesthesia complications

## 2017-05-18 NOTE — Anesthesia Procedure Notes (Signed)
Procedure Name: Intubation Date/Time: 05/18/2017 7:40 AM Performed by: Freddie Breech, CRNA Pre-anesthesia Checklist: Patient identified, Emergency Drugs available, Suction available and Patient being monitored Patient Re-evaluated:Patient Re-evaluated prior to induction Oxygen Delivery Method: Circle System Utilized Preoxygenation: Pre-oxygenation with 100% oxygen Induction Type: IV induction Ventilation: Mask ventilation without difficulty Laryngoscope Size: Mac and 4 Grade View: Grade III Tube type: Oral Tube size: 7.5 mm Number of attempts: 1 Airway Equipment and Method: Stylet and Oral airway Placement Confirmation: ETT inserted through vocal cords under direct vision,  positive ETCO2 and breath sounds checked- equal and bilateral Secured at: 23 cm Tube secured with: Tape Dental Injury: Teeth and Oropharynx as per pre-operative assessment

## 2017-05-18 NOTE — Anesthesia Postprocedure Evaluation (Signed)
Anesthesia Post Note  Patient: Raymond Graham  Procedure(s) Performed: LEFT TOTAL SHOULDER ARTHROPLASTY (Left )     Patient location during evaluation: PACU Anesthesia Type: General Level of consciousness: awake and alert Pain management: pain level controlled Vital Signs Assessment: post-procedure vital signs reviewed and stable Respiratory status: spontaneous breathing, nonlabored ventilation and respiratory function stable Cardiovascular status: blood pressure returned to baseline and stable Postop Assessment: no apparent nausea or vomiting Anesthetic complications: no    Last Vitals:  Vitals:   05/18/17 1015 05/18/17 1030  BP: 107/68 110/70  Pulse: (!) 51 (!) 59  Resp: 16 14  Temp:    SpO2: 96% 97%    Last Pain:  Vitals:   05/18/17 1015  TempSrc:   PainSc: 0-No pain                 Beryle Lathehomas E Amandine Covino

## 2017-05-19 ENCOUNTER — Encounter (HOSPITAL_COMMUNITY): Payer: Self-pay | Admitting: Orthopedic Surgery

## 2017-05-19 LAB — BASIC METABOLIC PANEL
ANION GAP: 11 (ref 5–15)
BUN: 12 mg/dL (ref 6–20)
CALCIUM: 8.3 mg/dL — AB (ref 8.9–10.3)
CHLORIDE: 102 mmol/L (ref 101–111)
CO2: 25 mmol/L (ref 22–32)
Creatinine, Ser: 1.12 mg/dL (ref 0.61–1.24)
GFR calc non Af Amer: >60 mL/min (ref >60)
Glucose, Bld: 127 mg/dL — ABNORMAL HIGH (ref 65–99)
Potassium: 3.7 mmol/L (ref 3.5–5.1)
SODIUM: 138 mmol/L (ref 135–145)

## 2017-05-19 LAB — CBC
HEMATOCRIT: 33.6 % — AB (ref 39.0–52.0)
HEMOGLOBIN: 11 g/dL — AB (ref 13.0–17.0)
MCH: 29.2 pg (ref 26.0–34.0)
MCHC: 32.7 g/dL (ref 30.0–36.0)
MCV: 89.1 fL (ref 78.0–100.0)
Platelets: 182 10*3/uL (ref 150–400)
RBC: 3.77 MIL/uL — ABNORMAL LOW (ref 4.22–5.81)
RDW: 14.1 % (ref 11.5–15.5)
WBC: 10.7 10*3/uL — AB (ref 4.0–10.5)

## 2017-05-19 MED ORDER — OXYCODONE-ACETAMINOPHEN 5-325 MG PO TABS: 1.0000 | ORAL_TABLET | ORAL | 0 refills | Status: DC | PRN

## 2017-05-19 NOTE — Discharge Summary (Signed)
Patient ID: Maxie BetterLarry S Fulghum MRN: [id number] DOB/AGE: 12-11-1950 67 y.o.  Admit date: 05/18/2017 Discharge date: 05/19/2017  Admission Diagnoses:  Active Problems:   S/P shoulder replacement, left   Discharge Diagnoses:  Same  Past Medical History:  Diagnosis Date  . Arthritis    PAIN AND ARTHRITIS LEFT HIP AND LEFT KNEE osteoarthritis  . BPH (benign prostatic hypertrophy)    PT ON FLOMAX-DR. WRENN IS PT'S UROLOGIST  . GERD (gastroesophageal reflux disease)    OCAS-NO MEDS  . Hypertension   . Seasonal allergies     Surgeries: Procedure(s): LEFT TOTAL SHOULDER ARTHROPLASTY on 05/18/2017   Consultants:   Discharged Condition: Improved  Hospital Course: Maxie BetterLarry S Shell is an 67 y.o. male who was admitted 05/18/2017 for operative treatment of left shoulder end stage OA. Patient has severe unremitting pain that affects sleep, daily activities, and work/hobbies. After pre-op clearance the patient was taken to the operating room on 05/18/2017 and underwent  Procedure(s): LEFT TOTAL SHOULDER ARTHROPLASTY.    Patient was given perioperative antibiotics:  Anti-infectives (From admission, onward)   Start     Dose/Rate Route Frequency Ordered Stop   05/18/17 1400  ceFAZolin (ANCEF) IVPB 2g/100 mL premix     2 g 200 mL/hr over 30 Minutes Intravenous Every 6 hours 05/18/17 1301 05/19/17 0153   05/18/17 0555  ceFAZolin (ANCEF) IVPB 2g/100 mL premix     2 g 200 mL/hr over 30 Minutes Intravenous On call to O.R. 05/18/17 [id number] 05/18/17 0746       Patient was given sequential compression devices, early ambulation, and asa to prevent DVT.  Patient benefited maximally from hospital stay and there were no complications.    Recent vital signs:  Patient Vitals for the past 24 hrs:  BP Temp Temp src Pulse Resp SpO2  05/19/17 0527 122/75 97.9 F (36.6 C) Oral 63 17 100 %  05/19/17 0039 118/75 97.6 F (36.4 C) Oral 60 17 100 %  05/18/17 2024 134/90 97.6 F (36.4 C) Oral 63 18 99 %  05/18/17  1303 118/71 97.7 F (36.5 C) Oral (!) 53 17 98 %  05/18/17 1230 106/66 (!) 97 F (36.1 C) - (!) 58 16 98 %  05/18/17 1200 - - - (!) 55 - -  05/18/17 1145 104/67 - - - 16 99 %  05/18/17 1045 110/72 97.7 F (36.5 C) - 62 16 97 %  05/18/17 1030 110/70 - - (!) 59 14 97 %  05/18/17 1015 107/68 - - (!) 51 16 96 %  05/18/17 1000 107/65 - - (!) 58 15 100 %  05/18/17 0946 117/75 (!) 97.4 F (36.3 C) - 67 (!) 21 100 %     Recent laboratory studies:  Recent Labs    05/16/17 1017 05/19/17 0631  WBC 5.1 10.7*  HGB 13.7 11.0*  HCT 41.8 33.6*  PLT 234 182  NA 140 138  K 4.1 3.7  CL 103 102  CO2 24 25  BUN 11 12  CREATININE 0.99 1.12  GLUCOSE 103* 127*  INR 1.67  --   CALCIUM 9.4 8.3*     Discharge Medications:   Allergies as of 05/19/2017   No Known Allergies     Medication List    STOP taking these medications   acetaminophen 500 MG tablet Commonly known as:  TYLENOL   HYDROcodone-acetaminophen 7.5-325 MG tablet Commonly known as:  NORCO     TAKE these medications   aspirin 325 MG EC tablet Take 1 tablet (325  mg total) by mouth 2 (two) times daily after a meal.   bisacodyl 5 MG EC tablet Commonly known as:  DULCOLAX Take 1 tablet (5 mg total) by mouth daily as needed for moderate constipation.   cholecalciferol 1000 units tablet Commonly known as:  VITAMIN D Take 3,000 Units by mouth daily.   cloNIDine 0.1 MG tablet Commonly known as:  CATAPRES Take 0.1 mg by mouth 2 (two) times daily.   docusate sodium 100 MG capsule Commonly known as:  COLACE Take 1 capsule (100 mg total) by mouth 2 (two) times daily.   fluticasone 50 MCG/ACT nasal spray Commonly known as:  FLONASE Place 1 spray into both nostrils daily as needed for allergies or rhinitis.   gabapentin 100 MG capsule Commonly known as:  NEURONTIN Take 100 mg by mouth 3 (three) times daily.   hydrochlorothiazide 25 MG tablet Commonly known as:  HYDRODIURIL Take 25 mg by mouth daily.   metoprolol 200  MG 24 hr tablet Commonly known as:  TOPROL-XL Take 200 mg by mouth daily.   multivitamin with minerals Tabs tablet Take 1 tablet by mouth daily.   omeprazole 20 MG capsule Commonly known as:  PRILOSEC Take 20 mg by mouth daily.   oxyCODONE-acetaminophen 5-325 MG tablet Commonly known as:  PERCOCET Take 1-2 tablets by mouth every 4 (four) hours as needed for severe pain.   oyster calcium 500 MG Tabs tablet Take 500 mg of elemental calcium by mouth daily.   pantoprazole 40 MG tablet Commonly known as:  PROTONIX Take 1 tablet (40 mg total) by mouth 2 (two) times daily.   promethazine 12.5 MG tablet Commonly known as:  PHENERGAN Take 1-2 tablets (12.5-25 mg total) by mouth every 6 (six) hours as needed for nausea or vomiting.   SALONPAS PAIN RELIEF PATCH EX Apply 1 patch topically daily as needed (pain).   tamsulosin 0.4 MG Caps capsule Commonly known as:  FLOMAX Take 0.4 mg by mouth daily after breakfast.   tiZANidine 4 MG tablet Commonly known as:  ZANAFLEX Take 1 tablet (4 mg total) by mouth every 6 (six) hours as needed for muscle spasms.   traZODone 150 MG tablet Commonly known as:  DESYREL Take 150 mg by mouth at bedtime as needed for sleep.       Diagnostic Studies: Dg Chest 2 View  Result Date: 05/16/2017 CLINICAL DATA:  Preop exam for left shoulder arthroplasty. EXAM: CHEST  2 VIEW COMPARISON:  01/30/2017 FINDINGS: Cardiac silhouette is normal in size. No mediastinal or hilar masses. No evidence of adenopathy. Lungs are hyperexpanded but clear. No pleural effusion or pneumothorax. Skeletal structures are demineralized but intact. IMPRESSION: No active cardiopulmonary disease. Electronically Signed   By: Amie Portland M.D.   On: 05/16/2017 13:50   Ct Shoulder Left Wo Contrast  Result Date: 05/02/2017 CLINICAL DATA:  Osteoarthritis of the left shoulder.  Chronic pain. EXAM: CT OF THE UPPER LEFT EXTREMITY WITHOUT CONTRAST TECHNIQUE: Multidetector CT imaging of the  upper left extremity was performed according to the standard protocol. COMPARISON:  None. FINDINGS: Bones/Joint/Cartilage No fracture or dislocation. Normal alignment. No joint effusion. Severe joint space narrowing of the left glenohumeral joint with a bone-on-bone appearance, subchondral sclerosis and mild subchondral cystic changes. Inferior humeral marginal osteophyte. Moderate arthropathy of the acromioclavicular joint. Type II acromion. Ligaments Ligaments are suboptimally evaluated by CT. Muscles and Tendons Muscles are normal. No muscle atrophy. No intramuscular fluid collection. Soft tissue No fluid collection or hematoma.  No soft tissue  mass. IMPRESSION: 1. Severe osteoarthritis of the left glenohumeral joint. 2. Moderate arthropathy of the acromioclavicular joint. Electronically Signed   By: Elige Ko   On: 05/02/2017 10:09   Dg Shoulder Left Port  Result Date: 05/18/2017 CLINICAL DATA:  Status post left shoulder replacement today. EXAM: LEFT SHOULDER - 1 VIEW COMPARISON:  CT left shoulder 05/02/2017. FINDINGS: New left shoulder arthroplasty is in place. No fracture. The shoulder is located. Acromioclavicular osteoarthritis is noted. IMPRESSION: Status post left shoulder replacement.  No acute finding. Electronically Signed   By: Drusilla Kanner M.D.   On: 05/18/2017 10:35    Disposition: 06-Home-Health Care Svc  Discharge Instructions    Call MD / Call 911   Complete by:  As directed    If you experience chest pain or shortness of breath, CALL 911 and be transported to the hospital emergency room.  If you develope a fever above 101 F, pus (white drainage) or increased drainage or redness at the wound, or calf pain, call your surgeon's office.   Constipation Prevention   Complete by:  As directed    Drink plenty of fluids.  Prune juice may be helpful.  You may use a stool softener, such as Colace (over the counter) 100 mg twice a day.  Use MiraLax (over the counter) for constipation as  needed.   Diet - low sodium heart healthy   Complete by:  As directed    Increase activity slowly as tolerated   Complete by:  As directed       Follow-up Information    Jones Broom, MD. Schedule an appointment as soon as possible for a visit in 2 weeks.   Specialty:  Orthopedic Surgery Contact information: [address] [place] [address] [phone]            Signed: Jiles Harold 05/19/2017, 7:58 AM

## 2017-05-19 NOTE — Progress Notes (Signed)
Patient discharged to home. Family here to pick up patient, discharge instructions and prescriptions given to patient. They verbalized understanding.

## 2017-05-19 NOTE — Progress Notes (Signed)
   PATIENT ID: Maxie BetterLarry S Minteer   1 Day Post-Op Procedure(s) (LRB): LEFT TOTAL SHOULDER ARTHROPLASTY (Left)  Subjective: Doing well this morning. Some effects of the block still active. Minimal pain, comfortable. No other complaints or concerns.   Objective:  Vitals:   05/19/17 0039 05/19/17 0527  BP: 118/75 122/75  Pulse: 60 63  Resp: 17 17  Temp: 97.6 F (36.4 C) 97.9 F (36.6 C)  SpO2: 100% 100%     L UE dressing c/d/i Wiggles fingers, distally NIV  Labs:  Recent Labs    05/16/17 1017 05/19/17 0631  HGB 13.7 11.0*   Recent Labs    05/16/17 1017 05/19/17 0631  WBC 5.1 10.7*  RBC 4.73 3.77*  HCT 41.8 33.6*  PLT 234 182   Recent Labs    05/16/17 1017 05/19/17 0631  NA 140 138  K 4.1 3.7  CL 103 102  CO2 24 25  BUN 11 12  CREATININE 0.99 1.12  GLUCOSE 103* 127*  CALCIUM 9.4 8.3*    Assessment and Plan: 1 day s/p L TSA OT- PROM to goal of 90 FF 0 ER D/c home today when cleared by OT Scripts in chart Fu w Dr. [facility] Filter[facility] in 2 weeks  VTE proph: asa, scds

## 2017-05-19 NOTE — Evaluation (Signed)
Occupational Therapy Evaluation Patient Details Name: Raymond BetterLarry S Graham MRN: [id number] DOB: 1951/02/24 Today's Date: 05/19/2017    History of Present Illness s/p LEFT TOTAL SHOULDER ARTHROPLASTY    Clinical Impression   Pt admitted with the above diagnoses and presents with below problem list. PTA pt was independent with ADLs. Pt currently supervision for functional mobility/transfers, min A for UB/LB ADLs. Spouse assisting at home. Good toleration and range during L e/w/h ROM exercises. Shoulder education completed. Pt awaiting d/c home this morning. No further acute OT needs indicated at this time. OT signing off.     Follow Up Recommendations  DC plan and follow up therapy as arranged by surgeon    Equipment Recommendations  None recommended by OT    Recommendations for Other Services       Precautions / Restrictions Precautions Precautions: Shoulder Type of Shoulder Precautions: passive protocol Shoulder Interventions: Shoulder sling/immobilizer;At all times;Off for dressing/bathing/exercises Precaution Booklet Issued: Yes (comment) Precaution Comments: reviewed precautions Required Braces or Orthoses: Sling Restrictions Weight Bearing Restrictions: Yes LUE Weight Bearing: Non weight bearing      Mobility Bed Mobility Overal bed mobility: Modified Independent                Transfers Overall transfer level: Needs assistance Equipment used: None Transfers: Sit to/from Stand Sit to Stand: Supervision              Balance Overall balance assessment: No apparent balance deficits (not formally assessed)                                         ADL either performed or assessed with clinical judgement   ADL Overall ADL's : Needs assistance/impaired Eating/Feeding: Set up;Sitting   Grooming: Set up;Minimal assistance;Sitting   Upper Body Bathing: Minimal assistance;Sitting   Lower Body Bathing: Minimal assistance;Sit to/from stand    Upper Body Dressing : Minimal assistance;Sitting   Lower Body Dressing: Minimal assistance;Sit to/from stand   Toilet Transfer: Min guard;Ambulation   Toileting- Clothing Manipulation and Hygiene: Set up;Min guard;Sit to/from stand   Tub/ Shower Transfer: Walk-in shower;Min guard;Ambulation;Shower seat   Functional mobility during ADLs: Supervision/safety General ADL Comments: Bed mobility, in-room functinonal mobility as above. Shoulder education completed.     Vision Baseline Vision/History: Wears glasses       Perception     Praxis      Pertinent Vitals/Pain Pain Assessment: 0-10 Pain Score: 7  Pain Location: L shoulder Pain Descriptors / Indicators: Aching;Sore Pain Intervention(s): Limited activity within patient's tolerance;Monitored during session;Repositioned;Patient requesting pain meds-RN notified;Ice applied;RN gave pain meds during session     Hand Dominance Right   Extremity/Trunk Assessment Upper Extremity Assessment Upper Extremity Assessment: LUE deficits/detail LUE Deficits / Details: s/p LEFT TOTAL SHOULDER ARTHROPLASTY. good e/w/h ROM.   Lower Extremity Assessment Lower Extremity Assessment: Overall WFL for tasks assessed   Cervical / Trunk Assessment Cervical / Trunk Assessment: Normal   Communication Communication Communication: No difficulties   Cognition Arousal/Alertness: Awake/alert Behavior During Therapy: WFL for tasks assessed/performed Overall Cognitive Status: Within Functional Limits for tasks assessed                                     General Comments       Exercises Exercises: Other exercises Other Exercises Other Exercises: e/w/h ROM in  supine. good toleration and range.   Shoulder Instructions      Home Living Family/patient expects to be discharged to:: Private residence Living Arrangements: Spouse/significant other Available Help at Discharge: Family;Available 24 hours/day Type of Home:  House Home Access: Stairs to enter [employer] of Steps: 4 Entrance Stairs-Rails: None Home Layout: One level     Bathroom Shower/Tub: Producer, television/film/video: Standard     Home Equipment: Environmental consultant - 2 wheels;Bedside commode;Shower seat;Hand held shower head          Prior Functioning/Environment Level of Independence: Independent                 OT Problem List: Impaired balance (sitting and/or standing);Decreased knowledge of use of DME or AE;Decreased knowledge of precautions;Pain;Impaired UE functional use      OT Treatment/Interventions:      OT Goals(Current goals can be found in the care plan section) Acute Rehab OT Goals Patient Stated Goal: home today  OT Frequency:     Barriers to D/C:            Co-evaluation              AM-PAC PT "6 Clicks" Daily Activity     Outcome Measure Help from another person eating meals?: None Help from another person taking care of personal grooming?: A Little Help from another person toileting, which includes using toliet, bedpan, or urinal?: A Little Help from another person bathing (including washing, rinsing, drying)?: A Little Help from another person to put on and taking off regular upper body clothing?: A Little Help from another person to put on and taking off regular lower body clothing?: A Little 6 Click Score: 19   End of Session Equipment Utilized During Treatment: Other (comment)(sling) Nurse Communication: Patient requests pain meds  Activity Tolerance: Patient tolerated treatment well Patient left: with call bell/phone within reach;in chair  OT Visit Diagnosis: Pain;Unsteadiness on feet (R26.81) Pain - Right/Left: Left Pain - part of body: Shoulder                Time: 1610-9604 OT Time Calculation (min): 29 min Charges:  OT General Charges $OT Visit: 1 Visit OT Evaluation $OT Eval Low Complexity: 1 Low OT Treatments $Self Care/Home Management : 8-22 mins G-Codes:        Pilar Grammes 05/19/2017, 10:12 AM

## 2017-06-02 DIAGNOSIS — M19012 Primary osteoarthritis, left shoulder: Secondary | ICD-10-CM | POA: Diagnosis not present

## 2017-06-02 DIAGNOSIS — Z471 Aftercare following joint replacement surgery: Secondary | ICD-10-CM | POA: Diagnosis not present

## 2017-06-02 DIAGNOSIS — Z96612 Presence of left artificial shoulder joint: Secondary | ICD-10-CM | POA: Diagnosis not present

## 2017-06-13 DIAGNOSIS — M79609 Pain in unspecified limb: Secondary | ICD-10-CM | POA: Diagnosis not present

## 2017-06-13 DIAGNOSIS — I10 Essential (primary) hypertension: Secondary | ICD-10-CM | POA: Diagnosis not present

## 2017-06-13 DIAGNOSIS — Z79899 Other long term (current) drug therapy: Secondary | ICD-10-CM | POA: Diagnosis not present

## 2017-06-13 DIAGNOSIS — N19 Unspecified kidney failure: Secondary | ICD-10-CM | POA: Diagnosis not present

## 2017-06-30 DIAGNOSIS — M19012 Primary osteoarthritis, left shoulder: Secondary | ICD-10-CM | POA: Diagnosis not present

## 2017-07-03 DIAGNOSIS — M25612 Stiffness of left shoulder, not elsewhere classified: Secondary | ICD-10-CM | POA: Diagnosis not present

## 2017-07-03 DIAGNOSIS — M25512 Pain in left shoulder: Secondary | ICD-10-CM | POA: Diagnosis not present

## 2017-07-03 DIAGNOSIS — Z96612 Presence of left artificial shoulder joint: Secondary | ICD-10-CM | POA: Diagnosis not present

## 2017-07-05 DIAGNOSIS — M25612 Stiffness of left shoulder, not elsewhere classified: Secondary | ICD-10-CM | POA: Diagnosis not present

## 2017-07-05 DIAGNOSIS — M25512 Pain in left shoulder: Secondary | ICD-10-CM | POA: Diagnosis not present

## 2017-07-05 DIAGNOSIS — Z96612 Presence of left artificial shoulder joint: Secondary | ICD-10-CM | POA: Diagnosis not present

## 2017-11-13 IMAGING — CR DG KNEE 1-2V*R*
3 series · 3 of 3 positions shown · non-contrast
Comparison: None.

CLINICAL DATA: Preop evaluation for upcoming knee replacement

EXAM:
RIGHT KNEE - 1-2 VIEW

[t knee ap right (1 of 2)]
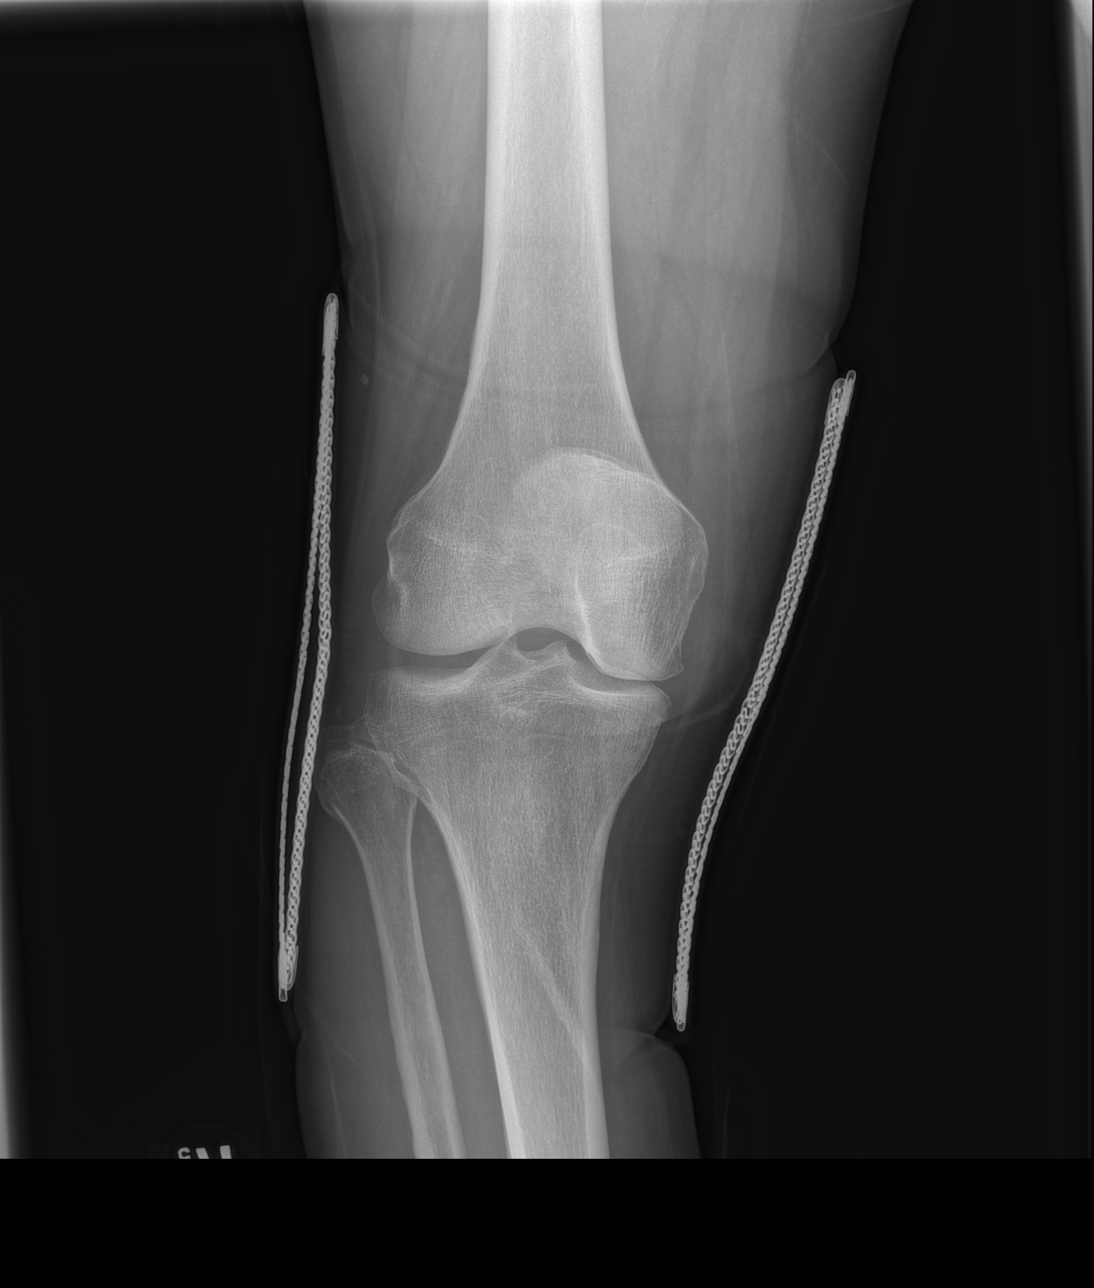

[t knee ap right (2 of 2)]
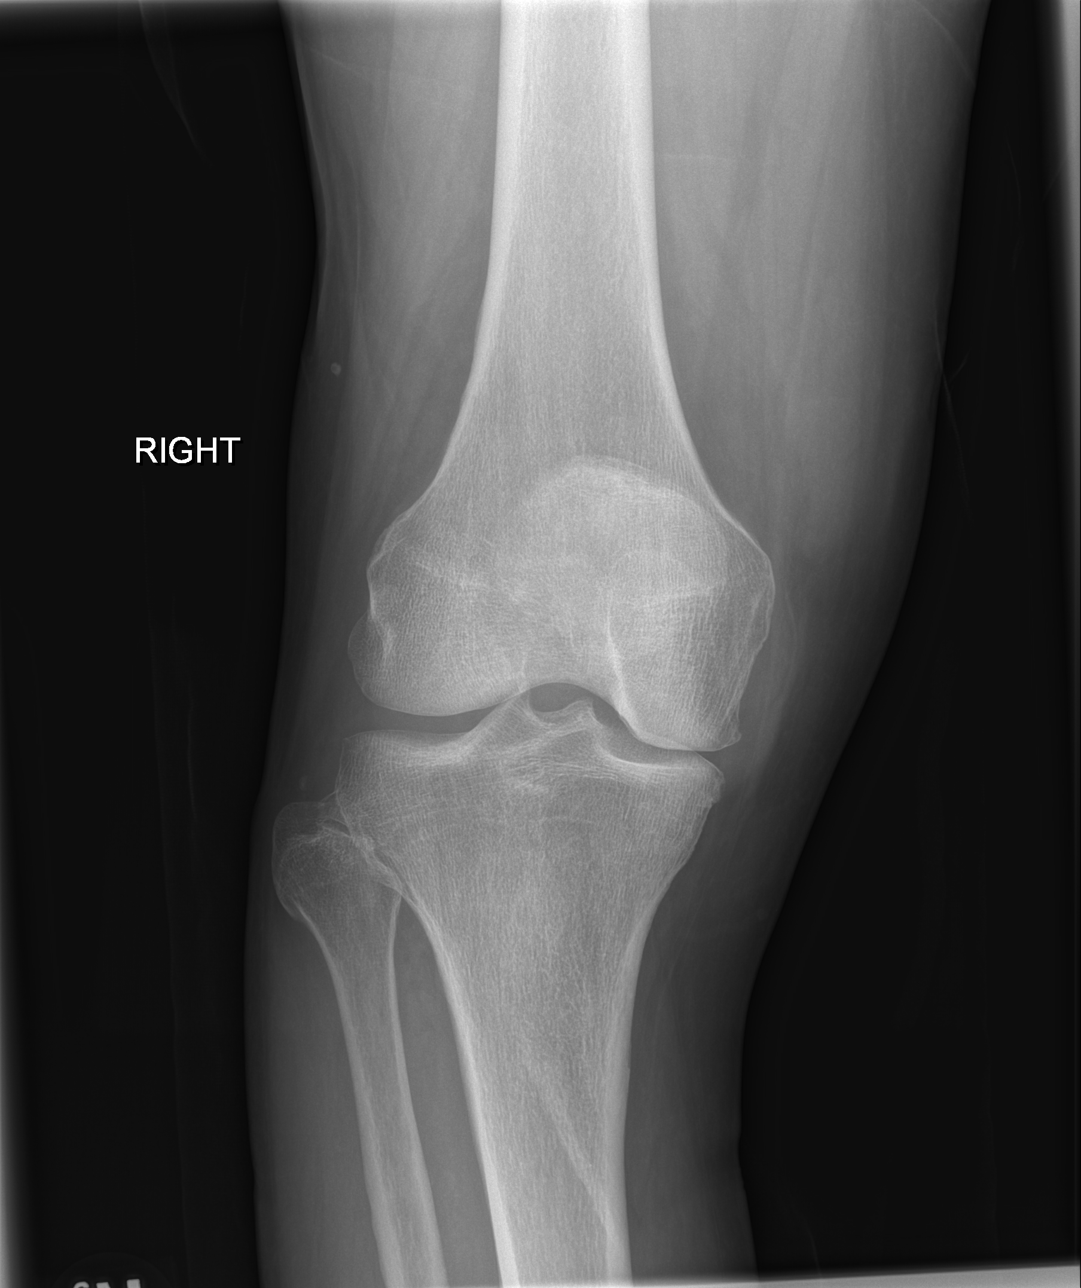

[t knee lat right]
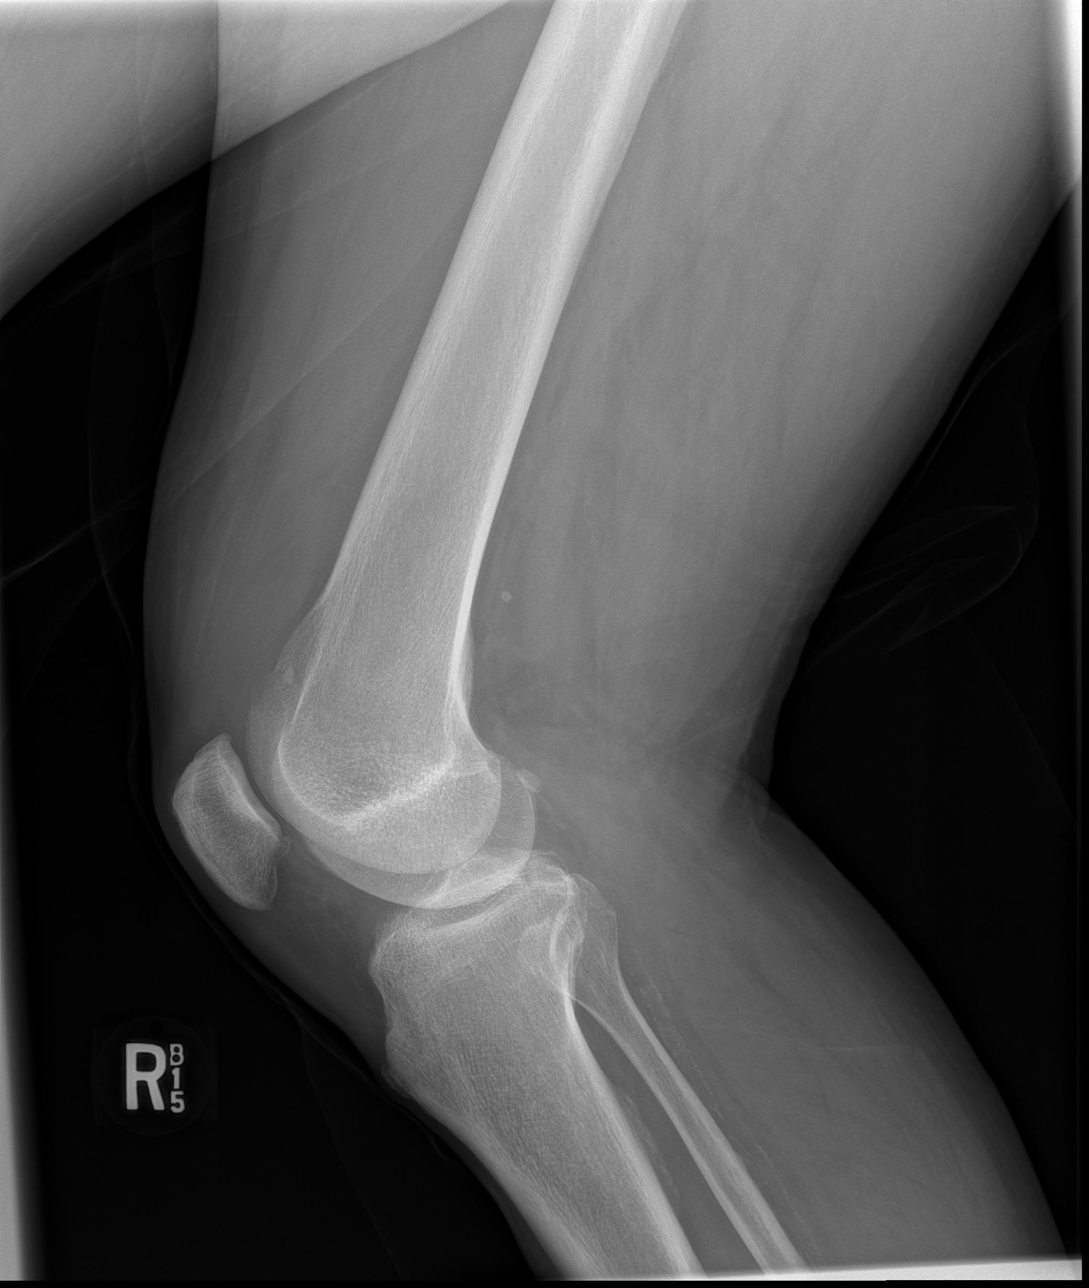

[3 of 3 positions shown; findings below may reference images not displayed]

FINDINGS: Medial joint space narrowing is noted with mild osteophytic change.
No acute fracture or dislocation is noted. No soft tissue
abnormality is noted. Mild patellofemoral degenerative changes are
noted as well.
IMPRESSION: Degenerative change without acute abnormality.

## 2017-11-13 IMAGING — CR DG CHEST 1V
1 series · 1 of 1 positions shown · non-contrast
Comparison: 07/23/2016

CLINICAL DATA: Preop evaluation for upcoming knee replacement

EXAM:
CHEST 1 VIEW

[w chest pa]
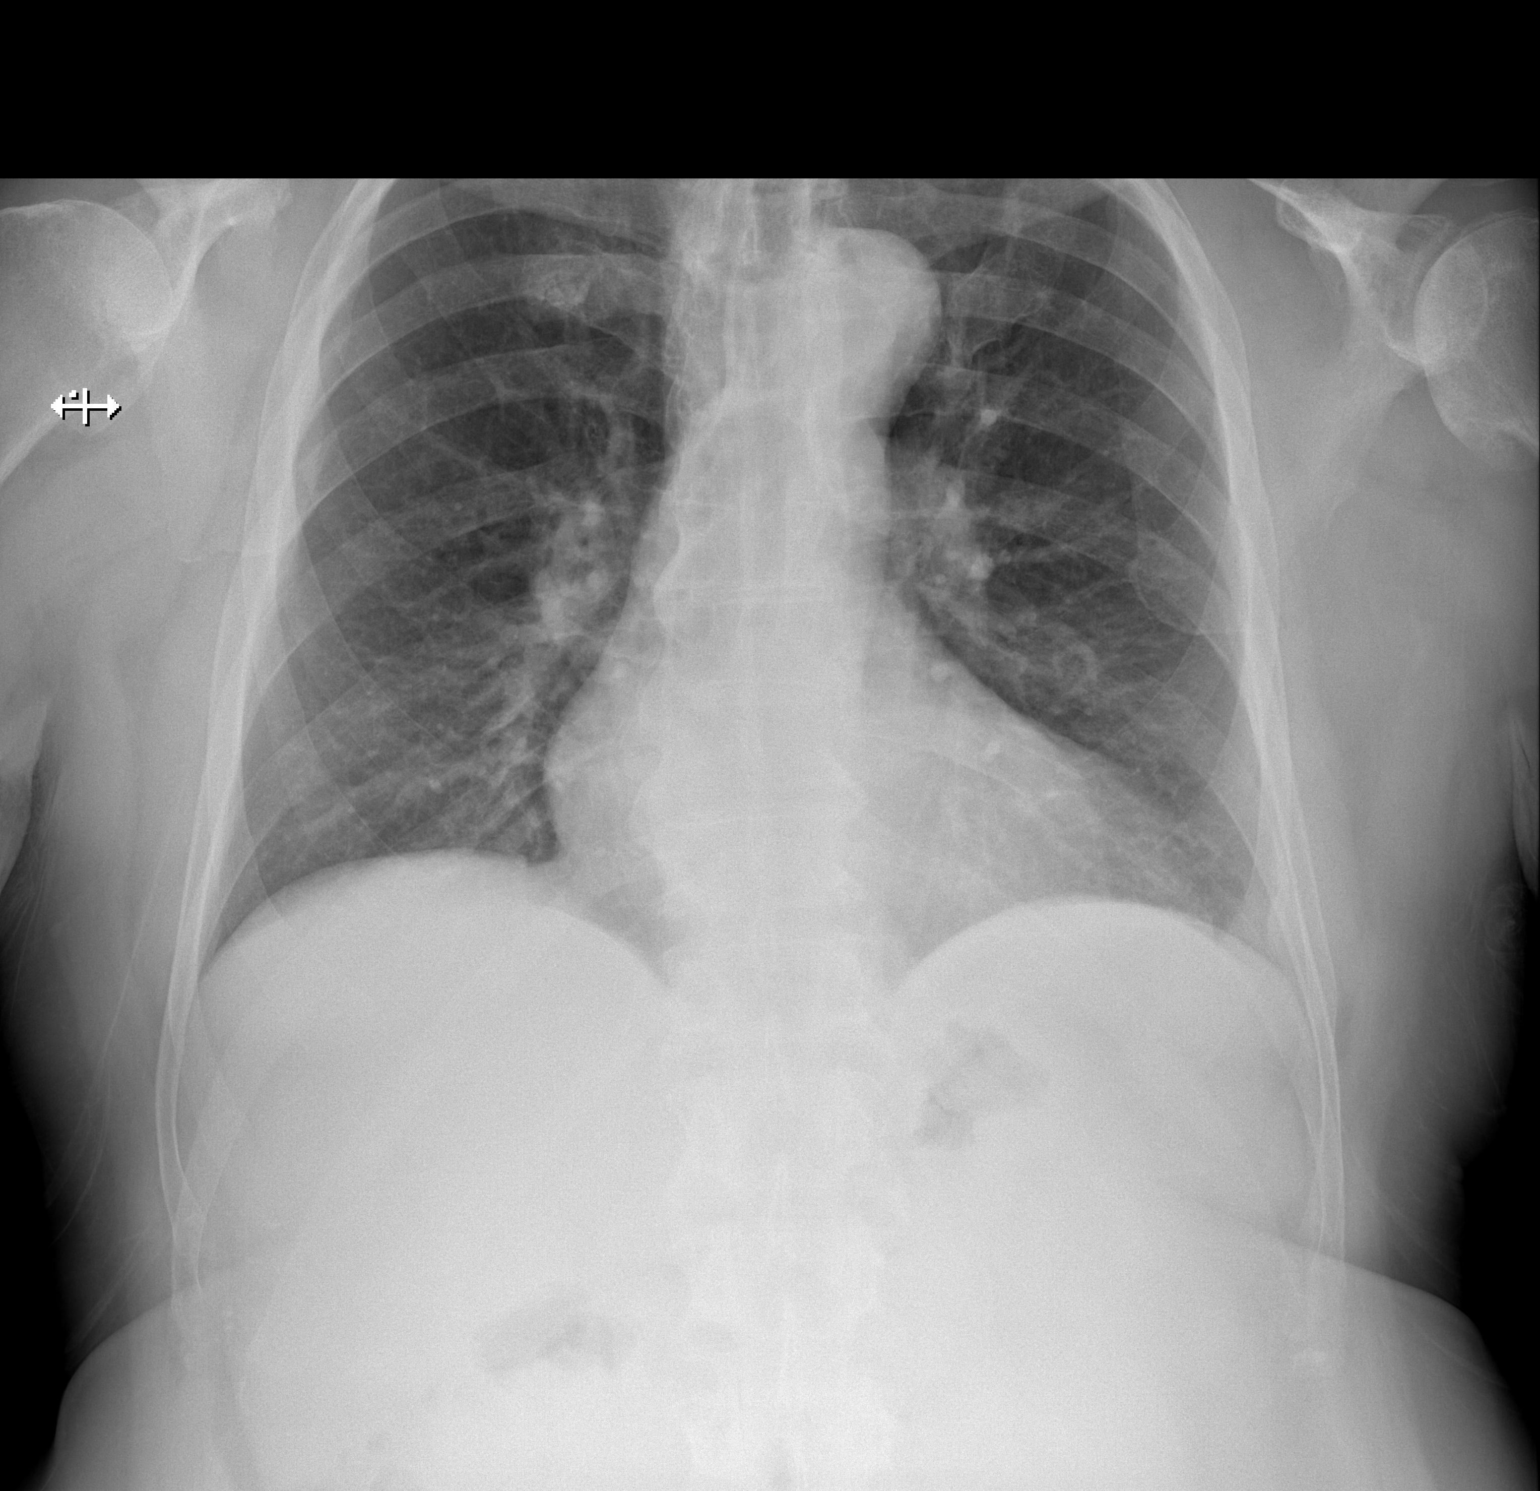

[1 of 1 positions shown; findings below may reference images not displayed]

FINDINGS: Cardiac shadow is stable. Mild aortic calcifications are seen. The
lungs are well aerated bilaterally. No focal infiltrate or sizable
effusion is seen. Degenerative change of the thoracic spine is
noted.
IMPRESSION: No acute abnormality seen.

## 2018-02-13 IMAGING — CT CT SHOULDER*L* W/O CM
1 of 5 series · 3 of 14 positions shown, 4 images · non-contrast
Comparison: None.

CLINICAL DATA: Osteoarthritis of the left shoulder.  Chronic pain.

EXAM:
CT OF THE UPPER LEFT EXTREMITY WITHOUT CONTRAST
TECHNIQUE: Multidetector CT imaging of the upper left extremity was performed
according to the standard protocol.

[Series 200: cor bone · axial · 0.57mm/px · z∈[-69,+76]mm · 3 of 134 slices shown, 4 images]
[im 1/134  soft-tissue]
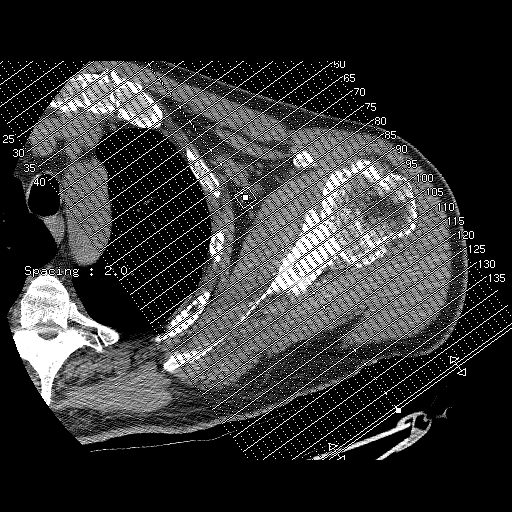
[im 1/134  bone]
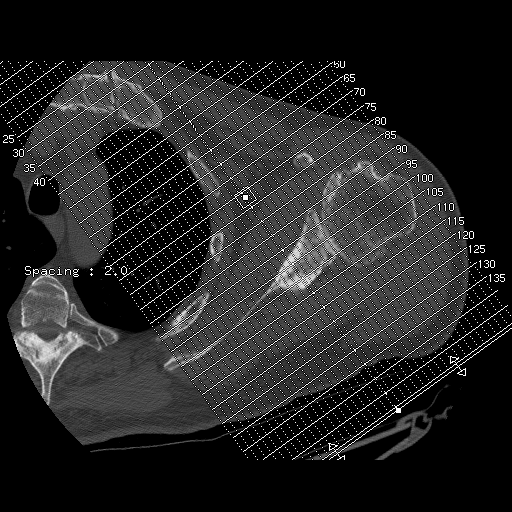
[im 67/134  bone]
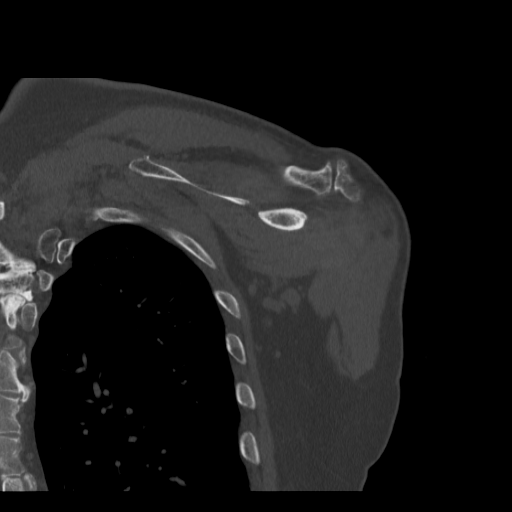
[im 134/134  bone]
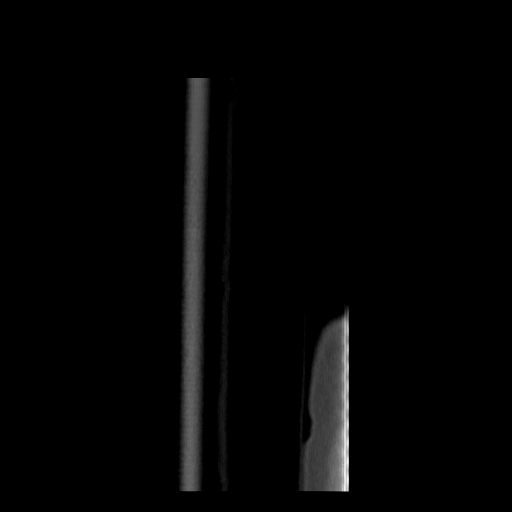

[3 of 14 positions shown; findings below may reference images not displayed]

FINDINGS: Bones/Joint/Cartilage

No fracture or dislocation. Normal alignment. No joint effusion.

Severe joint space narrowing of the left glenohumeral joint with a
bone-on-bone appearance, subchondral sclerosis and mild subchondral
cystic changes. Inferior humeral marginal osteophyte.

Moderate arthropathy of the acromioclavicular joint. Type II
acromion.

Ligaments

Ligaments are suboptimally evaluated by CT.

Muscles and Tendons
Muscles are normal. No muscle atrophy. No intramuscular fluid
collection.

Soft tissue
No fluid collection or hematoma.  No soft tissue mass.
IMPRESSION: 1. Severe osteoarthritis of the left glenohumeral joint.
2. Moderate arthropathy of the acromioclavicular joint.

## 2019-09-06 ENCOUNTER — Other Ambulatory Visit: Payer: Self-pay | Admitting: Nephrology

## 2019-09-06 DIAGNOSIS — N1831 Chronic kidney disease, stage 3a: Secondary | ICD-10-CM

## 2019-10-01 ENCOUNTER — Ambulatory Visit
Admission: RE | Admit: 2019-10-01 | Discharge: 2019-10-01 | Disposition: A | Discharge status: Home / Self Care | Payer: Medicare Other | Source: Ambulatory Visit | Attending: Nephrology | Admitting: Nephrology

## 2019-10-01 ENCOUNTER — Other Ambulatory Visit: Payer: Self-pay | Admitting: Nephrology

## 2019-10-01 DIAGNOSIS — N1831 Chronic kidney disease, stage 3a: Secondary | ICD-10-CM

## 2019-12-09 ENCOUNTER — Other Ambulatory Visit: Payer: Self-pay | Admitting: Urology

## 2020-01-27 ENCOUNTER — Other Ambulatory Visit: Payer: Self-pay | Admitting: Orthopaedic Surgery

## 2020-01-27 ENCOUNTER — Other Ambulatory Visit (HOSPITAL_COMMUNITY): Payer: Self-pay | Admitting: Orthopaedic Surgery

## 2020-01-27 DIAGNOSIS — M25551 Pain in right hip: Secondary | ICD-10-CM

## 2020-02-04 ENCOUNTER — Ambulatory Visit (HOSPITAL_COMMUNITY): Payer: Medicare Other

## 2020-02-04 ENCOUNTER — Encounter (HOSPITAL_COMMUNITY): Payer: Medicare Other

## 2020-02-13 ENCOUNTER — Encounter (HOSPITAL_COMMUNITY): Payer: Medicare Other

## 2020-02-13 ENCOUNTER — Encounter (HOSPITAL_COMMUNITY): Payer: Self-pay

## 2020-02-13 ENCOUNTER — Other Ambulatory Visit (HOSPITAL_COMMUNITY): Payer: Medicare Other

## 2020-02-20 ENCOUNTER — Encounter (HOSPITAL_COMMUNITY)
Admission: RE | Admit: 2020-02-20 | Discharge: 2020-02-20 | Disposition: A | Discharge status: Home / Self Care | Payer: Medicare Other | Source: Ambulatory Visit | Attending: Orthopaedic Surgery | Admitting: Orthopaedic Surgery

## 2020-02-20 ENCOUNTER — Other Ambulatory Visit: Payer: Self-pay

## 2020-02-20 DIAGNOSIS — M25551 Pain in right hip: Secondary | ICD-10-CM | POA: Insufficient documentation

## 2020-02-20 MED ORDER — TECHNETIUM TC 99M MEDRONATE IV KIT
20.0000 | PACK | Freq: Once | INTRAVENOUS | Status: AC | PRN
  Administered 2020-02-20: 20 via INTRAVENOUS

## 2020-03-11 ENCOUNTER — Other Ambulatory Visit: Payer: Self-pay | Admitting: Orthopedic Surgery

## 2020-03-11 DIAGNOSIS — M25551 Pain in right hip: Secondary | ICD-10-CM

## 2020-03-28 ENCOUNTER — Ambulatory Visit
Admission: RE | Admit: 2020-03-28 | Discharge: 2020-03-28 | Disposition: A | Discharge status: Home / Self Care | Payer: Medicare Other | Source: Ambulatory Visit | Attending: Orthopedic Surgery | Admitting: Orthopedic Surgery

## 2020-03-28 DIAGNOSIS — M25551 Pain in right hip: Secondary | ICD-10-CM

## 2020-04-13 ENCOUNTER — Other Ambulatory Visit: Payer: Self-pay | Admitting: Orthopedic Surgery

## 2020-04-13 DIAGNOSIS — Z01818 Encounter for other preprocedural examination: Secondary | ICD-10-CM

## 2020-04-27 NOTE — Patient Instructions (Addendum)
DUE TO COVID-19 ONLY ONE VISITOR IS ALLOWED TO COME WITH YOU AND STAY IN THE WAITING ROOM ONLY DURING PRE OP AND PROCEDURE DAY OF SURGERY. THE 1 VISITOR  MAY VISIT WITH YOU AFTER SURGERY IN YOUR PRIVATE ROOM DURING VISITING HOURS ONLY!  YOU NEED TO HAVE A COVID 19 TEST ON_1/20______ @__10 :05_____, THIS TEST MUST BE DONE BEFORE SURGERY,  COVID TESTING SITE [address] [place] , IT IS ON THE RIGHT GOING OUT [address] APPROXIMATELY  2 MINUTES PAST [employer] ON THE RIGHT. ONCE YOUR COVID TEST IS COMPLETED,  PLEASE BEGIN THE QUARANTINE INSTRUCTIONS AS OUTLINED IN YOUR HANDOUT.                BENJAMIM HARNISH    Your procedure is scheduled on: 05/04/20   Report to [facility] Main  Entrance   Report to Short stay at 5:30 AM     Call this number if you have problems the morning of surgery [phone]    Midnight. BRUSH YOUR TEETH MORNING OF SURGERY AND RINSE YOUR MOUTH OUT, NO CHEWING GUM CANDY OR MINTS.   No food after midnight.    You may have clear liquid until 4:30 AM.    At 4:30 AM drink pre surgery drink.   Nothing by mouth after 4:30 AM.    Take these medicines the morning of surgery with A SIP OF WATER: Metoprolol, Tamsulosin, Omeprazole                                  You may not have any metal on your body including               piercings  Do not wear jewelry, lotions, powders or deodorant                          Men may shave face and neck.   Do not bring valuables to the hospital. [facility] IS NOT             RESPONSIBLE   FOR VALUABLES.  Contacts, dentures or bridgework may not be worn into surgery.       Patients discharged the day of surgery will not be allowed to drive home.   IF YOU ARE HAVING SURGERY AND GOING HOME THE SAME DAY, YOU MUST HAVE AN ADULT TO DRIVE YOU HOME AND BE WITH YOU FOR 24 HOURS.   YOU MAY GO HOME BY TAXI OR UBER OR ORTHERWISE, BUT AN ADULT MUST ACCOMPANY YOU HOME AND STAY WITH YOU FOR  24 HOURS.  Name and phone number of your driver:  Special Instructions: N/A              Please read over the following fact sheets you were given: _____________________________________________________________________             [facility] - Preparing for Surgery Before surgery, you can play an important role.   Because skin is not sterile, your skin needs to be as free of germs as possible.   You can reduce the number of germs on your skin by washing with CHG (chlorahexidine gluconate) soap before surgery.   CHG is an antiseptic cleaner which kills germs and bonds with the skin to continue killing germs even after washing. Please DO NOT use if you have an allergy to CHG or antibacterial soaps.  If your skin becomes reddened/irritated stop using the CHG and inform your nurse when you arrive at Short Stay. .  You may shave your face/neck. Please follow these instructions carefully:  1.  Shower with CHG Soap the night before surgery and the  morning of Surgery.  2.  If you choose to wash your hair, wash your hair first as usual with your  normal  shampoo.  3.  After you shampoo, rinse your hair and body thoroughly to remove the  shampoo.                                        4.  Use CHG as you would any other liquid soap.  You can apply chg directly  to the skin and wash                       Gently with a scrungie or clean washcloth.  5.  Apply the CHG Soap to your body ONLY FROM THE NECK DOWN.   Do not use on face/ open                           Wound or open sores. Avoid contact with eyes, ears mouth and genitals (private parts).                       Wash face,  Genitals (private parts) with your normal soap.             6.  Wash thoroughly, paying special attention to the area where your surgery  will be performed.  7.  Thoroughly rinse your body with warm water from the neck down.  8.  DO NOT shower/wash with your normal soap after using and rinsing off  the CHG Soap.              9.  Pat yourself dry with a clean towel.            10.  Wear clean pajamas.            11.  Place clean sheets on your bed the night of your first shower and do not  sleep with pets. Day of Surgery : Do not apply any lotions/deodorants the morning of surgery.  Please wear clean clothes to the hospital/surgery center.  FAILURE TO FOLLOW THESE INSTRUCTIONS MAY RESULT IN THE CANCELLATION OF YOUR SURGERY PATIENT SIGNATURE_________________________________  NURSE SIGNATURE__________________________________  ________________________________________________________________________   Rogelia Mire  An incentive spirometer is a tool that can help keep your lungs clear and active. This tool measures how well you are filling your lungs with each breath. Taking long deep breaths may help reverse or decrease the chance of developing breathing (pulmonary) problems (especially infection) following:  A long period of time when you are unable to move or be active. BEFORE THE PROCEDURE   If the spirometer includes an indicator to show your best effort, your nurse or respiratory therapist will set it to a desired goal.  If possible, sit up straight or lean slightly forward. Try not to slouch.  Hold the incentive spirometer in an upright position. INSTRUCTIONS FOR USE  1. Sit on the edge of your bed if possible, or sit up as far as you can in bed or on a chair. 2. Hold the incentive spirometer in  an upright position. 3. Breathe out normally. 4. Place the mouthpiece in your mouth and seal your lips tightly around it. 5. Breathe in slowly and as deeply as possible, raising the piston or the ball toward the top of the column. 6. Hold your breath for 3-5 seconds or for as long as possible. Allow the piston or ball to fall to the bottom of the column. 7. Remove the mouthpiece from your mouth and breathe out normally. 8. Rest for a few seconds and repeat Steps 1 through 7 at least 10 times every 1-2  hours when you are awake. Take your time and take a few normal breaths between deep breaths. 9. The spirometer may include an indicator to show your best effort. Use the indicator as a goal to work toward during each repetition. 10. After each set of 10 deep breaths, practice coughing to be sure your lungs are clear. If you have an incision (the cut made at the time of surgery), support your incision when coughing by placing a pillow or rolled up towels firmly against it. Once you are able to get out of bed, walk around indoors and cough well. You may stop using the incentive spirometer when instructed by your caregiver.  RISKS AND COMPLICATIONS  Take your time so you do not get dizzy or light-headed.  If you are in pain, you may need to take or ask for pain medication before doing incentive spirometry. It is harder to take a deep breath if you are having pain. AFTER USE  Rest and breathe slowly and easily.  It can be helpful to keep track of a log of your progress. Your caregiver can provide you with a simple table to help with this. If you are using the spirometer at home, follow these instructions: SEEK MEDICAL CARE IF:   You are having difficultly using the spirometer.  You have trouble using the spirometer as often as instructed.  Your pain medication is not giving enough relief while using the spirometer.  You develop fever of 100.5 F (38.1 C) or higher. SEEK IMMEDIATE MEDICAL CARE IF:   You cough up bloody sputum that had not been present before.  You develop fever of 102 F (38.9 C) or greater.  You develop worsening pain at or near the incision site. MAKE SURE YOU:   Understand these instructions.  Will watch your condition.  Will get help right away if you are not doing well or get worse. Document Released: 08/08/2006 Document Revised: 06/20/2011 Document Reviewed: 10/09/2006 [facility] Patient Information 2014 [place],  [place].   ________________________________________________________________________

## 2020-04-29 ENCOUNTER — Encounter (HOSPITAL_COMMUNITY)
Admission: RE | Admit: 2020-04-29 | Discharge: 2020-04-29 | Disposition: A | Discharge status: Home / Self Care | Payer: Medicare Other | Source: Ambulatory Visit | Attending: Orthopedic Surgery | Admitting: Orthopedic Surgery

## 2020-04-29 ENCOUNTER — Other Ambulatory Visit: Payer: Self-pay

## 2020-04-29 ENCOUNTER — Ambulatory Visit (HOSPITAL_COMMUNITY)
Admission: RE | Admit: 2020-04-29 | Discharge: 2020-04-29 | Disposition: A | Discharge status: Home / Self Care | Payer: Medicare Other | Source: Ambulatory Visit | Attending: Orthopedic Surgery | Admitting: Orthopedic Surgery

## 2020-04-29 ENCOUNTER — Encounter (HOSPITAL_COMMUNITY): Payer: Self-pay

## 2020-04-29 DIAGNOSIS — Z87891 Personal history of nicotine dependence: Secondary | ICD-10-CM | POA: Diagnosis not present

## 2020-04-29 DIAGNOSIS — K219 Gastro-esophageal reflux disease without esophagitis: Secondary | ICD-10-CM | POA: Insufficient documentation

## 2020-04-29 DIAGNOSIS — Z79899 Other long term (current) drug therapy: Secondary | ICD-10-CM | POA: Diagnosis not present

## 2020-04-29 DIAGNOSIS — T84018A Broken internal joint prosthesis, other site, initial encounter: Secondary | ICD-10-CM

## 2020-04-29 DIAGNOSIS — Z01818 Encounter for other preprocedural examination: Secondary | ICD-10-CM

## 2020-04-29 DIAGNOSIS — M25551 Pain in right hip: Secondary | ICD-10-CM | POA: Diagnosis not present

## 2020-04-29 DIAGNOSIS — N189 Chronic kidney disease, unspecified: Secondary | ICD-10-CM | POA: Insufficient documentation

## 2020-04-29 LAB — SURGICAL PCR SCREEN
MRSA, PCR: NEGATIVE
Staphylococcus aureus: NEGATIVE

## 2020-04-29 LAB — URINALYSIS, ROUTINE W REFLEX MICROSCOPIC
Bacteria, UA: NONE SEEN
Bilirubin Urine: NEGATIVE
Glucose, UA: NEGATIVE mg/dL
Ketones, ur: NEGATIVE mg/dL
Leukocytes,Ua: NEGATIVE
Nitrite: NEGATIVE
Protein, ur: NEGATIVE mg/dL
Specific Gravity, Urine: 1.016 (ref 1.005–1.030)
pH: 6 (ref 5.0–8.0)

## 2020-04-29 LAB — BASIC METABOLIC PANEL
Anion gap: 10 (ref 5–15)
BUN: 21 mg/dL (ref 8–23)
CO2: 25 mmol/L (ref 22–32)
Calcium: 9.6 mg/dL (ref 8.9–10.3)
Chloride: 104 mmol/L (ref 98–111)
Creatinine, Ser: 1.31 mg/dL — ABNORMAL HIGH (ref 0.61–1.24)
GFR, Estimated: 58 mL/min — ABNORMAL LOW (ref >60)
Glucose, Bld: 117 mg/dL — ABNORMAL HIGH (ref 70–99)
Potassium: 4.1 mmol/L (ref 3.5–5.1)
Sodium: 139 mmol/L (ref 135–145)

## 2020-04-29 LAB — CBC WITH DIFFERENTIAL/PLATELET
Abs Immature Granulocytes: 0.03 10*3/uL (ref 0.00–0.07)
Basophils Absolute: 0 10*3/uL (ref 0.0–0.1)
Basophils Relative: 0 %
Eosinophils Absolute: 0.2 10*3/uL (ref 0.0–0.5)
Eosinophils Relative: 3 %
HCT: 42.1 % (ref 39.0–52.0)
Hemoglobin: 13.8 g/dL (ref 13.0–17.0)
Immature Granulocytes: 1 %
Lymphocytes Relative: 17 %
Lymphs Abs: 1 10*3/uL (ref 0.7–4.0)
MCH: 31.1 pg (ref 26.0–34.0)
MCHC: 32.8 g/dL (ref 30.0–36.0)
MCV: 94.8 fL (ref 80.0–100.0)
Monocytes Absolute: 0.6 10*3/uL (ref 0.1–1.0)
Monocytes Relative: 11 %
Neutro Abs: 3.9 10*3/uL (ref 1.7–7.7)
Neutrophils Relative %: 68 %
Platelets: 193 10*3/uL (ref 150–400)
RBC: 4.44 MIL/uL (ref 4.22–5.81)
RDW: 13 % (ref 11.5–15.5)
WBC: 5.7 10*3/uL (ref 4.0–10.5)
nRBC: 0 % (ref 0.0–0.2)

## 2020-04-29 LAB — APTT: aPTT: 29 seconds (ref 24–36)

## 2020-04-29 LAB — PROTIME-INR
INR: 1.7 — ABNORMAL HIGH (ref 0.8–1.2)
Prothrombin Time: 19.3 seconds — ABNORMAL HIGH (ref 11.4–15.2)

## 2020-04-29 NOTE — Progress Notes (Signed)
COVID Vaccine Completed:yes Date COVID Vaccine completed:05/21/19- booster 01/28/20 COVID vaccine manufacturer: Pfizer     PCP - Dr. Ginette Otto Cardiologist - none  Chest x-ray - no EKG - 04/16/20-epic Stress Test - no ECHO - no Cardiac Cath - no Pacemaker/ICD device last checked:NA  Sleep Study - no CPAP -   Fasting Blood Sugar - NA Checks Blood Sugar _____ times a day  Blood Thinner Instructions:NA Aspirin Instructions: Last Dose:  Anesthesia review:   Patient denies shortness of breath, fever, cough and chest pain at PAT appointment yes  Patient verbalized understanding of instructions that were given to them at the PAT appointment. Patient was also instructed that they will need to review over the PAT instructions again at home before surgery. Yes. Pt doesn't climb stairs but reports no SOB with housework or ADLs

## 2020-04-29 NOTE — H&P (Signed)
TOTAL HIP REVISION ADMISSION H&P  Patient is admitted for right revision total hip arthroplasty.  Subjective:  Chief Complaint: right hip pain  HPI: Raymond Graham, 70 y.o. 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 The indications for the revision total hip arthroplasty are bearing surface wear leading to  symptomatic synovitis.  Onset of symptoms was gradual starting several years ago with gradually worsening course since that time.  Prior procedures on the right hip include arthroplasty.  Patient currently rates pain in the right hip at 10 out of 10 with activity.  There is night pain, worsening of pain with activity and weight bearing, trendelenberg gait, pain that interfers with activities of daily living and pain with passive range of motion. 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  This condition presents safety issues increasing the risk of falls.    There is no current active infection.  Patient Active Problem List   Diagnosis Date Noted  . 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 S/P shoulder replacement, left 05/18/2017  . Primary localized osteoarthritis of right knee 02/07/2017  . Primary osteoarthritis of right knee 02/07/2017  . Perforated chronic peptic ulcer (HCC) 07/23/2016  . 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 S/P left THA, AA 05/31/2011   Past Medical History:  Diagnosis Date  . Arthritis    PAIN AND ARTHRITIS LEFT HIP AND LEFT KNEE osteoarthritis  . BPH (benign prostatic hypertrophy)    PT ON  FLOMAX-DR. WRENN IS PT'S UROLOGIST  . GERD (gastroesophageal reflux disease)    OCAS-NO MEDS  . Hypertension   . Seasonal allergies     Past Surgical History:  Procedure Laterality Date  . BACK SURGERY  10/2008   LUMBAR SURGERY FOR STENOSIS  . COLONOSCOPY    . JOINT REPLACEMENT  2005   RIGHT HIP REPLACEMENT   . LAPAROTOMY N/A 07/23/2016   Procedure: EXPLORATORY LAPAROTOMY WITH [facility] REPAIR OF PYLORIC ULCER/HERNIA;  Surgeon: Almond Lint, MD;  Location: [facility] OR;  Service: General;  Laterality: N/A;  . LEFT BUNIONECTOMY    . LEFT GANGLION CYST REMOVED    . TOTAL HIP ARTHROPLASTY  05/31/2011   Procedure: TOTAL HIP ARTHROPLASTY ANTERIOR APPROACH;  (BILATERAL)Surgeon: Shelda Pal, MD;  Location: [facility] ORS;  Service: Orthopedics;  Laterality: Left;  . TOTAL KNEE ARTHROPLASTY  11/21/2011   Procedure: TOTAL KNEE ARTHROPLASTY;  Surgeon: Shelda Pal, MD;  Location: [facility];  Service: Orthopedics;  Laterality: Left;  . TOTAL KNEE ARTHROPLASTY Right 02/07/2017   Procedure: TOTAL KNEE ARTHROPLASTY;  Surgeon: Marcene Corning, MD;  Location: [facility] OR;  Service: Orthopedics;  Laterality: Right;  . TOTAL SHOULDER ARTHROPLASTY Left 05/18/2017  . TOTAL SHOULDER ARTHROPLASTY Left 05/18/2017   Procedure: LEFT TOTAL SHOULDER ARTHROPLASTY;  Surgeon: Jones Broom, MD;  Location: [facility] OR;  Service: Orthopedics;  Laterality: Left;    No current facility-administered medications for this encounter.   Current Outpatient Medications  Medication Sig Dispense Refill Last Dose  . hydrochlorothiazide (HYDRODIURIL) 25 MG tablet Take 25 mg by mouth daily.     . metoprolol (TOPROL-XL) 200 MG 24 hr tablet Take 200  mg by mouth daily.     . Multiple Vitamin (MULTIVITAMIN WITH MINERALS) TABS Take 1 tablet by mouth daily.     Marland Kitchen olmesartan (BENICAR) 20 MG tablet Take 20 mg by mouth daily.     Marland Kitchen omeprazole (PRILOSEC) 20 MG capsule Take 20 mg by mouth daily as needed (acid reflux).     . psyllium (METAMUCIL) 0.52 g capsule  Take 2 capsules by mouth daily.     . rosuvastatin (CRESTOR) 10 MG tablet Take 10 mg by mouth daily.     . tamsulosin (FLOMAX) 0.4 MG CAPS capsule TAKE 1 CAPSULE BY MOUTH  DAILY (Patient taking differently: Take 0.4 mg by mouth daily.) 90 capsule 3   . traZODone (DESYREL) 150 MG tablet Take 150 mg by mouth at bedtime as needed for sleep.      No Known Allergies  Social History   Tobacco Use  . Smoking status: Former Smoker    Quit date: 04/12/1991    Years since quitting: 29.0  . Smokeless tobacco: Never Used  . Tobacco comment: QUIT SMOKING ABOUT 15 OR 20 YRS AGO-ONLY SMOKED ON WEEKENDS  Substance Use Topics  . Alcohol use: Yes    Alcohol/week: 2.0 standard drinks    Types: 2 Shots of liquor per week    Comment: occasionally    No family history on file.    Review of Systems  Constitutional: Negative.   HENT: Negative.   Eyes: Negative.   Respiratory: Negative.   Cardiovascular:       HTN  Gastrointestinal: Negative.   Endocrine: Negative.   Genitourinary: Positive for dysuria and frequency.       Enlarged prostate  Musculoskeletal: Positive for arthralgias.  Skin: Negative.   Allergic/Immunologic: Negative.   Neurological: Negative.   Hematological: Negative.   Psychiatric/Behavioral: Negative.     Objective:  Physical Exam Constitutional:      Appearance: Normal appearance. He is normal weight.  HENT:     Head: Normocephalic and atraumatic.     Nose: Nose normal.  Eyes:     Pupils: Pupils are equal, round, and reactive to light.  Cardiovascular:     Pulses: Normal pulses.  Pulmonary:     Effort: Pulmonary effort is normal.  Musculoskeletal:     Cervical back: Normal range of motion and neck supple.     Comments: He continues to walk with a mild right-sided limp.  Otherwise good strength in bilateral lower extremities.  Calves are soft and nontender.  Skin:    General: Skin is warm and dry.  Neurological:     General: No focal deficit present.     Mental  Status: He is alert and oriented to person, place, and time. Mental status is at baseline.  Psychiatric:        Mood and Affect: Mood normal.        Behavior: Behavior normal.        Thought Content: Thought content normal.        Judgment: Judgment normal.     Vital signs in last 24 hours: Temp:  [98.1 F (36.7 C)] 98.1 F (36.7 C) (01/19 0902) Pulse Rate:  [66] 66 (01/19 0902) Resp:  [20] 20 (01/19 0902) BP: (154)/(94) 154/94 (01/19 0902) SpO2:  [100 %] 100 % (01/19 0902) Weight:  [101.6 kg] 101.6 kg (01/19 0902)   Labs:   Estimated body mass index is 34.06 kg/m as calculated from the following:   Height as of 04/29/20: 5\' 8"  (1.727 m).  Weight as of 04/29/20: 101.6 kg.  Imaging Review:  Comparing the x-rays of 2018 to the x-rays of 2021 there appears to be some lysis of the calcar although I believe the stem is well fixed with spot welding along the inferior shoulders of the S-ROM sleeve.    [facility] MRI scan  does show that he has a fluid collection with metal artifact and ostial lysis along the greater trochanteric region.    Assessment/Plan:  End stage arthritis, right hip(s) with failed previous arthroplasty.  The patient history, physical examination, clinical judgement of the provider and imaging studies are consistent with end stage degenerative joint disease of the right hip(s), previous total hip arthroplasty. Revision total hip arthroplasty is deemed medically necessary. The treatment options including medical management, injection therapy, arthroscopy and arthroplasty were discussed at length. The risks and benefits of total hip arthroplasty were presented and reviewed. The risks due to aseptic loosening, infection, stiffness, dislocation/subluxation,  thromboembolic complications and other imponderables were discussed.  The patient acknowledged the explanation, agreed to proceed with the plan and consent was signed. Patient is being admitted for inpatient treatment for  surgery, pain control, PT, OT, prophylactic antibiotics, VTE prophylaxis, progressive ambulation and ADL's and discharge planning. The patient is planning to be discharged home with home health services

## 2020-04-30 ENCOUNTER — Other Ambulatory Visit (HOSPITAL_COMMUNITY)
Admission: RE | Admit: 2020-04-30 | Discharge: 2020-04-30 | Disposition: A | Discharge status: Home / Self Care | Payer: Medicare Other | Source: Ambulatory Visit | Attending: Orthopedic Surgery | Admitting: Orthopedic Surgery

## 2020-04-30 DIAGNOSIS — Z01812 Encounter for preprocedural laboratory examination: Secondary | ICD-10-CM | POA: Insufficient documentation

## 2020-04-30 DIAGNOSIS — Z20822 Contact with and (suspected) exposure to covid-19: Secondary | ICD-10-CM | POA: Insufficient documentation

## 2020-04-30 LAB — SARS CORONAVIRUS 2 (TAT 6-24 HRS): SARS Coronavirus 2: NEGATIVE

## 2020-04-30 NOTE — Progress Notes (Signed)
Anesthesia Chart Review   Case: [id number] Date/Time: 05/04/20 0700   Procedure: RIGHT TOTAL HIP REVISION OF BEARING POSTERIOR APPROACH (Right Hip)   Anesthesia type: Spinal   Pre-op diagnosis: RIGHT TOTAL HIP PAIN   [facility] ROOM 08 / [facility]   Surgeons: Gean Birchwood, MD      DISCUSSION:70 y.o. former smoker with h/o HTN, GERD, CKD (follows with [facility]), right total hip pain scheduled for above procedure 05/04/20 with Dr. Gean Birchwood.   Per previous anesthesia chart review note 05/16/2017, "PCP is listed as Dr. Quitman Livings [facility] in [place]). Patient is not routinely followed by a cardiologist, but he was referred to Dr. Yates Decamp in 2012 prior to undergoing 2013 hip and knee replacement surgeries and had a negative exercise stress test."  "Historically, patient has had a chronically elevated PT/INR since 2013. Unclear etiology--he is not on warfarin. This was previously discussed with anesthesia 01/2017. At this point, I have not placed an order to repeat PT/INR since multiple lab results are consistent with 05/16/16 results." Forwarded to Dr. Turner Daniels.  Anticipate pt can proceed with planned procedure barring acute status change.   VS: BP (!) 154/94   Pulse 66   Temp 36.7 C (Oral)   Resp 20   Ht 5\' 8"  (1.727 m)   Wt 101.6 kg   SpO2 100%   BMI 34.06 kg/m   PROVIDERS: , MD is PCP    LABS: Labs reviewed: Acceptable for surgery. (all labs ordered are listed, but only abnormal results are displayed)  Labs Reviewed  BASIC METABOLIC PANEL - Abnormal; Notable for the following components:      Result Value   Glucose, Bld 117 (*)    Creatinine, Ser 1.31 (*)    GFR, Estimated 58 (*)    All other components within normal limits  PROTIME-INR - Abnormal; Notable for the following components:   Prothrombin Time 19.3 (*)    INR 1.7 (*)    All other components within normal limits  URINALYSIS, ROUTINE W REFLEX MICROSCOPIC - Abnormal; Notable  for the following components:   Hgb urine dipstick SMALL (*)    All other components within normal limits  SURGICAL PCR SCREEN  CBC WITH DIFFERENTIAL/PLATELET  APTT  TYPE AND SCREEN     IMAGES:   EKG: DOS  CV:  Past Medical History:  Diagnosis Date  . Arthritis    PAIN AND ARTHRITIS LEFT HIP AND LEFT KNEE osteoarthritis  . BPH (benign prostatic hypertrophy)    PT ON FLOMAX-DR. WRENN IS PT'S UROLOGIST  . GERD (gastroesophageal reflux disease)    OCAS-NO MEDS  . Hypertension   . Seasonal allergies     Past Surgical History:  Procedure Laterality Date  . BACK SURGERY  10/2008   LUMBAR SURGERY FOR STENOSIS  . COLONOSCOPY    . JOINT REPLACEMENT  2005   RIGHT HIP REPLACEMENT   . LAPAROTOMY N/A 07/23/2016   Procedure: EXPLORATORY LAPAROTOMY WITH [facility] REPAIR OF PYLORIC ULCER/HERNIA;  Surgeon: [facility], MD;  Location: [facility];  Service: General;  Laterality: N/A;  . LEFT BUNIONECTOMY    . LEFT GANGLION CYST REMOVED    . TOTAL HIP ARTHROPLASTY  05/31/2011   Procedure: TOTAL HIP ARTHROPLASTY ANTERIOR APPROACH;  (BILATERAL)Surgeon: 06/02/2011, MD;  [facility];  Service: Orthopedics;  Laterality: Left;  . TOTAL KNEE ARTHROPLASTY  11/21/2011   Procedure: TOTAL KNEE ARTHROPLASTY;  Surgeon: 01/21/2012, MD;  [facility]: [facility];  Service: Orthopedics;  Laterality: Left;  . TOTAL KNEE ARTHROPLASTY Right 02/07/2017   Procedure: TOTAL KNEE ARTHROPLASTY;  Surgeon: Marcene Corning, MD;  Location: [facility];  Service: Orthopedics;  Laterality: Right;  . TOTAL SHOULDER ARTHROPLASTY Left 05/18/2017  . TOTAL SHOULDER ARTHROPLASTY Left 05/18/2017   Procedure: LEFT TOTAL SHOULDER ARTHROPLASTY;  Surgeon: Jones Broom, MD;  Location: [facility];  Service: Orthopedics;  Laterality: Left;    MEDICATIONS: . hydrochlorothiazide (HYDRODIURIL) 25 MG tablet  . metoprolol (TOPROL-XL) 200 MG 24 hr tablet  . Multiple Vitamin (MULTIVITAMIN WITH MINERALS) TABS  . olmesartan (BENICAR) 20 MG  tablet  . omeprazole (PRILOSEC) 20 MG capsule  . psyllium (METAMUCIL) 0.52 g capsule  . rosuvastatin (CRESTOR) 10 MG tablet  . tamsulosin (FLOMAX) 0.4 MG CAPS capsule  . traZODone (DESYREL) 150 MG tablet   No current facility-administered medications for this encounter.    Jodell Cipro, PA-C [facility] [phone]

## 2020-05-03 MED ORDER — TRANEXAMIC ACID 1000 MG/10ML IV SOLN
2000.0000 mg | INTRAVENOUS | Status: DC
  Filled 2020-05-03: qty 20

## 2020-05-03 MED ORDER — BUPIVACAINE LIPOSOME 1.3 % IJ SUSP
10.0000 mL | INTRAMUSCULAR | Status: DC
  Filled 2020-05-03: qty 10

## 2020-05-03 NOTE — Anesthesia Preprocedure Evaluation (Addendum)
Anesthesia Evaluation  Patient identified by MRN, date of birth, ID band Patient awake    Reviewed: Allergy & Precautions, NPO status , Patient's Chart, lab work & pertinent test results, reviewed documented beta blocker date and time   Airway Mallampati: III  TM Distance: >3 FB Neck ROM: Full    Dental no notable dental hx. (+) Teeth Intact   Pulmonary former smoker,    Pulmonary exam normal breath sounds clear to auscultation       Cardiovascular hypertension, Pt. on medications and Pt. on home beta blockers Normal cardiovascular exam Rhythm:Regular Rate:Normal  EKG 05/04/20 NSR, normal   Neuro/Psych PSYCHIATRIC DISORDERS Depression negative neurological ROS     GI/Hepatic Neg liver ROS, PUD, GERD  Medicated and Controlled,  Endo/Other  negative endocrine ROS  Renal/GU negative Renal ROS   BPH    Musculoskeletal  (+) Arthritis , Osteoarthritis,  Right Hip pain S/P Right THR in 2005   Abdominal (+) + obese,   Peds  Hematology negative hematology ROS (+)   Anesthesia Other Findings   Reproductive/Obstetrics                            Anesthesia Physical Anesthesia Plan  ASA: II  Anesthesia Plan: Spinal   Post-op Pain Management:    Induction:   PONV Risk Score and Plan: 2 and Ondansetron, Dexamethasone, Midazolam and Treatment may vary due to age or medical condition  Airway Management Planned: Natural Airway and Simple Face Mask  Additional Equipment: None  Intra-op Plan:   Post-operative Plan:   Informed Consent: I have reviewed the patients History and Physical, chart, labs and discussed the procedure including the risks, benefits and alternatives for the proposed anesthesia with the patient or authorized representative who has indicated his/her understanding and acceptance.     Dental advisory given  Plan Discussed with: CRNA and Anesthesiologist  Anesthesia Plan  Comments:        Anesthesia Quick Evaluation

## 2020-05-04 ENCOUNTER — Encounter (HOSPITAL_COMMUNITY): Payer: Self-pay | Admitting: Orthopedic Surgery

## 2020-05-04 ENCOUNTER — Encounter (HOSPITAL_COMMUNITY)
Admission: RE | Disposition: A | Discharge status: Home / Self Care | Payer: Self-pay | Source: Home / Self Care | Attending: Orthopedic Surgery

## 2020-05-04 ENCOUNTER — Ambulatory Visit (HOSPITAL_COMMUNITY): Payer: Medicare Other | Admitting: Physician Assistant

## 2020-05-04 ENCOUNTER — Ambulatory Visit (HOSPITAL_COMMUNITY)
Admission: RE | Admit: 2020-05-04 | Discharge: 2020-05-04 | Disposition: A | Discharge status: Home / Self Care | Payer: Medicare Other | Attending: Orthopedic Surgery | Admitting: Orthopedic Surgery

## 2020-05-04 ENCOUNTER — Ambulatory Visit (HOSPITAL_COMMUNITY): Payer: Medicare Other | Admitting: Anesthesiology

## 2020-05-04 DIAGNOSIS — T8484XA Pain due to internal orthopedic prosthetic devices, implants and grafts, initial encounter: Secondary | ICD-10-CM | POA: Diagnosis not present

## 2020-05-04 DIAGNOSIS — Z96652 Presence of left artificial knee joint: Secondary | ICD-10-CM | POA: Diagnosis not present

## 2020-05-04 DIAGNOSIS — T84018A Broken internal joint prosthesis, other site, initial encounter: Secondary | ICD-10-CM

## 2020-05-04 DIAGNOSIS — Z79899 Other long term (current) drug therapy: Secondary | ICD-10-CM | POA: Diagnosis not present

## 2020-05-04 DIAGNOSIS — M25451 Effusion, right hip: Secondary | ICD-10-CM | POA: Diagnosis not present

## 2020-05-04 DIAGNOSIS — Z96612 Presence of left artificial shoulder joint: Secondary | ICD-10-CM | POA: Insufficient documentation

## 2020-05-04 DIAGNOSIS — M25551 Pain in right hip: Secondary | ICD-10-CM | POA: Diagnosis present

## 2020-05-04 DIAGNOSIS — Z96641 Presence of right artificial hip joint: Secondary | ICD-10-CM | POA: Diagnosis not present

## 2020-05-04 DIAGNOSIS — Y792 Prosthetic and other implants, materials and accessory orthopedic devices associated with adverse incidents: Secondary | ICD-10-CM | POA: Insufficient documentation

## 2020-05-04 DIAGNOSIS — Z87891 Personal history of nicotine dependence: Secondary | ICD-10-CM | POA: Diagnosis not present

## 2020-05-04 DIAGNOSIS — Z96649 Presence of unspecified artificial hip joint: Secondary | ICD-10-CM

## 2020-05-04 HISTORY — PX: TOTAL HIP REVISION: SHX763

## 2020-05-04 LAB — TYPE AND SCREEN
ABO/RH(D): AB POS
Antibody Screen: NEGATIVE

## 2020-05-04 SURGERY — TOTAL HIP REVISION
Anesthesia: General | Site: Hip | Laterality: Right

## 2020-05-04 MED ORDER — SODIUM CHLORIDE (PF) 0.9 % IJ SOLN
INTRAMUSCULAR | Status: AC
  Filled 2020-05-04: qty 10

## 2020-05-04 MED ORDER — OXYCODONE HCL 5 MG PO TABS: 5.0000 mg | ORAL_TABLET | Freq: Once | ORAL | Status: DC | PRN

## 2020-05-04 MED ORDER — FENTANYL CITRATE (PF) 100 MCG/2ML IJ SOLN
INTRAMUSCULAR | Status: AC
  Filled 2020-05-04: qty 2

## 2020-05-04 MED ORDER — LACTATED RINGERS IV SOLN: INTRAVENOUS | Status: DC

## 2020-05-04 MED ORDER — STERILE WATER FOR IRRIGATION IR SOLN
Status: DC | PRN
  Administered 2020-05-04: 1000 mL

## 2020-05-04 MED ORDER — ONDANSETRON HCL 4 MG/2ML IJ SOLN: 4.0000 mg | Freq: Once | INTRAMUSCULAR | Status: DC | PRN

## 2020-05-04 MED ORDER — OXYCODONE HCL 5 MG PO TABS: 5.0000 mg | ORAL_TABLET | ORAL | 0 refills | Status: AC | PRN

## 2020-05-04 MED ORDER — CEFAZOLIN SODIUM-DEXTROSE 2-4 GM/100ML-% IV SOLN
2.0000 g | INTRAVENOUS | Status: AC
  Administered 2020-05-04: 2 g via INTRAVENOUS
  Filled 2020-05-04: qty 100

## 2020-05-04 MED ORDER — MIDAZOLAM HCL 5 MG/5ML IJ SOLN
INTRAMUSCULAR | Status: DC | PRN
  Administered 2020-05-04: 2 mg via INTRAVENOUS

## 2020-05-04 MED ORDER — TRANEXAMIC ACID-NACL 1000-0.7 MG/100ML-% IV SOLN
1000.0000 mg | Freq: Once | INTRAVENOUS | Status: AC
  Administered 2020-05-04: 1000 mg via INTRAVENOUS

## 2020-05-04 MED ORDER — BUPIVACAINE-EPINEPHRINE (PF) 0.25% -1:200000 IJ SOLN
INTRAMUSCULAR | Status: AC
  Filled 2020-05-04: qty 30

## 2020-05-04 MED ORDER — ONDANSETRON HCL 4 MG/2ML IJ SOLN
INTRAMUSCULAR | Status: AC
  Filled 2020-05-04: qty 2

## 2020-05-04 MED ORDER — MIDAZOLAM HCL 2 MG/2ML IJ SOLN
INTRAMUSCULAR | Status: AC
  Filled 2020-05-04: qty 2

## 2020-05-04 MED ORDER — EPHEDRINE 5 MG/ML INJ
INTRAVENOUS | Status: AC
  Filled 2020-05-04: qty 10

## 2020-05-04 MED ORDER — TRANEXAMIC ACID-NACL 1000-0.7 MG/100ML-% IV SOLN
INTRAVENOUS | Status: AC
  Filled 2020-05-04: qty 100

## 2020-05-04 MED ORDER — OXYCODONE HCL 5 MG PO TABS
ORAL_TABLET | ORAL | Status: AC
  Filled 2020-05-04: qty 1

## 2020-05-04 MED ORDER — HYDROMORPHONE HCL 2 MG/ML IJ SOLN
INTRAMUSCULAR | Status: AC
  Filled 2020-05-04: qty 1

## 2020-05-04 MED ORDER — POVIDONE-IODINE 10 % EX SWAB
2.0000 "application " | Freq: Once | CUTANEOUS | Status: AC
  Administered 2020-05-04: 2 via TOPICAL

## 2020-05-04 MED ORDER — SODIUM CHLORIDE 0.9% FLUSH
INTRAVENOUS | Status: DC | PRN
  Administered 2020-05-04: 30 mL

## 2020-05-04 MED ORDER — ONDANSETRON HCL 4 MG/2ML IJ SOLN
INTRAMUSCULAR | Status: DC | PRN
  Administered 2020-05-04: 4 mg via INTRAVENOUS

## 2020-05-04 MED ORDER — PROPOFOL 10 MG/ML IV BOLUS
INTRAVENOUS | Status: DC | PRN
  Administered 2020-05-04: 160 mg via INTRAVENOUS

## 2020-05-04 MED ORDER — OXYCODONE HCL 5 MG PO TABS
5.0000 mg | ORAL_TABLET | ORAL | Status: DC | PRN
  Administered 2020-05-04: 5 mg via ORAL

## 2020-05-04 MED ORDER — 0.9 % SODIUM CHLORIDE (POUR BTL) OPTIME
TOPICAL | Status: DC | PRN
  Administered 2020-05-04: 1000 mL

## 2020-05-04 MED ORDER — ATROPINE SULFATE 0.4 MG/ML IV SOSY
PREFILLED_SYRINGE | INTRAVENOUS | Status: DC | PRN
  Administered 2020-05-04: .4 mg via INTRAVENOUS

## 2020-05-04 MED ORDER — TRANEXAMIC ACID-NACL 1000-0.7 MG/100ML-% IV SOLN
1000.0000 mg | INTRAVENOUS | Status: AC
  Administered 2020-05-04: 1000 mg via INTRAVENOUS
  Filled 2020-05-04: qty 100

## 2020-05-04 MED ORDER — TIZANIDINE HCL 2 MG PO TABS: 2.0000 mg | ORAL_TABLET | Freq: Three times a day (TID) | ORAL | 0 refills | Status: DC

## 2020-05-04 MED ORDER — OXYCODONE HCL 5 MG/5ML PO SOLN: 5.0000 mg | Freq: Once | ORAL | Status: DC | PRN

## 2020-05-04 MED ORDER — PHENYLEPHRINE 40 MCG/ML (10ML) SYRINGE FOR IV PUSH (FOR BLOOD PRESSURE SUPPORT)
PREFILLED_SYRINGE | INTRAVENOUS | Status: DC | PRN
  Administered 2020-05-04: 160 ug via INTRAVENOUS
  Administered 2020-05-04: 80 ug via INTRAVENOUS

## 2020-05-04 MED ORDER — SODIUM CHLORIDE (PF) 0.9 % IJ SOLN
INTRAMUSCULAR | Status: AC
  Filled 2020-05-04: qty 50

## 2020-05-04 MED ORDER — ORAL CARE MOUTH RINSE: 15.0000 mL | Freq: Once | OROMUCOSAL | Status: AC

## 2020-05-04 MED ORDER — TRANEXAMIC ACID 1000 MG/10ML IV SOLN
INTRAVENOUS | Status: DC | PRN
  Administered 2020-05-04: 2000 mg via TOPICAL

## 2020-05-04 MED ORDER — LACTATED RINGERS IV BOLUS
500.0000 mL | Freq: Once | INTRAVENOUS | Status: AC
  Administered 2020-05-04: 500 mL via INTRAVENOUS

## 2020-05-04 MED ORDER — LACTATED RINGERS IV BOLUS
250.0000 mL | Freq: Once | INTRAVENOUS | Status: AC
  Administered 2020-05-04: 250 mL via INTRAVENOUS

## 2020-05-04 MED ORDER — HYDROMORPHONE HCL 1 MG/ML IJ SOLN
INTRAMUSCULAR | Status: DC | PRN
Start: 2020-05-04 — End: 2020-05-04
  Administered 2020-05-04 (×2): .5 mg via INTRAVENOUS

## 2020-05-04 MED ORDER — EPHEDRINE SULFATE-NACL 50-0.9 MG/10ML-% IV SOSY
PREFILLED_SYRINGE | INTRAVENOUS | Status: DC | PRN
  Administered 2020-05-04 (×2): 15 mg via INTRAVENOUS

## 2020-05-04 MED ORDER — FENTANYL CITRATE (PF) 100 MCG/2ML IJ SOLN
25.0000 ug | INTRAMUSCULAR | Status: DC | PRN
Start: 2020-05-04 — End: 2020-05-04

## 2020-05-04 MED ORDER — BUPIVACAINE LIPOSOME 1.3 % IJ SUSP
INTRAMUSCULAR | Status: DC | PRN
  Administered 2020-05-04: 10 mL

## 2020-05-04 MED ORDER — DEXAMETHASONE SODIUM PHOSPHATE 10 MG/ML IJ SOLN
INTRAMUSCULAR | Status: AC
  Filled 2020-05-04: qty 1

## 2020-05-04 MED ORDER — PHENYLEPHRINE HCL-NACL 10-0.9 MG/250ML-% IV SOLN
INTRAVENOUS | Status: DC | PRN
  Administered 2020-05-04: 50 ug/min via INTRAVENOUS

## 2020-05-04 MED ORDER — BUPIVACAINE-EPINEPHRINE 0.25% -1:200000 IJ SOLN
INTRAMUSCULAR | Status: DC | PRN
  Administered 2020-05-04: 30 mL

## 2020-05-04 MED ORDER — LIDOCAINE 2% (20 MG/ML) 5 ML SYRINGE
INTRAMUSCULAR | Status: DC | PRN
  Administered 2020-05-04: 100 mg via INTRAVENOUS

## 2020-05-04 MED ORDER — SUGAMMADEX SODIUM 500 MG/5ML IV SOLN
INTRAVENOUS | Status: DC | PRN
  Administered 2020-05-04: 250 mg via INTRAVENOUS

## 2020-05-04 MED ORDER — FENTANYL CITRATE (PF) 100 MCG/2ML IJ SOLN
INTRAMUSCULAR | Status: DC | PRN
  Administered 2020-05-04 (×2): 100 ug via INTRAVENOUS

## 2020-05-04 MED ORDER — ROCURONIUM BROMIDE 100 MG/10ML IV SOLN
INTRAVENOUS | Status: DC | PRN
  Administered 2020-05-04: 80 mg via INTRAVENOUS

## 2020-05-04 MED ORDER — PROPOFOL 10 MG/ML IV BOLUS
INTRAVENOUS | Status: AC
  Filled 2020-05-04: qty 20

## 2020-05-04 MED ORDER — PHENYLEPHRINE HCL (PRESSORS) 10 MG/ML IV SOLN
INTRAVENOUS | Status: AC
  Filled 2020-05-04: qty 1

## 2020-05-04 MED ORDER — CHLORHEXIDINE GLUCONATE 0.12 % MT SOLN
15.0000 mL | Freq: Once | OROMUCOSAL | Status: AC
  Administered 2020-05-04: 15 mL via OROMUCOSAL

## 2020-05-04 MED ORDER — PHENYLEPHRINE 40 MCG/ML (10ML) SYRINGE FOR IV PUSH (FOR BLOOD PRESSURE SUPPORT)
PREFILLED_SYRINGE | INTRAVENOUS | Status: AC
  Filled 2020-05-04: qty 10

## 2020-05-04 MED ORDER — DEXAMETHASONE SODIUM PHOSPHATE 10 MG/ML IJ SOLN
INTRAMUSCULAR | Status: DC | PRN
  Administered 2020-05-04: 10 mg via INTRAVENOUS

## 2020-05-04 MED ORDER — ASPIRIN EC 81 MG PO TBEC: 81.0000 mg | DELAYED_RELEASE_TABLET | Freq: Every day | ORAL | 2 refills | Status: AC

## 2020-05-04 MED ORDER — PROPOFOL 500 MG/50ML IV EMUL
INTRAVENOUS | Status: AC
  Filled 2020-05-04: qty 50

## 2020-05-04 SURGICAL SUPPLY — 62 items
APL PRP STRL LF DISP 70% ISPRP (MISCELLANEOUS) ×1
BAG DECANTER FOR FLEXI CONT (MISCELLANEOUS) ×2 IMPLANT
BLADE SAW SAG 73X25 THK (BLADE)
BLADE SAW SGTL 73X25 THK (BLADE) IMPLANT
CHLORAPREP W/TINT 26 (MISCELLANEOUS) ×2 IMPLANT
COVER SURGICAL LIGHT HANDLE (MISCELLANEOUS) ×2 IMPLANT
COVER WAND RF STERILE (DRAPES) IMPLANT
DECANTER SPIKE VIAL GLASS SM (MISCELLANEOUS) ×4 IMPLANT
DRAPE C-ARM 42X120 X-RAY (DRAPES) IMPLANT
DRAPE C-ARMOR (DRAPES) IMPLANT
DRAPE ORTHO SPLIT 77X108 STRL (DRAPES) ×4
DRAPE SHEET LG 3/4 BI-LAMINATE (DRAPES) ×2 IMPLANT
DRAPE SURG ORHT 6 SPLT 77X108 (DRAPES) ×2 IMPLANT
DRAPE U-SHAPE 47X51 STRL (DRAPES) ×2 IMPLANT
DRSG AQUACEL AG ADV 3.5X10 (GAUZE/BANDAGES/DRESSINGS) ×2 IMPLANT
ELECT BLADE TIP CTD 4 INCH (ELECTRODE) ×2 IMPLANT
ELECT REM PT RETURN 15FT ADLT (MISCELLANEOUS) ×2 IMPLANT
GAUZE SPONGE 4X4 12PLY STRL (GAUZE/BANDAGES/DRESSINGS) IMPLANT
GAUZE XEROFORM 5X9 LF (GAUZE/BANDAGES/DRESSINGS) IMPLANT
GLOVE BIO SURGEON STRL SZ7.5 (GLOVE) ×2 IMPLANT
GLOVE BIO SURGEON STRL SZ8.5 (GLOVE) ×2 IMPLANT
GLOVE BIOGEL PI IND STRL 9 (GLOVE) ×1 IMPLANT
GLOVE BIOGEL PI INDICATOR 9 (GLOVE) ×1
GLOVE SRG 8 PF TXTR STRL LF DI (GLOVE) ×1 IMPLANT
GLOVE SURG UNDER POLY LF SZ8 (GLOVE) ×2
GOWN STRL REIN XL XLG (GOWN DISPOSABLE) ×4 IMPLANT
HEAD CERAMIC BIOLOX 36 +3 (Hips) ×1 IMPLANT
HOLDER FOLEY CATH W/STRAP (MISCELLANEOUS) ×2 IMPLANT
HOOD PEEL AWAY FLYTE STAYCOOL (MISCELLANEOUS) ×8 IMPLANT
IMMOBILIZER KNEE 20 (SOFTGOODS)
IMMOBILIZER KNEE 20 THIGH 36 (SOFTGOODS) IMPLANT
IV NS IRRIG 3000ML ARTHROMATIC (IV SOLUTION) ×2 IMPLANT
KIT BASIN OR (CUSTOM PROCEDURE TRAY) ×2 IMPLANT
KIT TURNOVER KIT A (KITS) IMPLANT
LINER NEUTRAL 52X36X54 PLUS 4 (Liner) ×1 IMPLANT
NDL HYPO 21X1.5 SAFETY (NEEDLE) ×1 IMPLANT
NDL MAYO CATGUT SZ4 TPR NDL (NEEDLE) IMPLANT
NDL SAFETY ECLIPSE 18X1.5 (NEEDLE) ×1 IMPLANT
NEEDLE HYPO 18GX1.5 SHARP (NEEDLE) ×2
NEEDLE HYPO 21X1.5 SAFETY (NEEDLE) ×2 IMPLANT
NEEDLE MAYO CATGUT SZ4 (NEEDLE) IMPLANT
NS IRRIG 1000ML POUR BTL (IV SOLUTION) ×2 IMPLANT
PACK TOTAL JOINT (CUSTOM PROCEDURE TRAY) ×2 IMPLANT
PASSER SUT SWANSON 36MM LOOP (INSTRUMENTS) IMPLANT
PENCIL SMOKE EVACUATOR (MISCELLANEOUS) IMPLANT
PROTECTOR NERVE ULNAR (MISCELLANEOUS) ×2 IMPLANT
SPONGE LAP 18X18 RF (DISPOSABLE) IMPLANT
STAPLER VISISTAT 35W (STAPLE) ×2 IMPLANT
SUT ETHIBOND 2 V 37 (SUTURE) ×6 IMPLANT
SUT VIC AB 0 CT1 36 (SUTURE) ×2 IMPLANT
SUT VIC AB 1 CTX 36 (SUTURE) ×2
SUT VIC AB 1 CTX36XBRD ANBCTR (SUTURE) ×1 IMPLANT
SUT VIC AB 3-0 CT1 27 (SUTURE) ×2
SUT VIC AB 3-0 CT1 TAPERPNT 27 (SUTURE) ×1 IMPLANT
SWAB COLLECTION DEVICE MRSA (MISCELLANEOUS) IMPLANT
SWAB CULTURE ESWAB REG 1ML (MISCELLANEOUS) IMPLANT
SYR CONTROL 10ML LL (SYRINGE) ×4 IMPLANT
TOWEL OR 17X26 10 PK STRL BLUE (TOWEL DISPOSABLE) ×2 IMPLANT
TOWEL OR NON WOVEN STRL DISP B (DISPOSABLE) ×2 IMPLANT
TOWER CARTRIDGE SMART MIX (DISPOSABLE) IMPLANT
TRAY FOLEY MTR SLVR 14FR STAT (SET/KITS/TRAYS/PACK) ×2 IMPLANT
WATER STERILE IRR 1000ML POUR (IV SOLUTION) ×4 IMPLANT

## 2020-05-04 NOTE — Anesthesia Procedure Notes (Signed)
Procedure Name: Intubation Date/Time: 05/04/2020 7:52 AM Performed by: Florene Route, CRNA Patient Re-evaluated:Patient Re-evaluated prior to induction Oxygen Delivery Method: Circle system utilized Preoxygenation: Pre-oxygenation with 100% oxygen Induction Type: IV induction Ventilation: Mask ventilation without difficulty and Oral airway inserted - appropriate to patient size Laryngoscope Size: Glidescope Grade View: Grade I Tube type: Parker flex tip Tube size: 7.5 mm Number of attempts: 2 Airway Equipment and Method: Stylet and Video-laryngoscopy Placement Confirmation: ETT inserted through vocal cords under direct vision,  positive ETCO2 and breath sounds checked- equal and bilateral Secured at: 23 cm Tube secured with: Tape Dental Injury: Teeth and Oropharynx as per pre-operative assessment  Difficulty Due To: Difficulty was anticipated, Difficult Airway- due to large tongue, Difficult Airway- due to reduced neck mobility and Difficult Airway- due to anterior larynx Future Recommendations: Recommend- induction with short-acting agent, and alternative techniques readily available

## 2020-05-04 NOTE — Transfer of Care (Signed)
Immediate Anesthesia Transfer of Care Note  Patient: Raymond Graham  Procedure(s) Performed: RIGHT TOTAL HIP REVISION OF BEARING POSTERIOR APPROACH (Right Hip)  Patient Location: PACU  Anesthesia Type:General  Level of Consciousness: drowsy  Airway & Oxygen Therapy: Patient Spontanous Breathing and Patient connected to face mask oxygen  Post-op Assessment: Report given to RN and Post -op Vital signs reviewed and stable  Post vital signs: Reviewed and stable  Last Vitals:  Vitals Value Taken Time  BP 100/56 05/04/20 0950  Temp    Pulse 62 05/04/20 0951  Resp 20 05/04/20 0951  SpO2 100 % 05/04/20 0951  Vitals shown include unvalidated device data.  Last Pain:  Vitals:   05/04/20 0553  TempSrc: Oral  PainSc:          Complications: No complications documented.

## 2020-05-04 NOTE — Evaluation (Signed)
Physical Therapy Evaluation Patient Details Name: Raymond Graham MRN: [id number] DOB: [birth year] Today's Date: 05/04/2020   History of Present Illness  patient is a 70 yo male s/p R THR posterior approach  on 05/04/2020 with PMH significant for HTN, GERD, arthritis, B TKA, back sugery, and L shoulder surgery.  Clinical Impression  Pt is a 70yo male s/p R THR posterior approach POD 0. Pt reports that he is independent with mobility at baseline. PT reviewed post hip precautions with pt prior to mobility and provided handout. Pt required MIN guard and verbal cues for sit to stand transfers. Pt required MIN guard progressing to supervision for ambulation 80' with verbal cues for adherence to post hip precautions when turning, RW management, and step to gait pattern with no LOB or knee buckling. Pt was able to safely perform stair negotiation with MIN assist for RW management and safety and cues for sequencing, PT provided education on proper guarding position for family members at home. Pt was able to verbalize post hip precautions on 2nd trial following cues from therapist. Pt's wife will be available to assist 24/7. Recommend home with family support. Pt will benefit from skilled PT to increase independence and safety with mobility.      Follow Up Recommendations Home health PT;Follow surgeon's recommendation for DC plan and follow-up therapies    Equipment Recommendations  None recommended by PT    Recommendations for Other Services       Precautions / Restrictions Precautions Precautions: Fall;Posterior Hip Precaution Booklet Issued: Yes (comment) Precaution Comments: PT utilized teach back pt able to verbalize appropriately on 2nd trial following cues. Restrictions Weight Bearing Restrictions: No Other Position/Activity Restrictions: WBAT      Mobility  Bed Mobility Overal bed mobility: Modified Independent             General bed mobility comments: Pt able to transfer to EOB  with use of B UEs to scoot to EOB    Transfers Overall transfer level: Needs assistance Equipment used: Rolling walker (2 wheeled) Transfers: Sit to/from Stand Sit to Stand: Min guard         General transfer comment: Min guard for sit to stand for safety and verbal cues for hand placement with RW  Ambulation/Gait Ambulation/Gait assistance: Min guard;Supervision Gait Distance (Feet): 80 Feet Assistive device: Rolling walker (2 wheeled) Gait Pattern/deviations: Step-to pattern;Decreased stride length;Decreased weight shift to right Gait velocity: decr   General Gait Details: pt ambilated with MIN guard-supervision for safety and verbal cues for RW management. PT provided cues to pick up foot with pivoting/turns to adhere to posterior hip precautions  Stairs Stairs: Yes Stairs assistance: Min assist Stair Management: No rails;With walker;Forwards Number of Stairs: 3 General stair comments: pt safely negotiated stairs with MIN assist for safety and RW management. PT provided education on proper guarding position for family members when assisting at home.  Wheelchair Mobility    Modified Rankin (Stroke Patients Only)       Balance Overall balance assessment: Needs assistance Sitting-balance support: Feet supported Sitting balance-Leahy Scale: Good     Standing balance support: Bilateral upper extremity supported Standing balance-Leahy Scale: Poor Standing balance comment: required use of RW to maintain standing balance                             Pertinent Vitals/Pain Pain Assessment: 0-10 Pain Score: 5  Pain Location: R hip Pain Descriptors / Indicators: Sore;Tender Pain Intervention(s):  Limited activity within patient's tolerance;Monitored during session;Repositioned;Ice applied    Home Living Family/patient expects to be discharged to:: Private residence Living Arrangements: Spouse/significant other Available Help at Discharge: Family Type of  Home: House Home Access: Stairs to enter Entrance Stairs-Rails: None Entrance Stairs-Number of Steps: 3 Home Layout: One level Home Equipment: Environmental consultant - 2 wheels;Cane - single point;Crutches;Bedside commode Additional Comments: pt's wife is availble to assist 24/7 at home    Prior Function Level of Independence: Independent               Hand Dominance   Dominant Hand: Right    Extremity/Trunk Assessment   Upper Extremity Assessment Upper Extremity Assessment: Overall WFL for tasks assessed    Lower Extremity Assessment Lower Extremity Assessment: RLE deficits/detail RLE Deficits / Details: pt with 4/5 quad set strength RLE Sensation: WNL RLE Coordination: WNL    Cervical / Trunk Assessment Cervical / Trunk Assessment: Normal  Communication   Communication: No difficulties  Cognition Arousal/Alertness: Awake/alert Behavior During Therapy: WFL for tasks assessed/performed Overall Cognitive Status: Within Functional Limits for tasks assessed                                        General Comments      Exercises Total Joint Exercises Ankle Circles/Pumps: AROM;Both;20 reps;Seated Quad Sets: AROM;Right;5 reps;Seated Short Arc Quad: AROM;Right;5 reps;Seated Heel Slides: AROM;Right;5 reps;Seated Hip ABduction/ADduction: AROM;Right;5 reps;Seated Long Arc Quad: AROM;Right;5 reps;Seated   Assessment/Plan    PT Assessment Patient needs continued PT services  PT Problem List Decreased strength;Decreased range of motion;Decreased activity tolerance;Decreased balance;Decreased mobility;Decreased knowledge of use of DME;Pain       PT Treatment Interventions DME instruction;Gait training;Stair training;Functional mobility training;Therapeutic activities;Therapeutic exercise;Balance training;Neuromuscular re-education;Patient/family education    PT Goals (Current goals can be found in the Care Plan section)  Acute Rehab PT Goals Patient Stated Goal: pt  likes to do water aerobics and golf PT Goal Formulation: With patient Time For Goal Achievement: 05/11/20 Potential to Achieve Goals: Good    Frequency 7X/week   Barriers to discharge        Co-evaluation               AM-PAC PT "6 Clicks" Mobility  Outcome Measure Help needed turning from your back to your side while in a flat bed without using bedrails?: A Little Help needed moving from lying on your back to sitting on the side of a flat bed without using bedrails?: A Little Help needed moving to and from a bed to a chair (including a wheelchair)?: A Little Help needed standing up from a chair using your arms (e.g., wheelchair or bedside chair)?: A Little Help needed to walk in hospital room?: A Little Help needed climbing 3-5 steps with a railing? : A Little 6 Click Score: 18    End of Session Equipment Utilized During Treatment: Gait belt Activity Tolerance: Patient tolerated treatment well Patient left: in chair;with call bell/phone within reach Nurse Communication: Mobility status PT Visit Diagnosis: Unsteadiness on feet (R26.81);Muscle weakness (generalized) (M62.81);Pain Pain - Right/Left: Right Pain - part of body: Hip    Time: 1227-1306 PT Time Calculation (min) (ACUTE ONLY): 39 min   Charges:              Loyal Gambler, SPT  Acute rehab    Loyal Gambler 05/04/2020, 2:19 PM

## 2020-05-04 NOTE — Anesthesia Postprocedure Evaluation (Signed)
Anesthesia Post Note  Patient: Raymond Graham  Procedure(s) Performed: RIGHT TOTAL HIP REVISION OF BEARING POSTERIOR APPROACH (Right Hip)     Patient location during evaluation: PACU Anesthesia Type: General Level of consciousness: awake Pain management: pain level controlled Vital Signs Assessment: post-procedure vital signs reviewed and stable Respiratory status: spontaneous breathing Cardiovascular status: stable Postop Assessment: no apparent nausea or vomiting Anesthetic complications: no   No complications documented.  Last Vitals:  Vitals:   05/04/20 1030 05/04/20 1045  BP: 97/63 (!) 89/58  Pulse: (!) 57 (!) 49  Resp: 15 12  Temp:    SpO2: 96% 92%    Last Pain:  Vitals:   05/04/20 1045  TempSrc:   PainSc: 0-No pain   Pain Goal:                   Caren Macadam

## 2020-05-04 NOTE — Anesthesia Procedure Notes (Signed)
Spinal  Patient location during procedure: OR Start time: 05/04/2020 7:29 AM End time: 05/04/2020 7:44 AM Staffing Performed: anesthesiologist  Anesthesiologist: Leilani Able, MD Preanesthetic Checklist Completed: patient identified, IV checked, site marked, risks and benefits discussed, surgical consent, monitors and equipment checked, pre-op evaluation and timeout performed Spinal Block Patient position: sitting Prep: DuraPrep and site prepped and draped Patient monitoring: continuous pulse ox and blood pressure Approach: midline Location: L3-4 Injection technique: single-shot Needle Needle type: Pencan and Quincke  Needle gauge: 24 G Needle length: 10 cm Assessment Sensory level: T4 Events: other event Additional Notes Failed spinal after six attempts with pencan and Quincke

## 2020-05-04 NOTE — Op Note (Signed)
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  Surgeon: Feliberto Gottron. Turner Daniels M.D.  Assistant: Tomi Likens. 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 The pain wakes him up at night and recently got to the point where he could no longer go walking on a regular basis. MRI scan showed fluid collection, but no bony destruction. Plain x-rays show no change in the position of the components and the stem appears to be well ingrown. Risks and benefits of revision surgery have been discussed and questions answered.  Procedure: Patient was identified by arm band receive preoperative IV antibiotics in the holding area at, and hospital. He was then taken to the operating room where the appropriate anesthetic monitors were attached and general endotracheal anesthesia induced with the patient in the supine position.  Dannielle Burn, PA-C assisted in positioning the patient holding retractors removal and insertion of implants and wound closure.  He was then rolled into the L lateral decubitus position and fixed there with a mark 2 pelvic clamp. A Foley catheter was inserted and the limb prepped and draped in usual sterile fashion from the ankle to the hemipelvis. Time out procedure performed. Skin along the lateral hip and thigh infiltrated with 10 cc of 1/2% Marcaine and epinephrine solution. We began the operation by recreating the old posterior lateral incision 15 cm in line through the skin and  subcutaneous tissue down to the level of the IT band which was cut in line with the skin incision. This exposed the sac of fluid which was sent off for Gram stain and culture. It came back with no cells. The sac was excised. We then remove scar tissue from around to the ASR cup and femoral stem trunnion, dislocated a total hip and removed the ASR head with a mallet and metal cylinder. The trunnion was then tucked anterior and superior to the acetabulum and we continued to remove scar tissue from around the acetabular component.  The acetabular shell was well fixed.  Using a small wedge tool we broke the seal between the metal liner and a Pinnacle shell and removed it without difficulty.  The wound was then irrigated out with normal saline solution and packed with a TXA sponge to minimize bleeding.  The shell was a 54 mm shell.  We selected a 10 degree polyethylene liner +4 and inserted into the shell and impacted it without difficulty.  We then placed a new +3 x 36 mm ceramic head on the stem and reduced the hip.  Again the wound was irrigated out thoroughly with normal saline solution.  The capsular flap was repaired back to the intertrochanteric crest through drill holes with #2 Ethibond suture. We then closed the IT band with running #1 Vicryl suture, the subcutaneous tissue with 0 and 2-0 undyed Vicryl suture, the skin was closed with running interlocking 3-0 nylon suture. A dressing of Mepilex was then applied, the patient was unclamped a rolled supine awakened extubated and taken  to the recovery without difficulty.

## 2020-05-04 NOTE — Discharge Instructions (Signed)

## 2020-05-04 NOTE — Interval H&P Note (Signed)
History and Physical Interval Note:  05/04/2020 6:55 AM  Raymond Graham  has presented today for surgery, with the diagnosis of RIGHT TOTAL HIP PAIN.  The various methods of treatment have been discussed with the patient and family. After consideration of risks, benefits and other options for treatment, the patient has consented to  Procedure(s): RIGHT TOTAL HIP REVISION OF BEARING POSTERIOR APPROACH (Right) as a surgical intervention.  The patient's history has been reviewed, patient examined, no change in status, stable for surgery.  I have reviewed the patient's chart and labs.  Questions were answered to the patient's satisfaction.     Nestor Lewandowsky

## 2020-05-04 NOTE — Anesthesia Postprocedure Evaluation (Deleted)
Anesthesia Post Note  Patient: Raymond Graham  Procedure(s) Performed: RIGHT TOTAL HIP REVISION OF BEARING POSTERIOR APPROACH (Right Hip)     Anesthesia Post Evaluation No complications documented.  Last Vitals:  Vitals:   05/04/20 0553  BP: (!) 155/92  Pulse: 65  Resp: 18  Temp: 36.8 C  SpO2: 100%    Last Pain:  Vitals:   05/04/20 0553  TempSrc: Oral  PainSc:                  Caleah Tortorelli L.

## 2020-05-05 ENCOUNTER — Encounter (HOSPITAL_COMMUNITY): Payer: Self-pay | Admitting: Orthopedic Surgery

## 2020-05-09 LAB — AEROBIC/ANAEROBIC CULTURE W GRAM STAIN (SURGICAL/DEEP WOUND): Culture: NO GROWTH

## 2020-11-26 ENCOUNTER — Ambulatory Visit
Admission: RE | Admit: 2020-11-26 | Discharge: 2020-11-26 | Disposition: A | Discharge status: Home / Self Care | Payer: Medicare Other | Source: Ambulatory Visit | Attending: Family Medicine | Admitting: Family Medicine

## 2020-11-26 ENCOUNTER — Other Ambulatory Visit: Payer: Self-pay | Admitting: Family Medicine

## 2020-11-26 ENCOUNTER — Other Ambulatory Visit: Payer: Self-pay

## 2020-11-26 DIAGNOSIS — R52 Pain, unspecified: Secondary | ICD-10-CM

## 2021-03-09 ENCOUNTER — Other Ambulatory Visit (HOSPITAL_COMMUNITY): Payer: Self-pay | Admitting: Family Medicine

## 2021-03-09 DIAGNOSIS — I10 Essential (primary) hypertension: Secondary | ICD-10-CM

## 2021-03-12 ENCOUNTER — Encounter (HOSPITAL_COMMUNITY): Payer: Self-pay | Admitting: Family Medicine

## 2021-03-28 ENCOUNTER — Other Ambulatory Visit: Payer: Self-pay | Admitting: Urology

## 2021-04-02 ENCOUNTER — Other Ambulatory Visit: Payer: Self-pay

## 2021-04-02 ENCOUNTER — Ambulatory Visit (HOSPITAL_COMMUNITY): Payer: Medicare Other | Attending: Cardiology

## 2021-04-02 DIAGNOSIS — I10 Essential (primary) hypertension: Secondary | ICD-10-CM | POA: Insufficient documentation

## 2021-04-03 LAB — ECHOCARDIOGRAM COMPLETE
Area-P 1/2: 3.89 cm2
S' Lateral: 3.4 cm

## 2022-02-10 ENCOUNTER — Encounter: Payer: Self-pay | Admitting: Family Medicine

## 2022-02-10 ENCOUNTER — Ambulatory Visit (INDEPENDENT_AMBULATORY_CARE_PROVIDER_SITE_OTHER): Payer: Medicare Other | Admitting: Family Medicine

## 2022-02-10 VITALS — Ht 68.0 in | Wt 236.8 lb

## 2022-02-10 DIAGNOSIS — N1831 Chronic kidney disease, stage 3a: Secondary | ICD-10-CM | POA: Diagnosis not present

## 2022-02-10 NOTE — Patient Instructions (Addendum)
TASTE PREFERENCES ARE LEARNED.  This means it will get easier to choose foods you know are good for you if you are exposed to them enough.    If you must have a sweet drink, try adding club soda or seltzer to dilute the sugar and calorie content.  This helps to change your taste preference for sweet.    Goals:  1 . Eat at least 18 BALANCED meals per week.  Eat breakfast within one hour of getting up.  Aim for no more than 5 hours between eating.  (It's best to limit your "window-of-eating time to <10 hours during the day.) A BALANCED breakfast includes at least some protein and some starch (fruit optional).   A BALANCED lunch or dinner includes at least some protein, some starch, and vegetables and/or fruit.   (OR: Would you serve this to a guest in your home, and call it a meal?)  2. In addition to water exercise 3 times a week, use the elliptical at least 30 minutes 2 times a week.       - The earlier in the day you exercise, the more likely it is that you will follow through with your intention.    3. Consume at least 32 ounces of water per day.       - Get in the routine of:  - Drinking at least 8 (maybe 12 or 16) ounces of water before breakfast.        - Carry a water bottle with you - any time you are in the car.     - Aim to have at least 3 of your 4 cups consumed by lunchtime.    - Document the progress on your goals using the GOALS SHEET provided today.    - Follow-up appt: Monday, Dec 4 at 10 AM.     If any questions, call Dr. Jenne Campus at [phone].

## 2022-02-10 NOTE — Progress Notes (Signed)
Medical Nutrition Therapy Appt start time: 1330 end time: 3762 (1 hour) PCP Keith Rake, MD, [facility]  Primary concerns today: Weight management and diet for stage 3 CKD .   Relevant history/background: Raymond Graham was referred for MNT by Keith Rake, MD related to Stage 3 CKD and obesity.  Other dx's include HLD, HTN, MDD, GERD.    Assessment:  Mr. Cohron lives with his wife, with whom he shares food preparation.  Usually eats takeout/restaurant food ~2 X wk.  He enjoys cooking.    Learning Readiness: Ready  Usual eating pattern: 2-3 meals and 1-2 snacks in evening.  Often skips mid-day meal.   Frequent foods and beverages: water (~16 oz/day), Crystal Light, diet soda (~4 X wk), 2 c coffee, each with ~2 tbsp nondairy creamer, 2 tsp stevia; eggs, grits, bacon, sausage, chx, beef, cheese, bread.   Avoided foods: none.   Usual physical activity: 45 min water aerobics 3 X wk at [facility].  Has an elliptical at home, but seldom uses it.   Sleep: Estimates he gets ~6 hrs/night.    24-hr recall: (Up at 7 AM) B (9 AM)-   3 slc bacon, 1 1/2 scrmbld eggs, 1 slc toast, 1/2 c applesauce, 2 X (coffee, ~2 tbsp nondairy creamer, 2 tsp stevia in the raw) Snk ( AM)-   water L (2 PM)-  1 baloney (1 slc) sandwich, mustard, Crystal Light Snk ( PM)-  --- D (6 PM)-  1/2 c fried, breaded fish & shrimp, 1 c fries, Crystal Light Snk (7:30)-  1 PB (1/4 c) sandwich, water Snk (8 PM)-  1 1/2 c rice pudding w/ raisins Typical day? No. Often does not eat mid-day.    Nutritional Diagnosis:  NB-2.1 Physical inactivity As related to physical activity recommendations.  As evidenced by physical activity limited to 45 min water exercise 3 X wk.  Handouts given during visit include: After-Visit Summary (AVS) Goals Sheet (blank)  Demonstrated degree of understanding via:  Teach Back  Barriers to learning/adherence to lifestyle change: Longstanding diet and exercise routines.    Monitoring/Evaluation:   Dietary intake, exercise, and body weight in 4 week(s).

## 2022-03-14 ENCOUNTER — Ambulatory Visit: Payer: Medicare Other | Admitting: Family Medicine

## 2022-03-17 NOTE — Progress Notes (Signed)
Medical Nutrition Therapy Appt start time: 1000 end time: 1100 (1 hour) PCP Raymond Rake, MD, [facility]  Primary concerns today: Weight management and diet for stage 3 CKD .   Relevant history/background: Raymond Graham was referred for MNT by Raymond Rake, MD related to Stage 3 CKD and obesity.  Other dx's include HLD, HTN, MDD, GERD.    Assessment:  Raymond Graham has been dealing with sciatica in the past month, which was followed by bronchitis, from which he is just recovering.  These health setbacks have meant limited ability to exercise, and poor motivation to follow other recommendations.  He inquired today re. weight loss med's, having heard much re. Ozempic.  I explained their mechanism of action, and also pointed out that for long-term health, sound dietary and physical activity choices would be essential with or without medication use.    Learning Readiness: Ready  Usual eating pattern: 2 meals and 1-2 snacks in evening.   Usual physical activity: Has not yet used home elliptical.   Sleep: Estimates he gets ~6 hrs/night.   Progress on Goals Balanced meals/wk:   Has not met.  Ex in addn to Y 3 X wk:  Has not met.  32 oz water/day:   >50 oz/day  24-hr recall:  (Up at 9 AM; drank 16 oz water) B (10 AM)-  1 sausage link, 2 scrmbld eggs, 1/2 c apple sauce, 2 toast, 2 tbsp butter, 2 c coffee, 4 tsp Stevia in the Raw, 4 tbsp powdered creamer, o.j.  Snk ( AM)-  --- L (2 PM)-  1 1/2 c chips, 1/2 c nacho cheese, Crystal Light Snk ( PM)-  water D (6:30 PM)-  6 oz ham, 1 c lima beans, 2-3 c mac&chs, 2 rolls, 2 tbsp butter, 12 oz ginger ale Snk (8 PM)-  2 cinnamon buns, water Typical day? Yes.     Nutritional Diagnosis: No progress noted on NB-2.1 Physical inactivity As related to physical activity recommendations.  As evidenced by physical activity limited to 45 min water exercise 3 X wk.  Handouts given during visit include: After-Visit Summary (AVS) Goals Sheet   Demonstrated degree  of understanding via:  Teach Back  Barriers to learning/adherence to lifestyle change: Longstanding diet and exercise routines.    Monitoring/Evaluation:  Dietary intake, exercise, and body weight in 6 week(s).

## 2022-03-21 ENCOUNTER — Ambulatory Visit (INDEPENDENT_AMBULATORY_CARE_PROVIDER_SITE_OTHER): Payer: Medicare Other | Admitting: Family Medicine

## 2022-03-21 DIAGNOSIS — N1831 Chronic kidney disease, stage 3a: Secondary | ICD-10-CM | POA: Diagnosis not present

## 2022-03-21 NOTE — Patient Instructions (Addendum)
NOTE:  Our insulin becomes less sensitive as we get closer to bedtime.  This means your blood sugar response is higher late in the day, promoting the likelihood that you'll store fat as you sleep.    Consider:  Your willpower is at its lowest late in the day when you are tired.  It is also going to be harder to avoid eating late in the day if you have not met your nutritional (or psychological) needs earlier in the day.  We are hard-wired to get pleasure from food.  When you skip a meal, you skip the opportunity for that pleasure, i.e., you tend to make up in the evening for what you didn't get earlier in the day.   When we skip a meal, we tend to make it up in snacks, which are not salads, let's just say! Lunch options:   - Soup and sandwich (first microwave frozen veg's, then add soup, and reheat). - Salad with some source of protein (chicken, [place], tuna, beans), crackers or a sandwich.    - Leftovers from last night's dinner.  (If no veg left over, then microwave some.)  Remember: TASTE PREFERENCES ARE LEARNED.  This means it will get easier to choose foods you know are good for you if you are exposed to them enough.    If you must have a sweet drink, try adding club soda or seltzer to dilute the sugar and calorie content.  This helps to change your taste preference for sweet.    Goals:  1 . Eat at least 10 BALANCED lunches/dinners per week.  Eat breakfast within one hour of getting up.  Aim for no more than 5 hours between eating.  (It's best to limit your "window-of-eating time to <10 hours during the day.) A BALANCED lunch or dinner includes at least some protein, some starch, and vegetables and/or fruit.   2. In addition to water exercise 3 times a week, use the elliptical at least 30 minutes 2 times a week.       - The earlier in the day you exercise, the more likely it is that you will follow through with your intention.   3. Consume at least 32 ounces of water per day.       - Get in  the routine of:  - Drinking at least 8 (maybe 12 or 16) ounces of water before breakfast.               - Carry a water bottle with you - any time you are in the car.               - Aim to have at least 3 of your 4 cups consumed by lunchtime.     - Document the progress on your goals using the GOALS SHEET provided today.     - Follow-up appt on Tuesday, January 23 at 11 AM.

## 2022-04-05 ENCOUNTER — Ambulatory Visit (INDEPENDENT_AMBULATORY_CARE_PROVIDER_SITE_OTHER): Payer: Medicare Other

## 2022-04-05 ENCOUNTER — Encounter: Payer: Self-pay | Admitting: Emergency Medicine

## 2022-04-05 ENCOUNTER — Ambulatory Visit
Admission: EM | Admit: 2022-04-05 | Discharge: 2022-04-05 | Disposition: A | Discharge status: Home / Self Care | Payer: Medicare Other | Attending: Family Medicine | Admitting: Family Medicine

## 2022-04-05 DIAGNOSIS — R053 Chronic cough: Secondary | ICD-10-CM | POA: Diagnosis not present

## 2022-04-05 DIAGNOSIS — R059 Cough, unspecified: Secondary | ICD-10-CM | POA: Diagnosis not present

## 2022-04-05 DIAGNOSIS — I10 Essential (primary) hypertension: Secondary | ICD-10-CM

## 2022-04-05 DIAGNOSIS — R062 Wheezing: Secondary | ICD-10-CM | POA: Diagnosis not present

## 2022-04-05 MED ORDER — PREDNISONE 20 MG PO TABS: 40.0000 mg | ORAL_TABLET | Freq: Every day | ORAL | 0 refills | Status: DC

## 2022-04-05 MED ORDER — AZITHROMYCIN 250 MG PO TABS: 250.0000 mg | ORAL_TABLET | Freq: Every day | ORAL | 0 refills | Status: DC

## 2022-04-05 MED ORDER — BENZONATATE 100 MG PO CAPS: ORAL_CAPSULE | ORAL | 0 refills | Status: DC

## 2022-04-05 NOTE — ED Provider Notes (Signed)
[facility]ARE CENTER   [id number] 04/05/22 Arrival Time: 0844  ASSESSMENT & PLAN:  1. Persistent cough   2. Wheezing   3. Elevated blood pressure reading in office with diagnosis of hypertension    I have personally viewed the imaging studies ordered this visit. No acute changes. No PNA.  Given duration of symptoms without sign of improving and with wheezing will treat as below. Meds ordered this encounter  Medications   azithromycin (ZITHROMAX) 250 MG tablet    Sig: Take 1 tablet (250 mg total) by mouth daily. Take first 2 tablets together, then 1 every day until finished.    Dispense:  6 tablet    Refill:  0   benzonatate (TESSALON) 100 MG capsule    Sig: Take 1 capsule by mouth every 8 (eight) hours for cough.    Dispense:  21 capsule    Refill:  0   predniSONE (DELTASONE) 20 MG tablet    Sig: Take 2 tablets (40 mg total) by mouth daily.    Dispense:  10 tablet    Refill:  0    Discharge Instructions      Your blood pressure was noted to be elevated during your visit today. If you are currently taking medication for high blood pressure, please ensure you are taking this as directed. If you do not have a history of high blood pressure and your blood pressure remains persistently elevated, you may need to begin taking a medication at some point. You may return here within the next few days to recheck if unable to see your primary care provider or if you do not have a one.  BP (!) 193/105   Pulse (!) 58   Temp 98 F (36.7 C)   Resp 18   SpO2 95%   BP Readings from Last 3 Encounters:  04/05/22 (!) 193/105  05/04/20 113/76  04/29/20 (!) 154/94       Follow-up Information     Ellyn Hack, MD.   Specialty: Family Medicine Why: If worsening or failing to improve as anticipated. And to follow up on your blood pressure. Contact information: [address] [place] [address] [phone]                 Reviewed expectations re: course of  current medical issues. Questions answered. Outlined signs and symptoms indicating need for more acute intervention. Understanding verbalized. After Visit Summary given.   SUBJECTIVE: History from: Patient. Raymond Graham is a 71 y.o. male. Reports: cough and chest congestion; at least past two weeks, ques longer. No resp distress. Is wheezing at times. "Just not getting better."   Social History   Tobacco Use  Smoking Status Former   Types: Cigarettes   Quit date: 04/12/1991   Years since quitting: 31.0  Smokeless Tobacco Never  Tobacco Comments   QUIT SMOKING ABOUT 15 OR 20 YRS AGO-ONLY SMOKED ON WEEKENDS   Denies CP. Normal PO intake without n/v/d.  OBJECTIVE:  Vitals:   04/05/22 1026  BP: (!) 193/105  Pulse: (!) 58  Resp: 18  Temp: 98 F (36.7 C)  SpO2: 95%    General appearance: alert; no distress Eyes: PERRLA; EOMI; conjunctiva normal HENT: [place]; AT; with nasal congestion Neck: supple  Lungs: speaks full sentences without difficulty; unlabored; coarse breath sounds bilat; with exp wheezing bilat Extremities: no edema Skin: warm and dry Neurologic: normal gait Psychological: alert and cooperative; normal mood and affect   Imaging: DG Chest 2 View  Result Date: 04/05/2022 CLINICAL DATA:  Cough. EXAM: CHEST - 2 VIEW COMPARISON:  April 29, 2020. FINDINGS: The heart size and mediastinal contours are within normal limits. Both lungs are clear. Status post left shoulder arthroplasty. IMPRESSION: No active cardiopulmonary disease. Electronically Signed   By: Lupita Raider M.D.   On: 04/05/2022 11:10    No Known Allergies  Past Medical History:  Diagnosis Date   Arthritis    PAIN AND ARTHRITIS LEFT HIP AND LEFT KNEE osteoarthritis   BPH (benign prostatic hypertrophy)    PT ON FLOMAX-DR. WRENN IS PT'S UROLOGIST   GERD (gastroesophageal reflux disease)    OCAS-NO MEDS   Hypertension    Seasonal allergies    Social History   Socioeconomic History    Marital status: Married    Spouse name: Not on file   Number of children: Not on file   Years of education: Not on file   Highest education level: Not on file  Occupational History   Not on file  Tobacco Use   Smoking status: Former    Types: Cigarettes    Quit date: 04/12/1991    Years since quitting: 31.0   Smokeless tobacco: Never   Tobacco comments:    QUIT SMOKING ABOUT 15 OR 20 YRS AGO-ONLY SMOKED ON WEEKENDS  Vaping Use   Vaping Use: Never used  Substance and Sexual Activity   Alcohol use: Yes    Alcohol/week: 2.0 standard drinks of alcohol    Types: 2 Shots of liquor per week    Comment: occasionally   Drug use: No   Sexual activity: Not on file  Other Topics Concern   Not on file  Social History Narrative   Not on file   Social Determinants of Health   Financial Resource Strain: Not on file  Food Insecurity: Not on file  Transportation Needs: Not on file  Physical Activity: Not on file  Stress: Not on file  Social Connections: Not on file  Intimate Partner Violence: Not on file   History reviewed. No pertinent family history. Past Surgical History:  Procedure Laterality Date   BACK SURGERY  10/2008   LUMBAR SURGERY FOR STENOSIS   COLONOSCOPY     JOINT REPLACEMENT  2005   RIGHT HIP REPLACEMENT    LAPAROTOMY N/A 07/23/2016   Procedure: EXPLORATORY LAPAROTOMY WITH [facility] REPAIR OF PYLORIC ULCER/HERNIA;  Surgeon: Almond Lint, MD;  Location: [facility] OR;  Service: General;  Laterality: N/A;   LEFT BUNIONECTOMY     LEFT GANGLION CYST REMOVED     TOTAL HIP ARTHROPLASTY  05/31/2011   Procedure: TOTAL HIP ARTHROPLASTY ANTERIOR APPROACH;  (BILATERAL)Surgeon: Shelda Pal, MD;  Location: [facility] ORS;  Service: Orthopedics;  Laterality: Left;   TOTAL HIP REVISION Right 05/04/2020   Procedure: RIGHT TOTAL HIP REVISION OF BEARING POSTERIOR APPROACH;  Surgeon: Gean Birchwood, MD;  Location: [facility] ORS;  Service: Orthopedics;  Laterality: Right;   TOTAL KNEE ARTHROPLASTY  11/21/2011    Procedure: TOTAL KNEE ARTHROPLASTY;  Surgeon: Shelda Pal, MD;  Location: [facility] ORS;  Service: Orthopedics;  Laterality: Left;   TOTAL KNEE ARTHROPLASTY Right 02/07/2017   Procedure: TOTAL KNEE ARTHROPLASTY;  Surgeon: Marcene Corning, MD;  Location: [facility] OR;  Service: Orthopedics;  Laterality: Right;   TOTAL SHOULDER ARTHROPLASTY Left 05/18/2017   TOTAL SHOULDER ARTHROPLASTY Left 05/18/2017   Procedure: LEFT TOTAL SHOULDER ARTHROPLASTY;  Surgeon: Jones Broom, MD;  Location: [facility] OR;  Service: Orthopedics;  Laterality: Left;     Kumiko Fishman,  Arlys John, MD 04/05/22 1126

## 2022-04-05 NOTE — ED Triage Notes (Signed)
Pt is present today with c/o cough, congestion, and HA  Onset~x2 weeks

## 2022-04-05 NOTE — Discharge Instructions (Signed)
Your blood pressure was noted to be elevated during your visit today. If you are currently taking medication for high blood pressure, please ensure you are taking this as directed. If you do not have a history of high blood pressure and your blood pressure remains persistently elevated, you may need to begin taking a medication at some point. You may return here within the next few days to recheck if unable to see your primary care provider or if you do not have a one.  BP (!) 193/105   Pulse (!) 58   Temp 98 F (36.7 C)   Resp 18   SpO2 95%   BP Readings from Last 3 Encounters:  04/05/22 (!) 193/105  05/04/20 113/76  04/29/20 (!) 154/94

## 2022-05-03 ENCOUNTER — Ambulatory Visit (INDEPENDENT_AMBULATORY_CARE_PROVIDER_SITE_OTHER): Payer: Medicare Other | Admitting: Family Medicine

## 2022-05-03 ENCOUNTER — Encounter: Payer: Self-pay | Admitting: Family Medicine

## 2022-05-03 VITALS — Ht 68.0 in | Wt 235.6 lb

## 2022-05-03 DIAGNOSIS — N1831 Chronic kidney disease, stage 3a: Secondary | ICD-10-CM

## 2022-05-03 NOTE — Patient Instructions (Signed)
NOTE:  Our insulin becomes less sensitive as we get closer to bedtime.  This means your blood sugar response is higher late in the day, promoting the likelihood that you'll store fat as you sleep.     Consider:  Your willpower is at its lowest late in the day when you are tired.  It is also going to be harder to avoid eating late in the day if you have not met your nutritional (or psychological) needs earlier in the day.  We are hard-wired to get pleasure from food.  When you skip a meal, you skip the opportunity for that pleasure, i.e., you tend to make up in the evening for what you didn't get earlier in the day.   When we skip a meal, we tend to make it up in snacks (which are not usually salads!).   Lunch options:   - Soup and sandwich (first microwave frozen veg's, then add soup, and reheat). - Salad with some source of protein (chicken, [place], tuna, beans), crackers or a sandwich.               - Leftovers from last night's dinner.  (If no veg left over, then microwave some.)   Remember: TASTE PREFERENCES ARE LEARNED.  This means it will get easier to choose foods you know are good for you if you are exposed to them enough.     If you must have a sweet drink, try adding club soda or seltzer to dilute the sugar and calorie content.  This helps to change your taste preference for sweet.    Carbohydrate includes starch, sugar, and fiber.  Of these, only sugar and starch raise blood glucose.  Fiber, which is good for [place], is found in fruits, vegetables [especially skin, seeds, and stalks], whole grains, and beans.)   Starchy (carb) foods: Bread, rice, pasta, potatoes, corn, cereal, grits, crackers, bagels, muffins, all baked goods.  Sugar is usually obvious b/c it tastes sweet (candy, dessert foods).  It's best to include carb-based foods earlier in the day, and to have moderate portions of these foods to best manage blood sugar levels.   Protein foods: Meat, fish, poultry, eggs, dairy foods, and  beans such as pinto and kidney beans (beans also provide carbohydrate).   Goals:  1 . Eat at least 12 BALANCED meals per week.  Eat breakfast within one hour of getting up.  Aim for no more than 5 hours between eating.  (It's best to limit your "window-of-eating time to <10 hours during the day.) A BALANCED lunch or dinner includes at least some protein, some starch, and vegetables and/or fruit.    2. In addition to water exercise 3 times a week, use the elliptical at least 30 minutes 2 times a week.       - The earlier in the day you exercise, the more likely it is that you will follow through with your intention.    3. Consume at least 56 ounces of water per day.       - Measure the volume of your water bottle to help you track ounces consumed.       - On days you use the elliptical at home, establish a habit of drinking one whole water bottle.    - Document the progress on your goals using the GOALS SHEET provided today.  Also document any evening snack you have on the Goals Sheet and what time you have it.      -  Snack options for evening:  Fresh fruit, Fruit smoothie, apple sauce, cheese & crackers or cheese toast, nuts or trail mix (nuts with dried fruit). 1/2 sandwich.    - Follow-up appt on Tuesday, Feb 20 at 11 AM.  Feel free to bring your wife to your next appt.

## 2022-05-03 NOTE — Progress Notes (Signed)
Medical Nutrition Therapy Appt start time: 1000 end time: 1100 (1 hour) PCP Keith Rake, MD, [facility]  Primary concerns today: Weight management and diet for stage 3 CKD .   Relevant history/background: Mr. Markson was referred for MNT by Keith Rake, MD related to Stage 3 CKD and obesity.  Other dx's include HLD, HTN, MDD, GERD.    Assessment:  Mr. Netzel had sciatica pain in November, followed by bronchitis, and was seen in the ER on 12/26 for persistent cough and difficulty breathing.  Consequently, he has not been able to exercise as desired, and dietary efforts have been inconsistent.  Still concerned re. evening snacking, but as discussed today, Mr. Schueller has not yet experienced eating 3 meals/day with consistency, to know if this will help diminish appetite in the evening.  Often going up to 8 hours without eating during the day.    Learning Readiness: Ready  Usual eating pattern: 2 meals and 1-2 snacks in evening.   Usual physical activity: Has been using elliptical 1-2 X wk and getting water ex 1-3 X wk.   Sleep: Estimates he gets 6 hrs/night, although occasionally has poor sleep night, e.g., 2 hrs.   Progress on Goals Balanced meals/wk:   9-16 meals/wk  Ex in addn to Y 3 X wk:  Only 0-2 X wk  48 oz water/day:   Daily   24-hr recall:  (Up at 9 AM; drank water) B (10 AM)-  3 slc bacon, 2 eggs, 1 toast, 1 tsp butter, 8 oz o.j,, 1/2 c apple sauce, 12 oz coffee, 1 1/2 tsp stevia in the raw Snk ( AM)-  water L ( PM)-  --- Snk ( PM)-  diet soda, Crystal Light D (6 PM)-  6 oz steak, 1/2 c gravy, 1 c rice, 1 c collards, Crystal Light Snk ( PM)-  1 single-serve supreme pizza Typical day? Yes.     Nutritional Diagnosis: No progress noted on NB-2.1 Physical inactivity As related to physical activity recommendations.  As evidenced by physical activity limited to <45 min water exercise 3 X wk.  Handouts given during visit include: After-Visit Summary (AVS) Goals Sheet  (revised)  Demonstrated degree of understanding via:  Teach Back  Barriers to learning/adherence to lifestyle change: Longstanding diet and exercise routines.    Monitoring/Evaluation:  Dietary intake, exercise, and body weight in 4 week(s).

## 2022-05-30 NOTE — Progress Notes (Unsigned)
Medical Nutrition Therapy Appt start time: 1100 end time: 1200 (1 hour) PCP Keith Rake, MD, [facility]  Primary concerns today: Weight management and diet for stage 3 CKD .   Relevant history/background: Mr. Dambra was referred for MNT by Keith Rake, MD related to Stage 3 CKD and obesity.  Other dx's include HLD, HTN, MDD, GERD.    Assessment:  Mr. Burgy expressed frustration that after meeting his goals for a week, he had not lost weight.  This disappointment contributed to less motivation to continue meeting goals, or to consider household work such as washing the car to be "enough" exercise.  He also has realized that although he is accustomed to managing his own problems without seeking help or talking about worries, some family dynamics do influence his ability to follow through on intentions.    Weight: 234.4 lb; (Wt on 05/03/22 was 235.9 lb; Ht is 68".) Usual eating pattern: 2-3 meals and 1-2 snacks in evening.   Usual physical activity: Has been using elliptical 0-2 X wk and getting water ex 1-3 X wk.   Sleep: Estimates he gets 6 hrs/night. Progress on Goals Balanced meals/wk:   9-16 meals/wk  Ex in addn to Y 3 X wk:  0-1 X wk  56 oz water/day:   Daily   24-hr recall:  (Up at 8:30 AM) B (9 AM)-  1 c cooked grits, 2 eggs, 2 sausage patties, 2 toast, 2 tsp butter, 1 1/2 c cantaloupe, 4 oz orange juice, 1 c coffee, 1 tbsp powdered creamer, 1 1/2 tsp Stevia in the Raw Snk ( AM)-  --- L (1 PM)-  Crystal Light Snk ( PM)-  --- D (6 PM)-  1 c home-fried potatoes, 1 c pintos, 1/2 c broccoli, Crystal Light Snk (9 PM)-  2 apples Typical day? Yes.     Nutritional Diagnosis: Little progress noted on NB-2.1 Physical inactivity As related to physical activity recommendations.  As evidenced by physical activity limited to <45 min water exercise 1-3 X wk and additional exercise 0-2 X wk.  Handouts given during visit include: After-Visit Summary (AVS) Goals Sheet   Barriers to  learning/adherence to lifestyle change: Longstanding diet and exercise routines.    Monitoring/Evaluation:  Dietary intake, exercise, and body weight in 6 week(s).

## 2022-05-31 ENCOUNTER — Ambulatory Visit (INDEPENDENT_AMBULATORY_CARE_PROVIDER_SITE_OTHER): Payer: Medicare Other | Admitting: Family Medicine

## 2022-05-31 DIAGNOSIS — N1831 Chronic kidney disease, stage 3a: Secondary | ICD-10-CM

## 2022-05-31 NOTE — Patient Instructions (Addendum)
Consider how to prioritize your needs so you make sure you have the time (and emotional energy) to follow through with your intended behaviors.   Remember: You want to establish these healthy choices as HABITS, so it becomes easier to follow through with your intentions.  Consistency builds habits.    Lunch options:   - Soup and sandwich (first microwave frozen veg's, then add soup, and reheat). - Salad with some source of protein (chicken, [place], tuna, beans), crackers or a sandwich.   - Leftovers from last night's dinner.  (If no veg left over, then microwave some.)    Carbohydrate includes starch, sugar, and fiber.  Of these, only sugar and starch raise blood glucose.  Fiber, which is good for [place], is found in fruits, vegetables [especially skin, seeds, and stalks], whole grains, and beans.)   Starchy (carb) foods: Bread, rice, pasta, potatoes, corn, cereal, grits, crackers, bagels, muffins, all baked goods.  Sugar is usually obvious b/c it tastes sweet (candy, dessert foods).  It's best to include carb-based foods earlier in the day, and to have moderate portions of these foods to best manage blood sugar levels.   Protein foods: Meat, fish, poultry, eggs, dairy foods, and beans such as pinto and kidney beans (beans also provide carbohydrate).    Goals remain the same:  1 . Eat at least 12 BALANCED meals per week.  Eat breakfast within one hour of getting up.  Aim for no more than 5 hours between eating.  (It's best to limit your "window-of-eating time to <10 hours during the day.) A BALANCED lunch or dinner includes at least some protein, some starch, and vegetables and/or fruit.   2. In addition to water exercise 3 times a week, use the elliptical at least 30 minutes 2 times a week.       - The earlier in the day you exercise, the more likely it is that you will follow through with your intention.   3. Consume at least 56 ounces of water per day.       - Measure the volume of your water  bottle to help you track ounces consumed.       - On days you use the elliptical at home, establish a habit of drinking one whole water bottle.     - Document progress on your goals using the GOALS SHEET provided today.  Also document any evening snack you have on the Goals Sheet and what time you have it.      - Snack options for evening:  Fresh fruit, Fruit smoothie, apple sauce, cheese & crackers or cheese toast, nuts or trail mix (nuts with dried fruit). 1/2 sandwich.    - Follow-up appt on Tuesday, April 2 at 11 AM.

## 2022-06-03 ENCOUNTER — Ambulatory Visit
Admission: EM | Admit: 2022-06-03 | Discharge: 2022-06-03 | Disposition: A | Discharge status: Home / Self Care | Payer: Medicare Other | Attending: Internal Medicine | Admitting: Internal Medicine

## 2022-06-03 DIAGNOSIS — J029 Acute pharyngitis, unspecified: Secondary | ICD-10-CM | POA: Diagnosis present

## 2022-06-03 DIAGNOSIS — J069 Acute upper respiratory infection, unspecified: Secondary | ICD-10-CM | POA: Insufficient documentation

## 2022-06-03 LAB — POCT RAPID STREP A (OFFICE): Rapid Strep A Screen: NEGATIVE

## 2022-06-03 MED ORDER — BENZONATATE 100 MG PO CAPS: 100.0000 mg | ORAL_CAPSULE | Freq: Three times a day (TID) | ORAL | 0 refills | Status: DC | PRN

## 2022-06-03 MED ORDER — FLUTICASONE PROPIONATE 50 MCG/ACT NA SUSP: 1.0000 | Freq: Every day | NASAL | 0 refills | Status: AC

## 2022-06-03 NOTE — ED Provider Notes (Signed)
[facility]    CSN: [id number] Arrival date & time: 06/03/22  1426      History   Chief Complaint Chief Complaint  Patient presents with   Cough   Sore Throat    HPI Raymond Graham is a 72 y.o. male.   Patient presents with cough, headache, sore throat, nasal congestion, runny nose, fatigue, decreased appetite that has been present for about 5 days.  Wife has similar symptoms.  Patient has taken Mucinex and Coricidin HBP with no improvement in symptoms.  Patient took a COVID test this morning that was negative.  He denies any known fevers at home.  Denies chest pain, shortness of breath, nausea, vomiting, diarrhea, abdominal pain.  Denies history of asthma or COPD and patient does not smoke cigarettes.   Cough Sore Throat    Past Medical History:  Diagnosis Date   Arthritis    PAIN AND ARTHRITIS LEFT HIP AND LEFT KNEE osteoarthritis   BPH (benign prostatic hypertrophy)    PT ON FLOMAX-DR. 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    Past Surgical History:  Procedure Laterality Date   BACK SURGERY  10/2008   LUMBAR SURGERY FOR STENOSIS   COLONOSCOPY     JOINT REPLACEMENT  2005   RIGHT HIP REPLACEMENT    LAPAROTOMY N/A 07/23/2016   Procedure: EXPLORATORY LAPAROTOMY WITH GRAHAM PATCH REPAIR OF PYLORIC ULCER/HERNIA;  Surgeon: Stark Klein, MD;  Location: [facility];  Service: General;  Laterality: N/A;   LEFT BUNIONECTOMY     LEFT GANGLION CYST REMOVED     TOTAL HIP ARTHROPLASTY  05/31/2011   Procedure: TOTAL HIP  ARTHROPLASTY ANTERIOR APPROACH;  (BILATERAL)Surgeon: Mauri Pole, MD;  Location: [facility];  Service: Orthopedics;  Laterality: Left;   TOTAL HIP REVISION Right 05/04/2020   Procedure: RIGHT TOTAL HIP REVISION OF BEARING POSTERIOR APPROACH;  Surgeon: Frederik Pear, MD;  Location: [facility];  Service: Orthopedics;  Laterality: Right;   TOTAL KNEE ARTHROPLASTY  11/21/2011   Procedure: TOTAL KNEE ARTHROPLASTY;  Surgeon: Mauri Pole, MD;  Location: [facility];  Service: Orthopedics;  Laterality: Left;   TOTAL KNEE ARTHROPLASTY Right 02/07/2017   Procedure: TOTAL KNEE ARTHROPLASTY;  Surgeon: Melrose Nakayama, MD;  Location: [facility];  Service: Orthopedics;  Laterality: Right;   TOTAL SHOULDER ARTHROPLASTY Left 05/18/2017   TOTAL SHOULDER ARTHROPLASTY Left 05/18/2017   Procedure: LEFT TOTAL SHOULDER ARTHROPLASTY;  Surgeon: Tania Ade, MD;  Location: [facility];  Service: Orthopedics;  Laterality: Left;       Home Medications    Prior to Admission medications   Medication Sig Start Date End Date Taking? Authorizing Provider  amLODipine (NORVASC) 10 MG tablet Take 5 mg by mouth daily.   Yes [provider]  benzonatate (TESSALON) 100 MG capsule Take 1 capsule (100 mg total) by mouth every 8 (eight) hours as needed for cough. 06/03/22  Yes Zidan Helget, Hildred Alamin E, FNP  dapagliflozin propanediol (FARXIGA) 10 MG TABS tablet Take by mouth daily.   Yes [provider]  fluticasone (FLONASE) 50 MCG/ACT nasal spray Place 1 spray into both nostrils daily. 06/03/22  Yes Fenna Semel, Hildred Alamin E, FNP  metoprolol (TOPROL-XL) 200 MG 24 hr tablet Take 200 mg by mouth daily. 11/30/16  Yes [provider]  Multiple Vitamin (MULTIVITAMIN WITH MINERALS) TABS Take 1 tablet by mouth daily.   Yes [provider]  olmesartan (BENICAR) 20 MG tablet Take 40 mg by mouth daily.   Yes [provider]  omeprazole (PRILOSEC) 20 MG capsule Take 20 mg by mouth daily as needed (acid reflux). 12/30/16  Yes [provider]  PARoxetine (PAXIL) 20 MG tablet Take 20 mg by mouth daily.   Yes [provider]  rosuvastatin (CRESTOR) 10 MG tablet Take 10 mg by mouth daily.   Yes [provider]  tamsulosin (FLOMAX) 0.4 MG CAPS capsule TAKE 1 CAPSULE BY MOUTH  DAILY Patient taking differently: Take 0.4 mg by mouth daily. 12/10/19  Yes Irine Seal, MD    Family History History reviewed. No pertinent family history.  Social History Social History   Tobacco Use   Smoking status: Former    Types: Cigarettes    Quit date: 04/12/1991    Years since quitting: 31.1   Smokeless tobacco: Never   Tobacco comments:    QUIT SMOKING ABOUT 15 OR 20 YRS AGO-ONLY SMOKED ON WEEKENDS  Vaping Use   Vaping Use: Never used  Substance Use Topics   Alcohol use: Yes    Alcohol/week: 2.0 standard drinks of alcohol    Types: 2 Shots of liquor per week    Comment: occasionally   Drug use: No     Allergies   Patient has no known allergies.   Review of Systems Review of Systems Per HPI  Physical Exam Triage Vital Signs ED Triage Vitals [06/03/22 1517]  Enc Vitals Group     BP (!) 150/87     Pulse Rate 62     Resp 16     Temp 98 F (36.7 C)     Temp Source Oral     SpO2 98 %     Weight      Height      Head Circumference      Peak Flow      Pain Score      Pain Loc      Pain Edu?      Excl. in [place]?    No data found.  Updated Vital Signs BP (!) 150/87 (BP Location: Right Arm)   Pulse 62   Temp 98 F (36.7 C) (Oral)   Resp 16   SpO2 98%   Visual Acuity Right Eye Distance:   Left Eye Distance:   Bilateral Distance:    Right Eye Near:   Left Eye Near:    Bilateral Near:     Physical Exam Constitutional:      General: He is not in acute distress.    Appearance: Normal appearance. He is not toxic-appearing or diaphoretic.  HENT:     Head: Normocephalic and atraumatic.     Right Ear: Tympanic membrane and ear canal normal.     Left Ear: Tympanic membrane and ear  canal normal.     Nose: Congestion present.     Mouth/Throat:     Mouth: Mucous membranes are moist.     Pharynx: Posterior oropharyngeal erythema present.  Eyes:     Extraocular Movements: Extraocular movements intact.     Conjunctiva/sclera: Conjunctivae normal.     Pupils:  Pupils are equal, round, and reactive to light.  Cardiovascular:     Rate and Rhythm: Normal rate and regular rhythm.     Pulses: Normal pulses.     Heart sounds: Normal heart sounds.  Pulmonary:     Effort: Pulmonary effort is normal. No respiratory distress.     Breath sounds: Normal breath sounds. No stridor. No wheezing, rhonchi or rales.  Abdominal:     General: Abdomen is flat. Bowel sounds are normal.     Palpations: Abdomen is soft.  Musculoskeletal:        General: Normal range of motion.     Cervical back: Normal range of motion.  Skin:    General: Skin is warm and dry.  Neurological:     General: No focal deficit present.     Mental Status: He is alert and oriented to person, place, and time. Mental status is at baseline.  Psychiatric:        Mood and Affect: Mood normal.        Behavior: Behavior normal.      UC Treatments / Results  Labs (all labs ordered are listed, but only abnormal results are displayed) Labs Reviewed  CULTURE, GROUP A STREP [facility])  POCT RAPID STREP A (OFFICE)    EKG   Radiology No results found.  Procedures Procedures (including critical care time)  Medications Ordered in UC Medications - No data to display  Initial Impression / Assessment and Plan / UC Course  I have reviewed the triage vital signs and the nursing notes.  Pertinent labs & imaging results that were available during my care of the patient were reviewed by me and considered in my medical decision making (see chart for details).     Patient presents with symptoms likely from a viral upper respiratory infection.  Do not suspect underlying cardiopulmonary process. Symptoms seem unlikely  related to ACS, CHF or COPD exacerbations, pneumonia, pneumothorax. Patient is nontoxic appearing and not in need of emergent medical intervention. Rapid strep was negative. Throat culture pending. No adventitious lung sounds on exam so do not think chest imaging is necessary.   Recommended symptom control with medications and supportive care.  Return if symptoms fail to improve in 1-2 weeks or you develop shortness of breath, chest pain, severe headache. Patient states understanding and is agreeable.  Discharged with PCP followup.  Final Clinical Impressions(s) / UC Diagnoses   Final diagnoses:  Viral upper respiratory tract infection with cough  Sore throat     Discharge Instructions      Strep is negative.  Throat culture is pending.  Will call this abnormal.  It appears that you have a viral illness that should run its course and self resolve with the help of symptomatic treatment as we discussed.  I have prescribed you 2 medications to help alleviate symptoms.  Please follow-up if any symptoms persist or worsen.     ED Prescriptions     Medication Sig Dispense Auth. Provider   benzonatate (TESSALON) 100 MG capsule Take 1 capsule (100 mg total) by mouth every 8 (eight) hours as needed for cough. 21 capsule [place], [place], [place]   fluticasone [facility]) 50 MCG/ACT nasal spray Place 1 spray into both nostrils daily. 16 g Teodora Medici, [employer]      PDMP not reviewed this encounter.   Teodora Medici, [place] 06/03/22 [id number]

## 2022-06-03 NOTE — Discharge Instructions (Signed)
Strep is negative.  Throat culture is pending.  Will call this abnormal.  It appears that you have a viral illness that should run its course and self resolve with the help of symptomatic treatment as we discussed.  I have prescribed you 2 medications to help alleviate symptoms.  Please follow-up if any symptoms persist or worsen.

## 2022-06-03 NOTE — ED Triage Notes (Signed)
Pt is here for cough, headache, side pain , sore throat, nasal congestion, runny nose low energy, loss of appetite x few days.

## 2022-06-05 LAB — CULTURE, GROUP A STREP (THRC)

## 2022-07-12 ENCOUNTER — Ambulatory Visit: Payer: Medicare Other | Admitting: Family Medicine

## 2022-10-18 ENCOUNTER — Other Ambulatory Visit: Payer: Self-pay | Admitting: Family Medicine

## 2022-10-18 ENCOUNTER — Ambulatory Visit
Admission: RE | Admit: 2022-10-18 | Discharge: 2022-10-18 | Disposition: A | Discharge status: Home / Self Care | Payer: Medicare Other | Source: Ambulatory Visit | Attending: Family Medicine | Admitting: Family Medicine

## 2022-10-18 DIAGNOSIS — M79641 Pain in right hand: Secondary | ICD-10-CM

## 2023-06-08 ENCOUNTER — Ambulatory Visit
Admission: EM | Admit: 2023-06-08 | Discharge: 2023-06-08 | Disposition: A | Discharge status: Home / Self Care | Payer: Medicare Other | Attending: Family Medicine | Admitting: Family Medicine

## 2023-06-08 ENCOUNTER — Other Ambulatory Visit: Payer: Self-pay

## 2023-06-08 ENCOUNTER — Ambulatory Visit (INDEPENDENT_AMBULATORY_CARE_PROVIDER_SITE_OTHER): Payer: Medicare Other | Admitting: Radiology

## 2023-06-08 DIAGNOSIS — G8929 Other chronic pain: Secondary | ICD-10-CM

## 2023-06-08 DIAGNOSIS — M25511 Pain in right shoulder: Secondary | ICD-10-CM

## 2023-06-08 DIAGNOSIS — M19019 Primary osteoarthritis, unspecified shoulder: Secondary | ICD-10-CM | POA: Diagnosis not present

## 2023-06-08 MED ORDER — TRIAMCINOLONE ACETONIDE 40 MG/ML IJ SUSP
40.0000 mg | Freq: Once | INTRAMUSCULAR | Status: AC
  Administered 2023-06-08: 40 mg via INTRAMUSCULAR

## 2023-06-08 NOTE — Discharge Instructions (Signed)
 The x-ray reading we discussed is preliminary. Your x-ray will be read by a radiologist in next few hours. If there is a discrepancy, you will be contacted, and instructed on a new plan for you care.    The counter extra strength Tylenol 2 tablets every 6 hours as needed for pain

## 2023-06-08 NOTE — ED Provider Notes (Signed)
 [name]    CSN: [id number] Arrival date & time: 06/08/23  1104      History   Chief Complaint Chief Complaint  Patient presents with   Shoulder Pain    HPI Raymond Graham is a 73 y.o. male.   The history is provided by the patient.  Shoulder Pain Right shoulder pain for several years without known injury, states pain has been worse over the last few days, pain is mostly on the top of the shoulder, worse with certain movements such as reaching into his pockets or reaching head.  Sometimes pain radiates down right arm.  Admits history of arthritis, multiple joint replacements.  Has an appointment with his primary care doctor in 2 weeks  Past Medical History:  Diagnosis Date   Arthritis    PAIN AND ARTHRITIS LEFT HIP AND LEFT KNEE osteoarthritis   BPH (benign prostatic hypertrophy)    PT ON FLOMAX-DR. 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/2017   Primary localized osteoarthritis of right knee 02/07/2017   Primary osteoarthritis of right knee 02/07/2017   Perforated chronic peptic ulcer ([facility]) 07/23/2016   S/P left TKA 11/22/2011   S/P left THA, AA 05/31/2011    Past Surgical History:  Procedure Laterality Date   BACK SURGERY  10/2008   LUMBAR SURGERY FOR STENOSIS   COLONOSCOPY     JOINT REPLACEMENT  2005   RIGHT HIP REPLACEMENT    LAPAROTOMY N/A 07/23/2016   Procedure: EXPLORATORY LAPAROTOMY WITH GRAHAM PATCH REPAIR OF PYLORIC ULCER/HERNIA;  Surgeon: Almond Lint, MD;  Location[facility];  Service: General;  Laterality: N/A;   LEFT BUNIONECTOMY     LEFT GANGLION CYST REMOVED     TOTAL HIP ARTHROPLASTY  05/31/2011   Procedure: TOTAL HIP ARTHROPLASTY ANTERIOR APPROACH;  (BILATERAL)Surgeon: Shelda Pal, MD;  [facility];   Service: Orthopedics;  Laterality: Left;   TOTAL HIP REVISION Right 05/04/2020   Procedure: RIGHT TOTAL HIP REVISION OF BEARING POSTERIOR APPROACH;  Surgeon: Gean Birchwood, MD;  [facility];  Service: Orthopedics;  Laterality: Right;   TOTAL KNEE ARTHROPLASTY  11/21/2011   Procedure: TOTAL KNEE ARTHROPLASTY;  Surgeon: Shelda Pal, MD;  [facility];  Service: Orthopedics;  Laterality: Left;   TOTAL KNEE ARTHROPLASTY Right 02/07/2017   Procedure: TOTAL KNEE ARTHROPLASTY;  Surgeon: Marcene Corning, MD;  Location[facility];  Service: Orthopedics;  Laterality: Right;   TOTAL SHOULDER ARTHROPLASTY Left 05/18/2017   TOTAL SHOULDER ARTHROPLASTY Left 05/18/2017   Procedure: LEFT TOTAL SHOULDER ARTHROPLASTY;  Surgeon: Jones Broom, MD;  Location[facility];  Service: Orthopedics;  Laterality: Left;       Home Medications    Prior to Admission medications   Medication Sig Start Date End Date Taking? Authorizing Provider  amLODipine (NORVASC) 10 MG tablet Take 5 mg by mouth daily.    [provider]  benzonatate (TESSALON) 100 MG capsule Take 1 capsule (100 mg total) by mouth every 8 (eight) hours as needed for cough. 06/03/22   Gustavus Bryant, FNP  dapagliflozin propanediol (FARXIGA) 10 MG TABS tablet Take by mouth daily.    [provider]  fluticasone (FLONASE) 50 MCG/ACT nasal spray Place 1 spray into both nostrils daily. 06/03/22  [place], [place] [address], [place]  metoprolol (TOPROL-XL) 200 MG 24 hr tablet Take 200 mg by mouth daily. 11/30/16   [provider]  Multiple Vitamin (MULTIVITAMIN WITH MINERALS) TABS Take 1 tablet by mouth daily.    [provider]  olmesartan (BENICAR) 20 MG tablet Take 40 mg by mouth daily.    [provider]  omeprazole (PRILOSEC) 20 MG capsule Take 20 mg by mouth daily as needed (acid reflux). 12/30/16   [provider]  PARoxetine (PAXIL) 20 MG tablet Take 20 mg by mouth daily.    [provider]  rosuvastatin  (CRESTOR) 10 MG tablet Take 10 mg by mouth daily.    [provider]  tamsulosin (FLOMAX) 0.4 MG CAPS capsule TAKE 1 CAPSULE BY MOUTH  DAILY Patient taking differently: Take 0.4 mg by mouth daily. 12/10/19   Bjorn Pippin, MD    Family History History reviewed. No pertinent family history.  Social History Social History   Tobacco Use   Smoking status: Former    Current packs/day: 0.00    Types: Cigarettes    Quit date: 04/12/1991    Years since quitting: 32.1   Smokeless tobacco: Never   Tobacco comments:    QUIT SMOKING ABOUT 15 OR 20 YRS AGO-ONLY SMOKED ON WEEKENDS  Vaping Use   Vaping status: Never Used  Substance Use Topics   Alcohol use: Yes    Alcohol/week: 2.0 standard drinks of alcohol    Types: 2 Shots of liquor per week    Comment: occasionally   Drug use: No     Allergies   Patient has no known allergies.   Review of Systems Review of Systems   Physical Exam Triage Vital Signs ED Triage Vitals  Encounter Vitals Group     BP 06/08/23 1135 (!) 150/88     Systolic BP Percentile --      Diastolic BP Percentile --      Pulse Rate 06/08/23 1135 (!) 59     Resp 06/08/23 1135 18     Temp 06/08/23 1135 97.6 F (36.4 C)     Temp Source 06/08/23 1135 Oral     SpO2 06/08/23 1135 95 %     Weight 06/08/23 1134 210 lb (95.3 kg)     Height 06/08/23 1134 5\' 8"  (1.727 m)     Head Circumference --      Peak Flow --      Pain Score 06/08/23 1134 8     Pain Loc --      Pain Education --      Exclude from Growth Chart --    No data found.  Updated Vital Signs BP (!) 150/88 (BP Location: Right Arm)   Pulse (!) 59   Temp 97.6 F (36.4 C) (Oral)   Resp 18   Ht 5\' 8"  (1.727 m)   Wt 210 lb (95.3 kg)   SpO2 95%   BMI 31.93 kg/m   Visual Acuity Right Eye Distance:   Left Eye Distance:   Bilateral Distance:    Right Eye Near:   Left Eye Near:    Bilateral Near:     Physical Exam   UC Treatments / Results  Labs (all labs ordered are listed, but  only abnormal results are displayed) Labs Reviewed - No data to display  EKG   Radiology No results found.  Procedures Procedures (including critical care time)  Medications Ordered in UC Medications - No data to display  Initial Impression /  Assessment and Plan / UC Course  I have reviewed the triage vital signs and the nursing notes.  Pertinent labs & imaging results that were available during my care of the patient were reviewed by me and considered in my medical decision making (see chart for details).     73 year old male with history of osteoarthritis presents with right shoulder pain for several years which has gotten worse over the last few days.,  Has mild tenderness on exam and limited abduction external rotation, no deformity.  Shoulder x-rays independently viewed by me degenerative changes loss of joint space no acute fracture Patient has a history of peptic ulcer disease for cannot take oral NSAIDs, will give shot of Kenalog, he will follow-up with his PCP that his orthopedist Final Clinical Impressions(s) / UC Diagnoses   Final diagnoses:  Chronic right shoulder pain   Discharge Instructions   None    ED Prescriptions   None    PDMP not reviewed this encounter.   Meliton Rattan, [place] 06/08/23 1228

## 2023-06-08 NOTE — ED Triage Notes (Signed)
 Pt presents with complaints of right shoulder pain. Pt states this is typically a chronic pain however in the past weeks, pain has worsened. Pt reports his pain is radiating down his right arm into his right wrist area. Pt currently rates his overall pain an 8/10. Pt is using his left arm to compensate due to the pain. PCP appointment scheduled for 3/11. OTC Tylenol, epsom salt soaks, applying heat/ice, and pain relief patches with no relief.

## 2023-07-12 ENCOUNTER — Other Ambulatory Visit: Payer: Self-pay | Admitting: Family Medicine

## 2023-07-12 DIAGNOSIS — G8929 Other chronic pain: Secondary | ICD-10-CM

## 2023-07-18 ENCOUNTER — Ambulatory Visit
Admission: RE | Admit: 2023-07-18 | Discharge: 2023-07-18 | Disposition: A | Discharge status: Home / Self Care | Source: Ambulatory Visit | Attending: Family Medicine | Admitting: Family Medicine

## 2023-07-18 ENCOUNTER — Other Ambulatory Visit: Payer: Self-pay | Admitting: Family Medicine

## 2023-07-18 DIAGNOSIS — R059 Cough, unspecified: Secondary | ICD-10-CM

## 2023-07-18 DIAGNOSIS — G8929 Other chronic pain: Secondary | ICD-10-CM

## 2023-08-03 ENCOUNTER — Other Ambulatory Visit: Payer: Self-pay

## 2023-08-03 ENCOUNTER — Inpatient Hospital Stay (HOSPITAL_COMMUNITY)
Admission: EM | Admit: 2023-08-03 | Discharge: 2023-08-05 | DRG: 315 | Disposition: A | Discharge status: Home / Self Care | Attending: Internal Medicine | Admitting: Internal Medicine

## 2023-08-03 ENCOUNTER — Encounter (HOSPITAL_COMMUNITY): Payer: Self-pay | Admitting: Emergency Medicine

## 2023-08-03 ENCOUNTER — Emergency Department (HOSPITAL_COMMUNITY)

## 2023-08-03 DIAGNOSIS — Z96641 Presence of right artificial hip joint: Secondary | ICD-10-CM | POA: Diagnosis present

## 2023-08-03 DIAGNOSIS — J929 Pleural plaque without asbestos: Secondary | ICD-10-CM

## 2023-08-03 DIAGNOSIS — K219 Gastro-esophageal reflux disease without esophagitis: Secondary | ICD-10-CM | POA: Diagnosis present

## 2023-08-03 DIAGNOSIS — R55 Syncope and collapse: Secondary | ICD-10-CM

## 2023-08-03 DIAGNOSIS — Z79899 Other long term (current) drug therapy: Secondary | ICD-10-CM

## 2023-08-03 DIAGNOSIS — E860 Dehydration: Secondary | ICD-10-CM | POA: Diagnosis present

## 2023-08-03 DIAGNOSIS — E861 Hypovolemia: Principal | ICD-10-CM | POA: Diagnosis present

## 2023-08-03 DIAGNOSIS — Z8711 Personal history of peptic ulcer disease: Secondary | ICD-10-CM

## 2023-08-03 DIAGNOSIS — E66811 Obesity, class 1: Secondary | ICD-10-CM | POA: Diagnosis present

## 2023-08-03 DIAGNOSIS — E871 Hypo-osmolality and hyponatremia: Secondary | ICD-10-CM | POA: Diagnosis not present

## 2023-08-03 DIAGNOSIS — I9589 Other hypotension: Secondary | ICD-10-CM | POA: Diagnosis not present

## 2023-08-03 DIAGNOSIS — Z7985 Long-term (current) use of injectable non-insulin antidiabetic drugs: Secondary | ICD-10-CM

## 2023-08-03 DIAGNOSIS — Z87891 Personal history of nicotine dependence: Secondary | ICD-10-CM

## 2023-08-03 DIAGNOSIS — E785 Hyperlipidemia, unspecified: Secondary | ICD-10-CM | POA: Diagnosis present

## 2023-08-03 DIAGNOSIS — M1712 Unilateral primary osteoarthritis, left knee: Secondary | ICD-10-CM | POA: Diagnosis present

## 2023-08-03 DIAGNOSIS — Z96651 Presence of right artificial knee joint: Secondary | ICD-10-CM | POA: Diagnosis present

## 2023-08-03 DIAGNOSIS — N401 Enlarged prostate with lower urinary tract symptoms: Secondary | ICD-10-CM | POA: Diagnosis present

## 2023-08-03 DIAGNOSIS — Z6830 Body mass index (BMI) 30.0-30.9, adult: Secondary | ICD-10-CM

## 2023-08-03 DIAGNOSIS — I10 Essential (primary) hypertension: Secondary | ICD-10-CM | POA: Diagnosis present

## 2023-08-03 DIAGNOSIS — R9389 Abnormal findings on diagnostic imaging of other specified body structures: Secondary | ICD-10-CM

## 2023-08-03 DIAGNOSIS — N179 Acute kidney failure, unspecified: Secondary | ICD-10-CM

## 2023-08-03 DIAGNOSIS — I959 Hypotension, unspecified: Secondary | ICD-10-CM | POA: Diagnosis present

## 2023-08-03 DIAGNOSIS — R918 Other nonspecific abnormal finding of lung field: Secondary | ICD-10-CM | POA: Diagnosis present

## 2023-08-03 DIAGNOSIS — M7989 Other specified soft tissue disorders: Secondary | ICD-10-CM | POA: Diagnosis present

## 2023-08-03 DIAGNOSIS — M1612 Unilateral primary osteoarthritis, left hip: Secondary | ICD-10-CM | POA: Diagnosis present

## 2023-08-03 DIAGNOSIS — Z96612 Presence of left artificial shoulder joint: Secondary | ICD-10-CM | POA: Diagnosis present

## 2023-08-03 LAB — CBC WITH DIFFERENTIAL/PLATELET
Abs Immature Granulocytes: 0.03 10*3/uL (ref 0.00–0.07)
Basophils Absolute: 0 10*3/uL (ref 0.0–0.1)
Basophils Relative: 0 %
Eosinophils Absolute: 0.1 10*3/uL (ref 0.0–0.5)
Eosinophils Relative: 1 %
HCT: 42.8 % (ref 39.0–52.0)
Hemoglobin: 13.9 g/dL (ref 13.0–17.0)
Immature Granulocytes: 0 %
Lymphocytes Relative: 9 %
Lymphs Abs: 0.8 10*3/uL (ref 0.7–4.0)
MCH: 30.3 pg (ref 26.0–34.0)
MCHC: 32.5 g/dL (ref 30.0–36.0)
MCV: 93.2 fL (ref 80.0–100.0)
Monocytes Absolute: 0.8 10*3/uL (ref 0.1–1.0)
Monocytes Relative: 10 %
Neutro Abs: 6.8 10*3/uL (ref 1.7–7.7)
Neutrophils Relative %: 80 %
Platelets: 192 10*3/uL (ref 150–400)
RBC: 4.59 MIL/uL (ref 4.22–5.81)
RDW: 14 % (ref 11.5–15.5)
WBC: 8.5 10*3/uL (ref 4.0–10.5)
nRBC: 0 % (ref 0.0–0.2)

## 2023-08-03 LAB — URINALYSIS, ROUTINE W REFLEX MICROSCOPIC
Bacteria, UA: NONE SEEN
Bilirubin Urine: NEGATIVE
Glucose, UA: 50 mg/dL — AB
Ketones, ur: NEGATIVE mg/dL
Leukocytes,Ua: NEGATIVE
Nitrite: NEGATIVE
Protein, ur: NEGATIVE mg/dL
Specific Gravity, Urine: 1.008 (ref 1.005–1.030)
pH: 5 (ref 5.0–8.0)

## 2023-08-03 LAB — COMPREHENSIVE METABOLIC PANEL WITH GFR
ALT: 19 U/L (ref 0–44)
AST: 24 U/L (ref 15–41)
Albumin: 3.7 g/dL (ref 3.5–5.0)
Alkaline Phosphatase: 57 U/L (ref 38–126)
Anion gap: 10 (ref 5–15)
BUN: 54 mg/dL — ABNORMAL HIGH (ref 8–23)
CO2: 20 mmol/L — ABNORMAL LOW (ref 22–32)
Calcium: 8.7 mg/dL — ABNORMAL LOW (ref 8.9–10.3)
Chloride: 102 mmol/L (ref 98–111)
Creatinine, Ser: 3.44 mg/dL — ABNORMAL HIGH (ref 0.61–1.24)
GFR, Estimated: 18 mL/min — ABNORMAL LOW (ref >60)
Glucose, Bld: 117 mg/dL — ABNORMAL HIGH (ref 70–99)
Potassium: 4.5 mmol/L (ref 3.5–5.1)
Sodium: 132 mmol/L — ABNORMAL LOW (ref 135–145)
Total Bilirubin: 0.3 mg/dL (ref 0.0–1.2)
Total Protein: 6.6 g/dL (ref 6.5–8.1)

## 2023-08-03 LAB — TROPONIN I (HIGH SENSITIVITY): Troponin I (High Sensitivity): 2 ng/L (ref <18)

## 2023-08-03 LAB — CREATININE, SERUM
Creatinine, Ser: 2.82 mg/dL — ABNORMAL HIGH (ref 0.61–1.24)
GFR, Estimated: 23 mL/min — ABNORMAL LOW (ref >60)

## 2023-08-03 LAB — D-DIMER, QUANTITATIVE: D-Dimer, Quant: 0.39 ug{FEU}/mL (ref 0.00–0.50)

## 2023-08-03 LAB — SODIUM, URINE, RANDOM: Sodium, Ur: 46 mmol/L

## 2023-08-03 LAB — MAGNESIUM: Magnesium: 2.6 mg/dL — ABNORMAL HIGH (ref 1.7–2.4)

## 2023-08-03 MED ORDER — ALBUTEROL SULFATE (2.5 MG/3ML) 0.083% IN NEBU: 2.5000 mg | INHALATION_SOLUTION | Freq: Four times a day (QID) | RESPIRATORY_TRACT | Status: DC | PRN

## 2023-08-03 MED ORDER — TAMSULOSIN HCL 0.4 MG PO CAPS
0.4000 mg | ORAL_CAPSULE | Freq: Every day | ORAL | Status: DC
  Administered 2023-08-04 – 2023-08-05 (×2): 0.4 mg via ORAL
  Filled 2023-08-03 (×2): qty 1

## 2023-08-03 MED ORDER — MELATONIN 5 MG PO TABS
5.0000 mg | ORAL_TABLET | Freq: Every evening | ORAL | Status: DC | PRN
  Administered 2023-08-03 – 2023-08-04 (×2): 5 mg via ORAL
  Filled 2023-08-03 (×2): qty 1

## 2023-08-03 MED ORDER — ACETAMINOPHEN 650 MG RE SUPP: 650.0000 mg | Freq: Four times a day (QID) | RECTAL | Status: DC | PRN

## 2023-08-03 MED ORDER — SODIUM CHLORIDE 0.9% FLUSH
3.0000 mL | Freq: Two times a day (BID) | INTRAVENOUS | Status: DC
  Administered 2023-08-03 – 2023-08-05 (×4): 3 mL via INTRAVENOUS

## 2023-08-03 MED ORDER — ACETAMINOPHEN 325 MG PO TABS: 650.0000 mg | ORAL_TABLET | Freq: Four times a day (QID) | ORAL | Status: DC | PRN

## 2023-08-03 MED ORDER — HEPARIN SODIUM (PORCINE) 5000 UNIT/ML IJ SOLN
5000.0000 [IU] | Freq: Three times a day (TID) | INTRAMUSCULAR | Status: DC
  Administered 2023-08-04 – 2023-08-05 (×4): 5000 [IU] via SUBCUTANEOUS
  Filled 2023-08-03 (×4): qty 1

## 2023-08-03 MED ORDER — SODIUM CHLORIDE 0.9 % IV BOLUS
500.0000 mL | Freq: Once | INTRAVENOUS | Status: AC
  Administered 2023-08-03: 500 mL via INTRAVENOUS

## 2023-08-03 MED ORDER — ROSUVASTATIN CALCIUM 10 MG PO TABS
10.0000 mg | ORAL_TABLET | Freq: Every day | ORAL | Status: DC
  Administered 2023-08-03 – 2023-08-04 (×2): 10 mg via ORAL
  Filled 2023-08-03 (×2): qty 1

## 2023-08-03 MED ORDER — LACTATED RINGERS IV BOLUS
1000.0000 mL | Freq: Once | INTRAVENOUS | Status: AC
  Administered 2023-08-03: 1000 mL via INTRAVENOUS

## 2023-08-03 MED ORDER — PANTOPRAZOLE SODIUM 40 MG PO TBEC
40.0000 mg | DELAYED_RELEASE_TABLET | Freq: Every day | ORAL | Status: DC
  Administered 2023-08-04 – 2023-08-05 (×2): 40 mg via ORAL
  Filled 2023-08-03 (×2): qty 1

## 2023-08-03 MED ORDER — GABAPENTIN 100 MG PO CAPS
100.0000 mg | ORAL_CAPSULE | Freq: Three times a day (TID) | ORAL | Status: DC
  Administered 2023-08-03 – 2023-08-05 (×5): 100 mg via ORAL
  Filled 2023-08-03 (×5): qty 1

## 2023-08-03 MED ORDER — POLYETHYLENE GLYCOL 3350 17 G PO PACK: 17.0000 g | PACK | Freq: Every day | ORAL | Status: DC | PRN

## 2023-08-03 MED ORDER — LACTATED RINGERS IV SOLN: INTRAVENOUS | Status: AC

## 2023-08-03 MED ORDER — CALCIUM CARBONATE ANTACID 500 MG PO CHEW
1.0000 | CHEWABLE_TABLET | ORAL | Status: DC | PRN
Start: 2023-08-03 — End: 2023-08-05

## 2023-08-03 NOTE — ED Notes (Signed)
Lavender and light green tube sent to lab. 

## 2023-08-03 NOTE — ED Triage Notes (Signed)
 Patient present due to hypotension since yesterday. Lowest BP was 77/51 with a pulse of 74. Most other readings have been around 81/55. Patient BP was below normal in triage. He reports fatigue and lightheadedness, but denies shortness of breath.

## 2023-08-03 NOTE — ED Provider Triage Note (Signed)
 Emergency Medicine Provider Triage Evaluation Note  CORBET HANLEY , a 73 y.o. male  was evaluated in triage.  Pt complains of hypotension. Patient reportedly has been having low BP for the last two days or so. He is on BP medications but states that he did not take them yesterday or today due to feeling weak and seeing that BP was low. Has had some ongoing issues with diffuse muscle cramps that are not new for patient . He did recently have a prescription for Tizanidine  filled by PCP for muscle cramps but he states he has taken this previously with no issue. Denies chest pain, shortness of breath, or abdominal pain.  Review of Systems  Positive: As above Negative: As above  Physical Exam  BP (!) 89/63 (BP Location: Left Arm)   Pulse 74   Temp (!) 97.5 F (36.4 C) (Oral)   Resp 17   Ht 5\' 8"  (1.727 m)   Wt 92.1 kg   SpO2 96%   BMI 30.87 kg/m  Gen:   Awake, no distress   Resp:  Normal effort  MSK:   Moves extremities without difficulty  Other:    Medical Decision Making  Medically screening exam initiated at 2:18 PM.  Appropriate orders placed.  LISANDRO MEGGETT was informed that the remainder of the evaluation will be completed by another provider, this initial triage assessment does not replace that evaluation, and the importance of remaining in the ED until their evaluation is complete.     Daylin Gruszka A, PA-C 08/03/23 1420

## 2023-08-03 NOTE — H&P (Signed)
 History and Physical    Patient: Raymond Graham:[id number] DOB: 04/03/1951 DOA: 08/03/2023 DOS: the patient was seen and examined on 08/03/2023 PCP: Ulysees Gander, MD  Patient coming from: Home  Chief Complaint:  Chief Complaint  Patient presents with   Hypotension   HPI: Raymond Graham is a 73 y.o. male with medical history significant of hypertension.  Patient actually uses antihypertensives at home.  Patient monitors his own blood pressure at home frequently, typically numbers are 135/80.  Patient reports being in his usual state of health till about 3 days ago when he started feeling "lightheaded "further clarified as presyncope like symptoms.  Whenever patient would stand up from laying down position or exert himself.  Patient denies any vomiting diarrhea any chest pain trouble breathing.  Patient has had chronic on and off leg swelling.  Patient is pending a lower extremity Doppler which has not been completed. No fever, rigor etc.  Patient describes actually having at least 1 episode of loss of consciousness while getting out of heart pool about 48 hours ago.  Which was self-limited and patient was able to get back up and leave.  Patient finally came to the ER today because of persistent symptoms as above.  On arrival, patient's systolic blood pressure was in the 80s.  Patient has received fluid boluses and blood pressure has come up to 114/72.  Patient denies any urinary habit change, although he does have some chronic lower urinary tract symptoms which are attributed to his BPH.  Patient is actually feeling now much better without any lightheadedness.  Medical evaluation is sought.  Review of Systems: As mentioned in the history of present illness. All other systems reviewed and are negative. 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 WRENN IS PT'S  UROLOGIST   GERD (gastroesophageal reflux disease)    OCAS-NO MEDS   Hypertension    Seasonal allergies    Past Surgical History:  Procedure Laterality Date   BACK SURGERY  10/2008   LUMBAR SURGERY FOR STENOSIS   COLONOSCOPY     JOINT REPLACEMENT  2005   RIGHT HIP REPLACEMENT    LAPAROTOMY N/A 07/23/2016   Procedure: EXPLORATORY LAPAROTOMY WITH GRAHAM PATCH REPAIR OF PYLORIC ULCER/HERNIA;  Surgeon: Lockie Rima, MD;  Location: [facility];  Service: General;  Laterality: N/A;   LEFT BUNIONECTOMY     LEFT GANGLION CYST REMOVED     TOTAL HIP ARTHROPLASTY  05/31/2011   Procedure: TOTAL HIP ARTHROPLASTY ANTERIOR APPROACH;  (BILATERAL)Surgeon: Bevin Bucks, MD;  Location: [facility];  Service: Orthopedics;  Laterality: Left;   TOTAL HIP REVISION Right 05/04/2020   Procedure: RIGHT TOTAL HIP REVISION OF BEARING POSTERIOR APPROACH;  Surgeon: Wendolyn Hamburger, MD;  Location: [facility];  Service: Orthopedics;  Laterality: Right;   TOTAL KNEE ARTHROPLASTY  11/21/2011   Procedure: TOTAL KNEE ARTHROPLASTY;  Surgeon: Bevin Bucks, MD;  Location: [facility];  Service: Orthopedics;  Laterality: Left;   TOTAL KNEE ARTHROPLASTY Right 02/07/2017   Procedure: TOTAL KNEE ARTHROPLASTY;  Surgeon: Dayne Even, MD;  Location: [facility];  Service: Orthopedics;  Laterality: Right;   TOTAL SHOULDER ARTHROPLASTY Left 05/18/2017   TOTAL SHOULDER ARTHROPLASTY Left 05/18/2017   Procedure: LEFT TOTAL SHOULDER ARTHROPLASTY;  Surgeon: Sammye Cristal, MD;  Location: [facility] OR;  Service: Orthopedics;  Laterality: Left;   Social History:  reports that he quit smoking about 32 years ago. His smoking use included cigarettes. He has never used smokeless tobacco. He reports current alcohol use of about 2.0 standard drinks of alcohol per week. He reports that he does not use drugs.  No Known Allergies  History reviewed. No pertinent family history.  Prior to Admission medications   Medication Sig Start Date End Date Taking? Authorizing Provider   ACETAMINOPHEN -CODEINE PO Take 1 tablet by mouth every 4 (four) hours as needed for moderate pain (pain score 4-6).   Yes [provider]  albuterol  (VENTOLIN  HFA) 108 (90 Base) MCG/ACT inhaler Inhale 1-2 puffs into the lungs every 6 (six) hours as needed for wheezing or shortness of breath. 08/01/23  Yes [provider]  amLODipine (NORVASC) 10 MG tablet Take 5 mg by mouth at bedtime.   Yes [provider]  calcium  carbonate (TUMS - DOSED IN MG ELEMENTAL CALCIUM ) 500 MG chewable tablet Chew 1-2 tablets by mouth as needed for indigestion or heartburn.   Yes [provider]  cetirizine (ZYRTEC) 10 MG tablet Take 10 mg by mouth daily. 08/01/23  Yes [provider]  dapagliflozin propanediol (FARXIGA) 10 MG TABS tablet Take by mouth daily.   Yes [provider]  fluticasone  (FLONASE ) 50 MCG/ACT nasal spray Place 1 spray into both nostrils daily. 06/03/22  Yes [place], Fairy Homer E, FNP  furosemide (LASIX) 20 MG tablet Take 20 mg by mouth daily. 07/19/23  Yes [provider]  gabapentin  (NEURONTIN ) 100 MG capsule Take 100 mg by mouth 3 (three) times daily. 05/31/23  Yes [provider]  metoprolol  (TOPROL -XL) 200 MG 24 hr tablet Take 200 mg by mouth in the morning. 11/30/16  Yes [provider]  Multiple Vitamin (MULTIVITAMIN WITH MINERALS) TABS Take 1 tablet by mouth daily.   Yes [provider]  olmesartan (BENICAR) 40 MG tablet Take 40 mg by mouth in the morning.   Yes [provider]  omeprazole (PRILOSEC) 20 MG capsule Take 20 mg by mouth as needed (acid reflux). 12/30/16  Yes [provider]  OZEMPIC, 0.25 OR 0.5 MG/DOSE, 2 MG/3ML SOPN Inject 0.5 mg into the skin once a week. On Saturdays 07/25/23  Yes [provider]  rosuvastatin  (CRESTOR ) 10 MG tablet Take 10 mg by mouth at bedtime.   Yes [provider]  sildenafil (REVATIO) 20 MG tablet Take 2-5 tablets by mouth as needed.   Yes  [provider]  spironolactone (ALDACTONE) 25 MG tablet Take 25 mg by mouth in the morning. 05/29/23  Yes [provider]  tadalafil (CIALIS) 5 MG tablet Take 5 mg by mouth daily. 07/10/23  Yes [provider]  tamsulosin  (FLOMAX ) 0.4 MG CAPS capsule TAKE 1 CAPSULE BY MOUTH  DAILY Patient taking differently: Take 0.4 mg by mouth daily. 12/10/19  Yes Homero Luster, MD  tiZANidine  (ZANAFLEX ) 2 MG tablet Take 2 mg by mouth 2 (two) times daily. Patient not taking: Reported on 08/03/2023 08/03/23   [provider]  traZODone  (DESYREL ) 50 MG tablet Take 50 mg by mouth at bedtime. Patient not taking: Reported on 08/03/2023 08/01/23   [provider]   Denies taking sildenafil or tadalafil in the last 7 days. Physical Exam: Vitals:   08/03/23 1439 08/03/23 1515 08/03/23 1545 08/03/23 1630  BP: (!) 94/57 (!) 93/59 98/66 114/72  Pulse: 71 68 68 66  Resp: 17 15 18 15   Temp:      TempSrc:      SpO2:  93% (!) 89% 91% 97%  Weight:      Height:       General: Patient is alert and awake, appears fatigued, speaks in a slow raspy voice.  But does not appear to have any distress and gives a reasonably coherent account of his symptoms. No jugular venous distention Cardiovascular exam S1-S2 normal Abdomen all quadrants are soft nontender Extremities warm without edema no focal deficit. Data Reviewed:  Labs on Admission:  Results for orders placed or performed during the hospital encounter of 08/03/23 (from the past 24 hours)  CBC with Differential     Status: None   Collection Time: 08/03/23  2:21 PM  Result Value Ref Range   WBC 8.5 4.0 - 10.5 K/uL   RBC 4.59 4.22 - 5.81 MIL/uL   Hemoglobin 13.9 13.0 - 17.0 g/dL   HCT 16.1 09.6 - 04.5 %   MCV 93.2 80.0 - 100.0 fL   MCH 30.3 26.0 - 34.0 pg   MCHC 32.5 30.0 - 36.0 g/dL   RDW 40.9 81.1 - 91.4 %   Platelets 192 150 - 400 K/uL   nRBC 0.0 0.0 - 0.2 %   Neutrophils Relative % 80 %   Neutro Abs 6.8 1.7 - 7.7 K/uL    Lymphocytes Relative 9 %   Lymphs Abs 0.8 0.7 - 4.0 K/uL   Monocytes Relative 10 %   Monocytes Absolute 0.8 0.1 - 1.0 K/uL   Eosinophils Relative 1 %   Eosinophils Absolute 0.1 0.0 - 0.5 K/uL   Basophils Relative 0 %   Basophils Absolute 0.0 0.0 - 0.1 K/uL   Immature Granulocytes 0 %   Abs Immature Granulocytes 0.03 0.00 - 0.07 K/uL  Comprehensive metabolic panel     Status: Abnormal   Collection Time: 08/03/23  2:21 PM  Result Value Ref Range   Sodium 132 (L) 135 - 145 mmol/L   Potassium 4.5 3.5 - 5.1 mmol/L   Chloride 102 98 - 111 mmol/L   CO2 20 (L) 22 - 32 mmol/L   Glucose, Bld 117 (H) 70 - 99 mg/dL   BUN 54 (H) 8 - 23 mg/dL   Creatinine, Ser 7.82 (H) 0.61 - 1.24 mg/dL   Calcium  8.7 (L) 8.9 - 10.3 mg/dL   Total Protein 6.6 6.5 - 8.1 g/dL   Albumin 3.7 3.5 - 5.0 g/dL   AST 24 15 - 41 U/L   ALT 19 0 - 44 U/L   Alkaline Phosphatase 57 38 - 126 U/L   Total Bilirubin 0.3 0.0 - 1.2 mg/dL   GFR, Estimated 18 (L) >60 mL/min   Anion gap 10 5 - 15  Troponin I (High Sensitivity)     Status: None   Collection Time: 08/03/23  4:14 PM  Result Value Ref Range   Troponin I (High Sensitivity) 2 <18 ng/L   Basic Metabolic Panel: Recent Labs  Lab 08/03/23 1421  NA 132*  K 4.5  CL 102  CO2 20*  GLUCOSE 117*  BUN 54*  CREATININE 3.44*  CALCIUM  8.7*   Liver Function Tests: Recent Labs  Lab 08/03/23 1421  AST 24  ALT 19  ALKPHOS 57  BILITOT 0.3  PROT 6.6  ALBUMIN 3.7   No results for input(s): "LIPASE", "AMYLASE" in the last 168 hours. No results for input(s): "AMMONIA" in the last 168 hours. CBC: Recent Labs  Lab 08/03/23 1421  WBC 8.5  NEUTROABS 6.8  HGB 13.9  HCT 42.8  MCV 93.2  PLT 192  Cardiac Enzymes: Recent Labs  Lab 08/03/23 1614  TROPONINIHS 2    BNP (last 3 results) No results for input(s): "PROBNP" in the last 8760 hours. CBG: No results for input(s): "GLUCAP" in the last 168 hours.  Radiological Exams on Admission:  DG Chest Port 1  View Result Date: 08/03/2023 CLINICAL DATA:  Hypotension and weakness. EXAM: PORTABLE CHEST 1 VIEW COMPARISON:  Chest x-ray 07/18/2023. FINDINGS: The heart size and mediastinal contours are within normal limits. Both lungs are clear. No pleural effusion or pneumothorax. Focal air pleural thickening is seen in the lower left hemithorax near the costophrenic angle measuring up to 15 mm, indeterminate. Left shoulder arthroplasty present. Cervical spinal fusion plate is present. IMPRESSION: 1. No active disease. 2. Focal air pleural thickening in the lower left hemithorax near the costophrenic angle measuring up to 15 mm, indeterminate. Recommend further evaluation with chest CT. Electronically Signed   By: Tyron Gallon M.D.   On: 08/03/2023 16:43     EKG: Independently reviewed. NSR  No intake/output data recorded. Total I/O In: 500 [IV Piggyback:500] Out: -      Assessment and Plan: * Hypotension Patient presents with almost 3 days of presyncope and syncope like symptoms.  Found to have documented hypotension in the ER as well as per patient report at home last 3 days.  No chest pain shortness of breath.  Patient blood pressure has improved with IV fluids.  Patient is chronically hypertensive and is on antihypertensive regimen.  I think antihypertensive overdose is unlikely over the 3 days that the patient has had hypotension/presyncope at home.  Therefore will look for underlying causes.  Possible dehydration, although patient denies any vomiting or diarrhea - he is on lasix - hold same.  Troponin is negative, I will proceed and check an echo, will rule out venous thromboembolism with lower extremity Dopplers.  I will check TSH and cortisol and a blood culture. Hold anti HTN regimen.  Pleural thickening Incidental. Patient not reporting and resp symptoms . Will check D-dimer as well. Will order CT after creatnine improved and based on D-dimer values.  AKI (acute kidney injury) (HCC) 1.31>3.44.  urine studies pending. Will hcekc bladdder scan. However this is strongly prerenal and will monitor patietn resposne to iv hydration.  Hyponatremia Mild chronic asymptomatic. Check osmolality. Pending tsh cortiosl. Trend. Got iv fluids for hypotension.  Hold benicar, toprol  xl and amlodipine til BP improved.  DVT pxx - heparin  SQ, check inr ptt    Advance Care Planning:   Code Status: Full Code   Consults: none at this time.  Family Communication: per pateint.  Severity of Illness: The appropriate patient status for this patient is OBSERVATION. Observation status is judged to be reasonable and necessary in order to provide the required intensity of service to ensure the patient's safety. The patient's presenting symptoms, physical exam findings, and initial radiographic and laboratory data in the context of their medical condition is felt to place them at decreased risk for further clinical deterioration. Furthermore, it is anticipated that the patient will be medically stable for discharge from the hospital within 2 midnights of admission.   Author: Bennie Brave, MD 08/03/2023 5:52 PM  For on call review [email or web address].

## 2023-08-03 NOTE — Assessment & Plan Note (Addendum)
 Mild chronic asymptomatic. Check osmolality. Pending tsh cortiosl. Trend. Got iv fluids for hypotension.  Hold benicar, toprol  xl and amlodipine til BP improved.

## 2023-08-03 NOTE — ED Notes (Signed)
Called lab to add on Troponin 

## 2023-08-03 NOTE — Assessment & Plan Note (Signed)
 1.31>3.44. urine studies pending. Will hcekc bladdder scan. However this is strongly prerenal and will monitor patietn resposne to iv hydration.

## 2023-08-03 NOTE — ED Notes (Signed)
 This nurse attempted to ambulate pt and record orthostatic bp per orders, pt stated he would like to wait until he received the remaining of his LR bolus.

## 2023-08-03 NOTE — Assessment & Plan Note (Signed)
 Incidental. Patient not reporting and resp symptoms . Will check D-dimer as well. Will order CT after creatnine improved and based on D-dimer values.

## 2023-08-03 NOTE — ED Provider Notes (Signed)
 [place] EMERGENCY DEPARTMENT AT [facility] Provider Note   CSN: [id number] Arrival date & time: 08/03/23  1350     History  Chief Complaint  Patient presents with   Hypotension    Raymond Graham is a 73 y.o. male.  Patient with c/o generally feeling weak, lightheaded when stands, in past few days. Indicates a few days ago, was in hot tub, stood up, felt faint, ?brief loc, hit head. Denies headache. No neck or back pain. Denies extremity pain, injury, swelling or redness. Denies chest pain or discomfort. No sob or unusual doe. No abd pain or nvd. No black, excessively dark, or bloody stools. No dysuria or any gu c/o. No change in meds. No fever or chills. No cough or uri symptoms. Indicates bps low in past 1-2 days, in 70s and 80s.   The history is provided by the patient, medical records and the spouse.       Home Medications Prior to Admission medications   Medication Sig Start Date End Date Taking? Authorizing Provider  amLODipine (NORVASC) 10 MG tablet Take 5 mg by mouth daily.    [provider]  benzonatate  (TESSALON ) 100 MG capsule Take 1 capsule (100 mg total) by mouth every 8 (eight) hours as needed for cough. 06/03/22   Dodson Freestone, FNP  dapagliflozin propanediol (FARXIGA) 10 MG TABS tablet Take by mouth daily.    [provider]  fluticasone  (FLONASE ) 50 MCG/ACT nasal spray Place 1 spray into both nostrils daily. 06/03/22   Dodson Freestone, FNP  metoprolol  (TOPROL -XL) 200 MG 24 hr tablet Take 200 mg by mouth daily. 11/30/16   [provider]  Multiple Vitamin (MULTIVITAMIN WITH MINERALS) TABS Take 1 tablet by mouth daily.    [provider]  olmesartan (BENICAR) 20 MG tablet Take 40 mg by mouth daily.    [provider]  omeprazole (PRILOSEC) 20 MG capsule Take 20 mg by mouth daily as needed (acid reflux). 12/30/16   [provider]  PARoxetine (PAXIL) 20 MG tablet Take 20 mg by mouth daily.    [provider]  rosuvastatin  (CRESTOR ) 10 MG tablet Take 10 mg by mouth daily.    [provider]  tamsulosin  (FLOMAX ) 0.4 MG CAPS capsule TAKE 1 CAPSULE BY MOUTH  DAILY Patient taking differently: Take 0.4 mg by mouth daily. 12/10/19   Wrenn, John, MD      Allergies    Patient has no known allergies.    Review of Systems   Review of Systems  Constitutional:  Negative for fever.  HENT:  Negative for sore throat.   Eyes:  Negative for pain and visual disturbance.  Respiratory:  Negative for cough and shortness of breath.   Cardiovascular:  Negative for chest pain, palpitations and leg swelling.  Gastrointestinal:  Negative for abdominal pain, blood in stool, diarrhea and vomiting.  Genitourinary:  Negative for dysuria and flank pain.  Musculoskeletal:  Negative for back pain and neck pain.  Skin:  Negative for rash.  Neurological:  Negative for speech difficulty, weakness, numbness and headaches.    Physical Exam Updated Vital Signs BP 114/72   Pulse 66   Temp (!) 97.5 F (36.4 C) (Oral)   Resp 15   Ht 1.727 m (5\' 8" )   Wt 92.1 kg   SpO2 97%   BMI 30.87 kg/m  Physical Exam Vitals and nursing note reviewed.  Constitutional:      Appearance: Normal appearance. He is well-developed.  HENT:     Head:     Comments: Small contusion lateral to right eye - no infection to area.     Nose: Nose normal.     Mouth/Throat:     Mouth: Mucous membranes are moist.     Pharynx: Oropharynx is clear.  Eyes:     General: No scleral icterus.    Conjunctiva/sclera: Conjunctivae normal.     Pupils: Pupils are equal, round, and reactive to light.  Neck:     Vascular: No carotid bruit.     Trachea: No tracheal deviation.     Comments: Trachea midline. Thyroid  not grossly enlarged or tender.  Cardiovascular:     Rate and Rhythm: Normal rate and regular rhythm.     Pulses: Normal pulses.     Heart sounds: Normal heart sounds. No murmur heard.    No friction rub. No gallop.   Pulmonary:     Effort: Pulmonary effort is normal. No accessory muscle usage or respiratory distress.     Breath sounds: Normal breath sounds.  Abdominal:     General: Bowel sounds are normal. There is no distension.     Palpations: Abdomen is soft. There is no mass.     Tenderness: There is no abdominal tenderness. There is no guarding.  Genitourinary:    Comments: No cva tenderness. Musculoskeletal:        General: No swelling or tenderness.     Cervical back: Normal range of motion and neck supple. No rigidity or tenderness.     Right lower leg: No edema.     Left lower leg: No edema.     Comments: CTLS spine, non tender, aligned, no step off. Good rom bil ext without pain or focal bony tenderness. No focal swelling. Intact distal pulses bil;.   Lymphadenopathy:     Cervical: No cervical adenopathy.  Skin:    General: Skin is warm and dry.     Findings: No rash.  Neurological:     Mental Status: He is alert.     Comments: Alert, speech clear. Motor/sens grossly intact bil.   Psychiatric:        Mood and Affect: Mood normal.     ED Results / Procedures / Treatments   Labs (all labs ordered are listed, but only abnormal results are displayed) Results for orders placed or performed during the hospital encounter of 08/03/23  CBC with Differential   Collection Time: 08/03/23  2:21 PM  Result Value Ref Range   WBC 8.5 4.0 - 10.5 K/uL   RBC 4.59 4.22 - 5.81 MIL/uL   Hemoglobin 13.9 13.0 - 17.0 g/dL   HCT 78.2 95.6 - 21.3 %   MCV 93.2 80.0 - 100.0 fL   MCH 30.3 26.0 - 34.0 pg   MCHC 32.5 30.0 - 36.0 g/dL   RDW 08.6 57.8 - 46.9 %   Platelets 192 150 - 400 K/uL   nRBC 0.0 0.0 - 0.2 %   Neutrophils Relative % 80 %   Neutro Abs 6.8 1.7 - 7.7 K/uL   Lymphocytes Relative 9 %   Lymphs Abs 0.8 0.7 - 4.0 K/uL   Monocytes Relative 10 %   Monocytes Absolute 0.8 0.1 - 1.0 K/uL   Eosinophils Relative 1 %   Eosinophils Absolute 0.1 0.0 - 0.5 K/uL   Basophils Relative 0 %    Basophils Absolute 0.0 0.0 - 0.1 K/uL   Immature Granulocytes 0 %   Abs Immature Granulocytes 0.03 0.00 - 0.07  K/uL  Comprehensive metabolic panel   Collection Time: 08/03/23  2:21 PM  Result Value Ref Range   Sodium 132 (L) 135 - 145 mmol/L   Potassium 4.5 3.5 - 5.1 mmol/L   Chloride 102 98 - 111 mmol/L   CO2 20 (L) 22 - 32 mmol/L   Glucose, Bld 117 (H) 70 - 99 mg/dL   BUN 54 (H) 8 - 23 mg/dL   Creatinine, Ser 6.57 (H) 0.61 - 1.24 mg/dL   Calcium  8.7 (L) 8.9 - 10.3 mg/dL   Total Protein 6.6 6.5 - 8.1 g/dL   Albumin 3.7 3.5 - 5.0 g/dL   AST 24 15 - 41 U/L   ALT 19 0 - 44 U/L   Alkaline Phosphatase 57 38 - 126 U/L   Total Bilirubin 0.3 0.0 - 1.2 mg/dL   GFR, Estimated 18 (L) >60 mL/min   Anion gap 10 5 - 15  Troponin I (High Sensitivity)   Collection Time: 08/03/23  4:14 PM  Result Value Ref Range   Troponin I (High Sensitivity) 2 <18 ng/L   DG Chest Port 1 View Result Date: 08/03/2023 CLINICAL DATA:  Hypotension and weakness. EXAM: PORTABLE CHEST 1 VIEW COMPARISON:  Chest x-ray 07/18/2023. FINDINGS: The heart size and mediastinal contours are within normal limits. Both lungs are clear. No pleural effusion or pneumothorax. Focal air pleural thickening is seen in the lower left hemithorax near the costophrenic angle measuring up to 15 mm, indeterminate. Left shoulder arthroplasty present. Cervical spinal fusion plate is present. IMPRESSION: 1. No active disease. 2. Focal air pleural thickening in the lower left hemithorax near the costophrenic angle measuring up to 15 mm, indeterminate. Recommend further evaluation with chest CT. Electronically Signed   By: Tyron Gallon M.D.   On: 08/03/2023 16:43   MR SHOULDER RIGHT WO CONTRAST Result Date: 07/19/2023 CLINICAL DATA:  Chronic and worsening right shoulder pain and limited range of motion. No known injury. EXAM: MRI OF THE RIGHT SHOULDER WITHOUT CONTRAST TECHNIQUE: Multiplanar, multisequence MR imaging of the shoulder was performed. No  intravenous contrast was administered. COMPARISON:  Plain films right shoulder 06/08/2023. FINDINGS: Rotator cuff: The patient has rotator cuff tendinopathy which is most severe in the supraspinatus. A partial width tear of the anterior and far lateral supraspinatus measures 1.5 cm from front to back. The tear is near full-thickness with only a thin intact component of tendon identified. Muscles: No atrophy or focal lesion. Biceps long head: Intact. Tendinopathy of the intra and extra-articular segments is worst in the intra-articular segment. Acromioclavicular Joint: Bulky osteoarthritis is present. Type 1 acromion. A small volume of fluid is seen in the subacromial/subdeltoid bursa. Glenohumeral Joint: The patient has advanced glenohumeral osteoarthritis with bone-on-bone joint space narrowing and a large osteophyte off the humeral head. Mild subchondral edema is present about the joint. No notable subchondral cyst formation. Labrum:  The superior labrum is degenerated and torn. Bones:  No fracture or worrisome lesion. Other: None. IMPRESSION: 1. Rotator cuff and long head of biceps tendinopathy with a 1.5 cm from front to back near full-thickness tear of the anterior and far lateral supraspinatus. There is little tendon retraction and no muscle atrophy. 2. Advanced glenohumeral and acromioclavicular osteoarthritis. 3. Small volume of subacromial/subdeltoid fluid compatible with bursitis. Electronically Signed   By: Etheleen Her M.D.   On: 07/19/2023 09:01   DG Chest 2 View Result Date: 07/19/2023 CLINICAL DATA:  SOB chronic cough EXAM: CHEST - 2 VIEW COMPARISON:  04/05/2022. FINDINGS: The heart size  and mediastinal contours are within normal limits. Lungs are hyperinflated suggesting COPD. There is no focal consolidation. No pneumothorax or pleural effusion. Left shoulder prosthesis. Thoracic degenerative changes. IMPRESSION: Findings suggest COPD.  Otherwise no acute cardiopulmonary disease. Electronically  Signed   By: Sydell Eva M.D.   On: 07/19/2023 03:49     EKG EKG Interpretation Date/Time:  Thursday August 03 2023 14:38:49 EDT Ventricular Rate:  70 PR Interval:  161 QRS Duration:  94 QT Interval:  398 QTC Calculation: 430 R Axis:   14  Text Interpretation: Sinus rhythm Confirmed by Guadalupe Lee ([id number]) on 08/03/2023 3:37:18 PM  Radiology DG Chest Port 1 View Result Date: 08/03/2023 CLINICAL DATA:  Hypotension and weakness. EXAM: PORTABLE CHEST 1 VIEW COMPARISON:  Chest x-ray 07/18/2023. FINDINGS: The heart size and mediastinal contours are within normal limits. Both lungs are clear. No pleural effusion or pneumothorax. Focal air pleural thickening is seen in the lower left hemithorax near the costophrenic angle measuring up to 15 mm, indeterminate. Left shoulder arthroplasty present. Cervical spinal fusion plate is present. IMPRESSION: 1. No active disease. 2. Focal air pleural thickening in the lower left hemithorax near the costophrenic angle measuring up to 15 mm, indeterminate. Recommend further evaluation with chest CT. Electronically Signed   By: Tyron Gallon M.D.   On: 08/03/2023 16:43    Procedures Procedures    Medications Ordered in ED Medications  sodium chloride  0.9 % bolus 500 mL (0 mLs Intravenous Stopped 08/03/23 1512)  lactated ringers  bolus 1,000 mL (1,000 mLs Intravenous New Bag/Given 08/03/23 1632)    ED Course/ Medical Decision Making/ A&P                                 Medical Decision Making Problems Addressed: Abnormal chest x-ray: acute illness or injury AKI (acute kidney injury) [facility]): acute illness or injury with systemic symptoms that poses a threat to life or bodily functions Dehydration: acute illness or injury with systemic symptoms that poses a threat to life or bodily functions Hypotension due to hypovolemia: acute illness or injury with systemic symptoms that poses a threat to life or bodily functions Near syncope: acute illness or  injury with systemic symptoms that poses a threat to life or bodily functions  Amount and/or Complexity of Data Reviewed Independent Historian: spouse    Details: hx External Data Reviewed: notes. Labs: ordered. Decision-making details documented in ED Course. Radiology: ordered and independent interpretation performed. Decision-making details documented in ED Course. Discussion of management or test interpretation with external provider(s): medicine  Risk Decision regarding hospitalization.  Iv ns. Continuous pulse ox and cardiac monitoring. Labs ordered/sent. Imaging ordered.   Differential diagnosis includes dehydration, aki, uti, anemia, etc. Dispo decision including potential need for admission considered - will get labs and imaging and reassess.   Reviewed nursing notes and prior charts for additional history. External reports reviewed. Additional history from: spouse.   LR bolus.   Cardiac monitor: sinus rhythm, rate 68.  Labs reviewed/interpreted by me - wbc and hgb normal. Hco3 sl low, and elevated bun/cr - ?dehydration. Additional ivf.  Trop normal. Ua pending.   Xrays reviewed/interpreted by me - no pna. Radiology notes left lower pleural thickening and recommends f/u CT - as BUN/CR improved w hydration, patient may benefit from CT/CTA chest during admission.   Medicine consulted for admission.   Recheck, no new c/o, no cp, no sob. No abd pain. Feels improved  from earlier.   CRITICAL CARE RE: hypotension/hypovolemia, dehydration, aki,  Performed by: Bueford Arp E Jabari Swoveland Total critical care time: 45 minutes Critical care time was exclusive of separately billable procedures and treating other patients. Critical care was necessary to treat or prevent imminent or life-threatening deterioration. Critical care was time spent personally by me on the following activities: development of treatment plan with patient and/or surrogate as well as nursing, discussions with consultants,  evaluation of patient's response to treatment, examination of patient, obtaining history from patient or surrogate, ordering and performing treatments and interventions, ordering and review of laboratory studies, ordering and review of radiographic studies, pulse oximetry and re-evaluation of patient's condition.          Final Clinical Impression(s) / ED Diagnoses Final diagnoses:  Hypotension due to hypovolemia  Dehydration  Near syncope  AKI (acute kidney injury) (HCC)  Abnormal chest x-ray    Rx / DC Orders ED Discharge Orders     None         Guadalupe Lee, MD 08/03/23 1659

## 2023-08-03 NOTE — Assessment & Plan Note (Addendum)
 Patient presents with almost 3 days of presyncope and syncope like symptoms.  Found to have documented hypotension in the ER as well as per patient report at home last 3 days.  No chest pain shortness of breath.  Patient blood pressure has improved with IV fluids.  Patient is chronically hypertensive and is on antihypertensive regimen.  I think antihypertensive overdose is unlikely over the 3 days that the patient has had hypotension/presyncope at home.  Therefore will look for underlying causes.  Possible dehydration, although patient denies any vomiting or diarrhea - he is on lasix - hold same.  Troponin is negative, I will proceed and check an echo, will rule out venous thromboembolism with lower extremity Dopplers.  I will check TSH and cortisol and a blood culture. Hold anti HTN regimen.

## 2023-08-04 ENCOUNTER — Observation Stay (HOSPITAL_COMMUNITY)

## 2023-08-04 DIAGNOSIS — R609 Edema, unspecified: Secondary | ICD-10-CM | POA: Diagnosis not present

## 2023-08-04 DIAGNOSIS — Z7985 Long-term (current) use of injectable non-insulin antidiabetic drugs: Secondary | ICD-10-CM | POA: Diagnosis not present

## 2023-08-04 DIAGNOSIS — E861 Hypovolemia: Secondary | ICD-10-CM | POA: Diagnosis present

## 2023-08-04 DIAGNOSIS — M1712 Unilateral primary osteoarthritis, left knee: Secondary | ICD-10-CM | POA: Diagnosis present

## 2023-08-04 DIAGNOSIS — Z79899 Other long term (current) drug therapy: Secondary | ICD-10-CM | POA: Diagnosis not present

## 2023-08-04 DIAGNOSIS — R55 Syncope and collapse: Secondary | ICD-10-CM

## 2023-08-04 DIAGNOSIS — Z6830 Body mass index (BMI) 30.0-30.9, adult: Secondary | ICD-10-CM | POA: Diagnosis not present

## 2023-08-04 DIAGNOSIS — E871 Hypo-osmolality and hyponatremia: Secondary | ICD-10-CM | POA: Diagnosis present

## 2023-08-04 DIAGNOSIS — I9589 Other hypotension: Secondary | ICD-10-CM | POA: Diagnosis present

## 2023-08-04 DIAGNOSIS — K219 Gastro-esophageal reflux disease without esophagitis: Secondary | ICD-10-CM | POA: Diagnosis present

## 2023-08-04 DIAGNOSIS — M1612 Unilateral primary osteoarthritis, left hip: Secondary | ICD-10-CM | POA: Diagnosis present

## 2023-08-04 DIAGNOSIS — M7989 Other specified soft tissue disorders: Secondary | ICD-10-CM | POA: Diagnosis present

## 2023-08-04 DIAGNOSIS — N401 Enlarged prostate with lower urinary tract symptoms: Secondary | ICD-10-CM | POA: Diagnosis present

## 2023-08-04 DIAGNOSIS — R918 Other nonspecific abnormal finding of lung field: Secondary | ICD-10-CM | POA: Diagnosis present

## 2023-08-04 DIAGNOSIS — Z87891 Personal history of nicotine dependence: Secondary | ICD-10-CM | POA: Diagnosis not present

## 2023-08-04 DIAGNOSIS — E785 Hyperlipidemia, unspecified: Secondary | ICD-10-CM | POA: Diagnosis present

## 2023-08-04 DIAGNOSIS — Z96641 Presence of right artificial hip joint: Secondary | ICD-10-CM | POA: Diagnosis present

## 2023-08-04 DIAGNOSIS — Z96612 Presence of left artificial shoulder joint: Secondary | ICD-10-CM | POA: Diagnosis present

## 2023-08-04 DIAGNOSIS — E860 Dehydration: Secondary | ICD-10-CM | POA: Diagnosis present

## 2023-08-04 DIAGNOSIS — Z96651 Presence of right artificial knee joint: Secondary | ICD-10-CM | POA: Diagnosis present

## 2023-08-04 DIAGNOSIS — E66811 Obesity, class 1: Secondary | ICD-10-CM | POA: Diagnosis present

## 2023-08-04 DIAGNOSIS — Z8711 Personal history of peptic ulcer disease: Secondary | ICD-10-CM | POA: Diagnosis not present

## 2023-08-04 DIAGNOSIS — I10 Essential (primary) hypertension: Secondary | ICD-10-CM | POA: Diagnosis present

## 2023-08-04 DIAGNOSIS — N179 Acute kidney failure, unspecified: Secondary | ICD-10-CM | POA: Diagnosis present

## 2023-08-04 LAB — ECHOCARDIOGRAM COMPLETE
AR max vel: 2.7 cm2
AV Peak grad: 9.7 mmHg
Ao pk vel: 1.56 m/s
Area-P 1/2: 2.75 cm2
Height: 68 in
MV VTI: 3.13 cm2
S' Lateral: 3.2 cm
Weight: 3248 [oz_av]

## 2023-08-04 LAB — CBC
HCT: 41.6 % (ref 39.0–52.0)
Hemoglobin: 13 g/dL (ref 13.0–17.0)
MCH: 30.4 pg (ref 26.0–34.0)
MCHC: 31.3 g/dL (ref 30.0–36.0)
MCV: 97.4 fL (ref 80.0–100.0)
Platelets: 155 10*3/uL (ref 150–400)
RBC: 4.27 MIL/uL (ref 4.22–5.81)
RDW: 14.1 % (ref 11.5–15.5)
WBC: 5.2 10*3/uL (ref 4.0–10.5)
nRBC: 0 % (ref 0.0–0.2)

## 2023-08-04 LAB — PROTIME-INR
INR: 1.6 — ABNORMAL HIGH (ref 0.8–1.2)
Prothrombin Time: 19.4 s — ABNORMAL HIGH (ref 11.4–15.2)

## 2023-08-04 LAB — BASIC METABOLIC PANEL WITH GFR
Anion gap: 8 (ref 5–15)
BUN: 39 mg/dL — ABNORMAL HIGH (ref 8–23)
CO2: 24 mmol/L (ref 22–32)
Calcium: 8.6 mg/dL — ABNORMAL LOW (ref 8.9–10.3)
Chloride: 104 mmol/L (ref 98–111)
Creatinine, Ser: 2.11 mg/dL — ABNORMAL HIGH (ref 0.61–1.24)
GFR, Estimated: 32 mL/min — ABNORMAL LOW (ref >60)
Glucose, Bld: 98 mg/dL (ref 70–99)
Potassium: 4.3 mmol/L (ref 3.5–5.1)
Sodium: 136 mmol/L (ref 135–145)

## 2023-08-04 LAB — APTT: aPTT: 24 s (ref 24–36)

## 2023-08-04 LAB — CORTISOL-AM, BLOOD: Cortisol - AM: 5.4 ug/dL — ABNORMAL LOW (ref 6.7–22.6)

## 2023-08-04 LAB — TSH: TSH: 0.271 u[IU]/mL — ABNORMAL LOW (ref 0.350–4.500)

## 2023-08-04 LAB — OSMOLALITY: Osmolality: 300 mosm/kg — ABNORMAL HIGH (ref 275–295)

## 2023-08-04 LAB — T4, FREE: Free T4: 1 ng/dL (ref 0.61–1.12)

## 2023-08-04 MED ORDER — LACTATED RINGERS IV SOLN: INTRAVENOUS | Status: DC

## 2023-08-04 MED ORDER — PERFLUTREN LIPID MICROSPHERE
1.0000 mL | INTRAVENOUS | Status: AC | PRN
  Administered 2023-08-04: 2 mL via INTRAVENOUS

## 2023-08-04 MED ORDER — GUAIFENESIN-DM 100-10 MG/5ML PO SYRP
5.0000 mL | ORAL_SOLUTION | ORAL | Status: DC | PRN
  Administered 2023-08-04: 5 mL via ORAL
  Filled 2023-08-04: qty 5

## 2023-08-04 NOTE — Hospital Course (Addendum)
 Brief Narrative/Hospital Course: 73 y.o. male with medical history significant of hypertension  who started feeling "lightheaded "further clarified as presyncope like symptoms.  Whenever patient would stand up from laying down position or exert himself.  Symptoms going on for 3 days  Patient describes actually having at least 1 episode of loss of consciousness while getting out of heart pool about 48 hours PTA.Which was self-limited and patient was able to get back up and leave.  Patient finally came to the ER because of persistent symptoms  On arrival, patient's systolic blood pressure was in the 80s.  Patient has received fluid boluses and blood pressure has come up to 114/72. Chest x-ray focal area of pleural thickening, no active disease, labs with significant AKI 3.4 stable CBC UA unremarkable Blood culture sent.  Patient admitted With aggressive hydration AKI has resolved, hypotension resolved BP remains stable and antihypertensive on hold Blood culture no growth so far.  At this time is stable for discharge, advised outpatient PCP follow-up before resuming his antihypertensive Underwent cosyntropin  test for completeness and stable Patient was discharged home  Subjective: Seen and examined resting comfortably eager to go home Alert awake oriented afebrile overnight BP in 130s  No new complaints   Discharge diagnosis : Hypotension Presyncope/syncope like symptoms History of hypertension: Patient is symptomatic hypotension x 3 days.Suspect due to antihypertensive and possible dehydration possible dehydration, although patient denies any vomiting or diarrhea but on lasix BP stable now. Cont holding antihypertensive, diuretics. Continue oral hydration Holding Toprol  Benicar amlodipine, Aldactone, metoprolol . Blood culture NGTD-no evidence of infection. Troponin is negative, echocardiogram-with EF 60-65%, G1 DD RV SF normal RV size normal mitral valve normal aortic valve normal Duplex  negative for DVT.  TSH suppressed but FT4 Normal-will need outpatient TSH follow-up in 3 weeks Cortisol low in 5-Underwent cosyntropin  test for completeness-if unremarkable patient will be discharged home   Pleural thickening: Incidental finding. D-dimer negative.   AKI: B/l creat 1.31> worsened to 3.44.suspect due to hypotension-resolved with IV fluids. 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

## 2023-08-04 NOTE — Progress Notes (Signed)
 BLE venous duplex has been completed.   Results can be found under chart review under CV PROC. 08/04/2023 10:48 AM Grizel Vesely RVT, RDMS

## 2023-08-04 NOTE — Progress Notes (Signed)
 PROGRESS NOTE Raymond Graham  YNW:[id number] DOB: 05/13/1950 DOA: 08/03/2023 PCP: Ulysees Gander, MD  Brief Narrative/Hospital Course: 73 y.o. male with medical history significant of hypertension  who started feeling "lightheaded "further clarified as presyncope like symptoms.  Whenever patient would stand up from laying down position or exert himself.  Symptoms going on for 3 days  Patient describes actually having at least 1 episode of loss of consciousness while getting out of heart pool about 48 hours PTA.Which was self-limited and patient was able to get back up and leave.  Patient finally came to the ER because of persistent symptoms  On arrival, patient's systolic blood pressure was in the 80s.  Patient has received fluid boluses and blood pressure has come up to 114/72. Chest x-ray focal area of pleural thickening, no active disease, labs with significant AKI 3.4 stable CBC UA unremarkable Blood culture sent.  Patient admitted  Subjective: Seen and examined Feels much better and fine today Overnight events afebrile BP 98-1 20s, on room air Labs reviewed creatinine downtrending to 2.1, cbc normal, tsh 0.2-FT4 ordered.  Assessment and plan:  Hypotension Presyncope/syncope like symptoms History of hypertension: Patient is symptomatic hypotension x 3 days.  Holding antihypertensive, diuretics, BP has stabilized, creatinine downtrending.  Continue oral hydration.  Suspect due to antihypertensive and possible dehydration possible dehydration, although patient denies any vomiting or diarrhea but on lasix.  Holding Toprol  Benicar amlodipine, Aldactone, metoprolol .  Blood culture NGTD Troponin is negative, echocardiogram, duplex, am cortisol pending.  TSH suppressed will order free T4   Pleural thickening: Incidental finding. D-dimer negative.   AKI: B/l creat 1.31> worsened to 3.44.suspect due to hypotension.  Improving with IV fluids. Monitor  UOP, trend labs.  Continue IV fluid  hydration continue holding antihypertensive, encouraged oral intake Recent Labs    08/03/23 1421 08/03/23 1839 08/04/23 0447  BUN 54*  --  39*  CREATININE 3.44* 2.82* 2.11*  CO2 20*  --  24  K 4.5  --  4.3     Hyponatremia: Improving on IV fluids  HLD: Continue Crestor   Class I Obesity w/ Body mass index is 30.87 kg/m.: Will benefit with PCP follow-up, weight loss,healthy lifestyle and outpatient sleep eval if not done.  DVT prophylaxis: heparin  injection 5,000 Units Start: 08/04/23 0600 SCDs Start: 08/03/23 1710 Code Status:   Code Status: Full Code Family Communication: plan of care discussed with patient at bedside. Patient status is: Remains hospitalized because of severity of illness Level of care: Telemetry   Dispo: The patient is from: home            Anticipated disposition: TBD Objective: Vitals last 24 hrs: Vitals:   08/04/23 0230 08/04/23 0234 08/04/23 0459 08/04/23 1212  BP: 98/66 (!) 98/58 120/81 116/74  Pulse: 86  84 81  Resp: 18  18   Temp: 98.2 F (36.8 C)  98.3 F (36.8 C) 97.9 F (36.6 C)  TempSrc:      SpO2: 92%  97% 96%  Weight:      Height:       Physical Examination: General exam: alert awake, older than stated age HEENT:Oral mucosa moist, Ear/Nose WNL grossly Respiratory system: Bilaterally diminished BS, no use of accessory muscle Cardiovascular system: S1 & S2 +. Gastrointestinal system: Abdomen soft, NT,ND,BS+ Nervous System: Alert, awake,following commands. Extremities: LE edema neg, moving arms legs, warm legs Skin: No rashes,warm. MSK: Normal muscle bulk/tone.   Data Reviewed: I have personally reviewed following labs and imaging studies (  see epic result tab) CBC: Recent Labs  Lab 08/03/23 1421 08/04/23 0447  WBC 8.5 5.2  NEUTROABS 6.8  --   HGB 13.9 13.0  HCT 42.8 41.6  MCV 93.2 97.4  PLT 192 155  CMP: Recent Labs  Lab 08/03/23 1421 08/03/23 1839 08/04/23 0447  NA 132*  --  136  K 4.5  --  4.3  CL 102  --  104   CO2 20*  --  24  GLUCOSE 117*  --  98  BUN 54*  --  39*  CREATININE 3.44* 2.82* 2.11*  CALCIUM  8.7*  --  8.6*  MG  --  2.6*  --    GFR: Estimated Creatinine Clearance: 33.8 mL/min (A) (by C-G formula based on SCr of 2.11 mg/dL (H)). Recent Labs  Lab 08/03/23 1421  AST 24  ALT 19  ALKPHOS 57  BILITOT 0.3  PROT 6.6  ALBUMIN 3.7   No results for input(s): "LIPASE", "AMYLASE" in the last 168 hours. No results for input(s): "AMMONIA" in the last 168 hours. Coagulation Profile:  Recent Labs  Lab 08/04/23 0447  INR 1.6*  Medications reviewed:  Scheduled Meds:  gabapentin   100 mg Oral TID   heparin   5,000 Units Subcutaneous Q8H   pantoprazole   40 mg Oral Daily   rosuvastatin   10 mg Oral QHS   sodium chloride  flush  3 mL Intravenous Q12H   tamsulosin   0.4 mg Oral Daily   Continuous Infusions:  lactated ringers  100 mL/hr at 08/04/23 2841    Total time spent in review of labs and imaging, patient evaluation, formulation of plan, documentation and communication with patient/family: 50 minutes  Lesa Rape, MD [facility] 08/04/2023, 12:51 PM

## 2023-08-04 NOTE — Plan of Care (Signed)
  Problem: Education: Goal: Knowledge of General Education information will improve Description: Including pain rating scale, medication(s)/side effects and non-pharmacologic comfort measures Outcome: Progressing   Problem: Health Behavior/Discharge Planning: Goal: Ability to manage health-related needs will improve Outcome: Progressing   Problem: Clinical Measurements: Goal: Cardiovascular complication will be avoided Outcome: Progressing   Problem: Safety: Goal: Ability to remain free from injury will improve Outcome: Progressing   

## 2023-08-04 NOTE — Progress Notes (Signed)
   08/04/23 1411  TOC Brief Assessment  Insurance and Status Reviewed  Patient has primary care physician Yes  Home environment has been reviewed single family home  Prior level of function: independent  Prior/Current Home Services No current home services  Social Drivers of Health Review SDOH reviewed no interventions necessary  Readmission risk has been reviewed Yes  Transition of care needs no transition of care needs at this time    [place], LCSW 08/04/2023 2:12 PM

## 2023-08-04 NOTE — Progress Notes (Signed)
 Pt has the following BP: 98/66 (automatic) and rechecked manually 98/58. Pt denies any symptoms. Lorre Rosin, NP made aware thru secure chat.

## 2023-08-04 NOTE — Progress Notes (Signed)
 Pt's creatinine=2.11. Pt has continuous IV with LR at 100ml/hr to be discontinued at 0700. Asked Lorre Rosin, NP if she wants to continue IV. LR 100ml/hr ordered for 6 hours and dayshift team will follow up afterwards.

## 2023-08-04 NOTE — Progress Notes (Signed)
 Mobility Specialist - Progress Note   08/04/23 1306  Mobility  Activity Ambulated independently in hallway  Level of Assistance Independent  Assistive Device None  Distance Ambulated (ft) 600 ft  Activity Response Tolerated well  Mobility Referral Yes  Mobility visit 1 Mobility  Mobility Specialist Start Time (ACUTE ONLY) 1254  Mobility Specialist Stop Time (ACUTE ONLY) 1305  Mobility Specialist Time Calculation (min) (ACUTE ONLY) 11 min   Pt received in bed and agreeable to mobility. No complaints during session. Pt to bed after session with all needs met.    [facility]

## 2023-08-04 NOTE — Plan of Care (Signed)

## 2023-08-04 NOTE — Care Management Obs Status (Signed)
 MEDICARE OBSERVATION STATUS NOTIFICATION   Patient Details  Name: Raymond Graham MRN: [id number] Date of Birth: 11-19-1950   Medicare Observation Status Notification Given:  Yes    Tessie Fila, RN 08/04/2023, 2:02 PM

## 2023-08-05 DIAGNOSIS — E861 Hypovolemia: Secondary | ICD-10-CM | POA: Diagnosis not present

## 2023-08-05 LAB — CBC
HCT: 40.6 % (ref 39.0–52.0)
Hemoglobin: 12.8 g/dL — ABNORMAL LOW (ref 13.0–17.0)
MCH: 29.9 pg (ref 26.0–34.0)
MCHC: 31.5 g/dL (ref 30.0–36.0)
MCV: 94.9 fL (ref 80.0–100.0)
Platelets: 164 10*3/uL (ref 150–400)
RBC: 4.28 MIL/uL (ref 4.22–5.81)
RDW: 13.8 % (ref 11.5–15.5)
WBC: 4.9 10*3/uL (ref 4.0–10.5)
nRBC: 0 % (ref 0.0–0.2)

## 2023-08-05 LAB — ACTH STIMULATION, 3 TIME POINTS
Cortisol, 30 Min: 32.3 ug/dL
Cortisol, 60 Min: 37.3 ug/dL
Cortisol, Base: 17 ug/dL

## 2023-08-05 LAB — BASIC METABOLIC PANEL WITH GFR
Anion gap: 7 (ref 5–15)
BUN: 22 mg/dL (ref 8–23)
CO2: 27 mmol/L (ref 22–32)
Calcium: 9 mg/dL (ref 8.9–10.3)
Chloride: 104 mmol/L (ref 98–111)
Creatinine, Ser: 1.27 mg/dL — ABNORMAL HIGH (ref 0.61–1.24)
GFR, Estimated: 59 mL/min — ABNORMAL LOW (ref >60)
Glucose, Bld: 99 mg/dL (ref 70–99)
Potassium: 4.3 mmol/L (ref 3.5–5.1)
Sodium: 138 mmol/L (ref 135–145)

## 2023-08-05 MED ORDER — COSYNTROPIN 0.25 MG IJ SOLR
0.2500 mg | Freq: Once | INTRAMUSCULAR | Status: AC
  Administered 2023-08-05: 0.25 mg via INTRAVENOUS
  Filled 2023-08-05: qty 0.25

## 2023-08-05 NOTE — Plan of Care (Signed)

## 2023-08-05 NOTE — Discharge Summary (Signed)
 Physician Discharge Summary  Raymond Graham IHK:[id number] DOB: June 23, 1950 DOA: 08/03/2023  PCP: Ulysees Gander, MD  Admit date: 08/03/2023 Discharge date: 08/05/2023 Recommendations for Outpatient Follow-up:  Follow up with PCP in 1 weeks-call for appointment-continue to hold antihypertensives until then Please obtain BMP/CBC in one week  Discharge Dispo: Home Discharge Condition: Stable Code Status:   Code Status: Full Code Diet recommendation:  Diet Order             Diet regular Room service appropriate? Yes; Fluid consistency: Thin  Diet effective now                   Brief Narrative/Hospital Course: 73 y.o. male with medical history significant of hypertension  who started feeling "lightheaded "further clarified as presyncope like symptoms.  Whenever patient would stand up from laying down position or exert himself.  Symptoms going on for 3 days  Patient describes actually having at least 1 episode of loss of consciousness while getting out of heart pool about 48 hours PTA.Which was self-limited and patient was able to get back up and leave.  Patient finally came to the ER because of persistent symptoms  On arrival, patient's systolic blood pressure was in the 80s.  Patient has received fluid boluses and blood pressure has come up to 114/72. Chest x-ray focal area of pleural thickening, no active disease, labs with significant AKI 3.4 stable CBC UA unremarkable Blood culture sent.  Patient admitted With aggressive hydration AKI has resolved, hypotension resolved BP remains stable and antihypertensive on hold Blood culture no growth so far.  At this time is stable for discharge, advised outpatient PCP follow-up before resuming his antihypertensive Underwent cosyntropin test for completeness and stable Patient was discharged home  Subjective: Seen and examined resting comfortably eager to go home Alert awake oriented afebrile overnight BP in 130s  No new complaints    Discharge diagnosis : Hypotension Presyncope/syncope like symptoms History of hypertension: Patient is symptomatic hypotension x 3 days.Suspect due to antihypertensive and possible dehydration possible dehydration, although patient denies any vomiting or diarrhea but on lasix BP stable now. Cont holding antihypertensive, diuretics. Continue oral hydration Holding Toprol  Benicar amlodipine, Aldactone, metoprolol . Blood culture NGTD-no evidence of infection. Troponin is negative, echocardiogram-with EF 60-65%, G1 DD RV SF normal RV size normal mitral valve normal aortic valve normal Duplex negative for DVT.  TSH suppressed but FT4 Normal-will need outpatient TSH follow-up in 3 weeks Cortisol low in 5-Underwent cosyntropin test for completeness-if unremarkable patient will be discharged home   Pleural thickening: Incidental finding. D-dimer negative.   AKI: B/l creat 1.31> worsened to 3.44.suspect due to hypotension-resolved with IV fluids. 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   Discharge Exam: Vitals:   08/04/23 2014 08/05/23 0345  BP: 129/85 139/86  Pulse: 76 78  Resp: 18 18  Temp: (!) 97.4 F (36.3 C) 97.6 F (36.4 C)  SpO2: 98% 94%   General: Pt is alert, awake, not in acute distress Cardiovascular: RRR, S1/S2 +, no rubs, no gallops Respiratory: CTA bilaterally, no wheezing, no  rhonchi Abdominal: Soft, NT, ND, bowel sounds + Extremities: no edema, no cyanosis  Discharge Instructions  Discharge Instructions     Discharge instructions   Complete by: As directed    Monitor blood pressure daily at home, please do not take  your blood pressure medication until you discuss with your primary care doctor  Please call call MD or return to ER for similar or worsening recurring problem that brought you to hospital or if any fever,nausea/vomiting,abdominal pain, uncontrolled pain, chest pain,  shortness of breath or any other alarming symptoms.  Please follow-up your doctor as instructed in a week time and call the office for appointment.  Please avoid alcohol, smoking, or any other illicit substance and maintain healthy habits including taking your regular medications as prescribed.  You were cared for by a hospitalist during your hospital stay. If you have any questions about your discharge medications or the care you received while you were in the hospital after you are discharged, you can call the unit and ask to speak with the hospitalist on call if the hospitalist that took care of you is not available.  Once you are discharged, your primary care physician will handle any further medical issues. Please note that NO REFILLS for any discharge medications will be authorized once you are discharged, as it is imperative that you return to your primary care physician (or establish a relationship with a primary care physician if you do not have one) for your aftercare needs so that they can reassess your need for medications and monitor your lab values   Increase activity slowly   Complete by: As directed       Allergies as of 08/05/2023   No Known Allergies      Medication List     STOP taking these medications    amLODipine 10 MG tablet Commonly known as: NORVASC   furosemide 20 MG tablet Commonly known as: LASIX   metoprolol  200 MG 24 hr tablet Commonly known as: TOPROL -XL   olmesartan 40 MG tablet Commonly known as: BENICAR   spironolactone 25 MG tablet Commonly known as: ALDACTONE   tadalafil 5 MG tablet Commonly known as: CIALIS   tiZANidine  2 MG tablet Commonly known as: ZANAFLEX    traZODone   50 MG tablet Commonly known as: DESYREL        TAKE these medications    ACETAMINOPHEN -CODEINE PO Take 1 tablet by mouth every 4 (four) hours as needed for moderate pain (pain score 4-6).   albuterol  108 (90 Base) MCG/ACT inhaler Commonly known as: VENTOLIN  HFA Inhale 1-2 puffs into the lungs every 6 (six) hours as needed for wheezing or shortness of breath.   calcium  carbonate 500 MG chewable tablet Commonly known as: TUMS - dosed in mg elemental calcium  Chew 1-2 tablets by mouth as needed for indigestion or heartburn.   cetirizine 10 MG tablet Commonly known as: ZYRTEC Take 10 mg by mouth daily.   dapagliflozin propanediol 10 MG Tabs tablet Commonly known as: FARXIGA Take by mouth daily.   fluticasone  50 MCG/ACT nasal spray Commonly known as: FLONASE  Place 1 spray into both nostrils daily.   gabapentin  100 MG capsule Commonly known as: NEURONTIN  Take 100 mg by mouth 3 (three) times daily.   multivitamin with minerals Tabs tablet Take 1 tablet by mouth daily.   omeprazole 20 MG capsule Commonly known as: PRILOSEC Take 20 mg by mouth as needed (acid reflux).   Ozempic (0.25 or 0.5 MG/DOSE) 2 MG/3ML Sopn Generic drug: Semaglutide(0.25  or 0.5MG /DOS) Inject 0.5 mg into the skin once a week. On Saturdays   rosuvastatin  10 MG tablet Commonly known as: CRESTOR  Take 10 mg by mouth at bedtime.   sildenafil 20 MG tablet Commonly known as: REVATIO Take 2-5 tablets by mouth as needed.   tamsulosin  0.4 MG Caps capsule Commonly known as: FLOMAX  TAKE 1 CAPSULE BY MOUTH  DAILY        Follow-up Information     Ulysees Gander, MD Follow up in 1 week(s).   Specialty: Family Medicine Contact information: [address] [place] [address] [phone]                No Known Allergies  The results of significant diagnostics from this hospitalization (including imaging, microbiology, ancillary and laboratory) are listed below for reference.     Microbiology: Recent Results (from the past 240 hours)  Culture, blood (Routine X 2) w Reflex to ID Panel     Status: None (Preliminary result)   Collection Time: 08/03/23  6:39 PM   Specimen: BLOOD RIGHT HAND  Result Value Ref Range Status   Specimen Description   Final    BLOOD RIGHT HAND Performed at [facility], [address], [place], [place] [address]    Special Requests   Final    BOTTLES DRAWN AEROBIC ONLY Blood Culture results may not be optimal due to an inadequate volume of blood received in culture bottles Performed at [facility], [address] [place], [place] [address]    Culture   Final    NO GROWTH 2 DAYS Performed at [facility], [address], [place], [place] [address]    Report Status PENDING  Incomplete  Culture, blood (Routine X 2) w Reflex to ID Panel     Status: None (Preliminary result)   Collection Time: 08/03/23  7:09 PM   Specimen: BLOOD RIGHT HAND  Result Value Ref Range Status   Specimen Description   Final    BLOOD RIGHT HAND Performed at [facility], [address], [place], [place] [address]    Special Requests   Final    BOTTLES DRAWN AEROBIC ONLY Blood Culture results may not be optimal due to an inadequate volume of blood received in culture bottles Performed at [facility], [address] [place], [place] [address]    Culture   Final    NO GROWTH 2 DAYS Performed at [facility], [address] [place], [place] [address]    Report Status PENDING  Incomplete    Procedures/Studies: ECHOCARDIOGRAM COMPLETE Result Date: 08/04/2023    ECHOCARDIOGRAM REPORT   Patient Name:   Raymond Graham Date of Exam: 08/04/2023 Medical Rec #:  [id number]        Height:       68.0 in Accession #:    [id number]       Weight:       203.0 lb Date of Birth:  [birth year]        BSA:          2.057 m Patient Age:    72 years         BP:           116/74 mmHg Patient Gender: M                 HR:           75 bpm. Exam Location:  Inpatient Procedure: 2D Echo, Cardiac Doppler, Color Doppler and Intracardiac            Opacification Agent (Both Spectral and Color Flow Doppler were            utilized during procedure). Indications:    Syncope  History:        Patient has prior history of Echocardiogram examinations. Risk                 Factors:Hypertension and Former Smoker.  Sonographer:    Willey Harrier Referring Phys: [id number] [facility] GOEL IMPRESSIONS  1. Left ventricular ejection fraction, by estimation, is 60 to 65%. The left ventricle has normal function. The left ventricle has no regional wall motion abnormalities. Left ventricular diastolic parameters are consistent with Grade I diastolic dysfunction (impaired relaxation).  2. Right ventricular systolic function is normal. The right ventricular size is normal.  3. The mitral valve is normal in structure. Trivial mitral valve regurgitation. No evidence of mitral stenosis.  4. The aortic valve is normal in structure. Aortic valve regurgitation is not visualized. No aortic stenosis is present.  5. The inferior vena cava is normal in size with greater than 50% respiratory variability, suggesting right atrial pressure of 3 mmHg. FINDINGS  Left Ventricle: Left ventricular ejection fraction, by estimation, is 60 to 65%. The left ventricle has normal function. The left ventricle has no regional wall motion abnormalities. Definity  contrast agent was given IV to delineate the left ventricular  endocardial borders. The left ventricular internal cavity size was normal in size. There is no left ventricular hypertrophy. Left ventricular diastolic parameters are consistent with Grade I diastolic dysfunction (impaired relaxation). Right Ventricle: The right ventricular size is normal. No increase in right ventricular wall thickness. Right ventricular systolic function is normal. Left Atrium: Left atrial size was normal in size. Right Atrium: Right atrial size was  normal in size. Pericardium: There is no evidence of pericardial effusion. Mitral Valve: The mitral valve is normal in structure. Trivial mitral valve regurgitation. No evidence of mitral valve stenosis. MV peak gradient, 3.0 mmHg. The mean mitral valve gradient is 1.0 mmHg. Tricuspid Valve: The tricuspid valve is normal in structure. Tricuspid valve regurgitation is trivial. No evidence of tricuspid stenosis. Aortic Valve: The aortic valve is normal in structure. Aortic valve regurgitation is not visualized. No aortic stenosis is present. Aortic valve peak gradient measures 9.7 mmHg. Pulmonic Valve: The pulmonic valve was normal in structure. Pulmonic valve regurgitation is trivial. No evidence of pulmonic stenosis. Aorta: The aortic root is normal in size and structure. Venous: The inferior vena cava is normal in size with greater than 50% respiratory variability, suggesting right atrial pressure of 3 mmHg. IAS/Shunts: No atrial level shunt detected by color flow Doppler.  LEFT VENTRICLE PLAX 2D LVIDd:         5.20 cm   Diastology LVIDs:         3.20 cm   LV e' medial:    7.51 cm/s LV PW:         0.70 cm   LV E/e' medial:  8.8 LV IVS:        0.70 cm   LV e' lateral:   11.50 cm/s LVOT diam:     2.00 cm   LV E/e' lateral: 5.8 LV SV:         75 LV SV Index:   37 LVOT Area:     3.14 cm  RIGHT VENTRICLE  IVC RV Basal diam:  4.00 cm     IVC diam: 1.70 cm RV S prime:     12.60 cm/s TAPSE (M-mode): 2.1 cm LEFT ATRIUM             Index        RIGHT ATRIUM           Index LA Vol (A2C):   56.2 ml 27.33 ml/m  RA Area:     14.30 cm LA Vol (A4C):   39.1 ml 19.01 ml/m  RA Volume:   36.40 ml  17.70 ml/m LA Biplane Vol: 48.5 ml 23.58 ml/m  AORTIC VALVE AV Area (Vmax): 2.70 cm AV Vmax:        156.00 cm/s AV Peak Grad:   9.7 mmHg LVOT Vmax:      134.00 cm/s LVOT Vmean:     89.500 cm/s LVOT VTI:       0.240 m  AORTA Ao Root diam: 3.40 cm Ao Asc diam:  3.20 cm MITRAL VALVE MV Area (PHT): 2.75 cm    SHUNTS MV Area  VTI:   3.13 cm    Systemic VTI:  0.24 m MV Peak grad:  3.0 mmHg    Systemic Diam: 2.00 cm MV Mean grad:  1.0 mmHg MV Vmax:       0.87 m/s MV Vmean:      58.1 cm/s MV E velocity: 66.40 cm/s MV A velocity: 85.30 cm/s MV E/A ratio:  0.78 Jules Oar MD Electronically signed by Jules Oar MD Signature Date/Time: 08/04/2023/2:16:17 PM    Final    VAS US  LOWER EXTREMITY VENOUS (DVT) Result Date: 08/04/2023  Lower Venous DVT Study Patient Name:  Raymond Graham  Date of Exam:   08/04/2023 Medical Rec #: [id number]         Accession #:    [id number] Date of Birth: 1951-04-09         Patient Gender: M Patient Age:   52 years Exam Location:  [facility] Procedure:      VAS US  LOWER EXTREMITY VENOUS (DVT) Referring Phys: [facility] GOEL --------------------------------------------------------------------------------  Indications: Edema.  Comparison Study: No previous exams Performing Technologist: Jody Hill RVT, RDMS  Examination Guidelines: A complete evaluation includes B-mode imaging, spectral Doppler, color Doppler, and power Doppler as needed of all accessible portions of each vessel. Bilateral testing is considered an integral part of a complete examination. Limited examinations for reoccurring indications may be performed as noted. The reflux portion of the exam is performed with the patient in reverse Trendelenburg.  +---------+---------------+---------+-----------+----------+--------------+ RIGHT    CompressibilityPhasicitySpontaneityPropertiesThrombus Aging +---------+---------------+---------+-----------+----------+--------------+ CFV      Full           Yes      Yes                                 +---------+---------------+---------+-----------+----------+--------------+ SFJ      Full                                                        +---------+---------------+---------+-----------+----------+--------------+ FV Prox  Full           Yes      Yes                                  +---------+---------------+---------+-----------+----------+--------------+  FV Mid   Full           Yes      Yes                                 +---------+---------------+---------+-----------+----------+--------------+ FV DistalFull           Yes      Yes                                 +---------+---------------+---------+-----------+----------+--------------+ PFV      Full                                                        +---------+---------------+---------+-----------+----------+--------------+ POP      Full           Yes      Yes                                 +---------+---------------+---------+-----------+----------+--------------+ PTV      Full                                                        +---------+---------------+---------+-----------+----------+--------------+ PERO     Full                                                        +---------+---------------+---------+-----------+----------+--------------+   +---------+---------------+---------+-----------+----------+--------------+ LEFT     CompressibilityPhasicitySpontaneityPropertiesThrombus Aging +---------+---------------+---------+-----------+----------+--------------+ CFV      Full           Yes      Yes                                 +---------+---------------+---------+-----------+----------+--------------+ SFJ      Full                                                        +---------+---------------+---------+-----------+----------+--------------+ FV Prox  Full           Yes      Yes                                 +---------+---------------+---------+-----------+----------+--------------+ FV Mid   Full           Yes      Yes                                 +---------+---------------+---------+-----------+----------+--------------+ FV DistalFull  Yes      Yes                                  +---------+---------------+---------+-----------+----------+--------------+ PFV      Full                                                        +---------+---------------+---------+-----------+----------+--------------+ POP      Full           Yes      Yes                                 +---------+---------------+---------+-----------+----------+--------------+ PTV      Full                                                        +---------+---------------+---------+-----------+----------+--------------+ PERO     Full                                                        +---------+---------------+---------+-----------+----------+--------------+     Summary: BILATERAL: - No evidence of deep vein thrombosis seen in the lower extremities, bilaterally. -No evidence of popliteal cyst, bilaterally.   *See table(s) above for measurements and observations. Electronically signed by Jimmye Moulds MD on 08/04/2023 at 12:09:29 PM.    Final    DG Chest Port 1 View Result Date: 08/03/2023 CLINICAL DATA:  Hypotension and weakness. EXAM: PORTABLE CHEST 1 VIEW COMPARISON:  Chest x-ray 07/18/2023. FINDINGS: The heart size and mediastinal contours are within normal limits. Both lungs are clear. No pleural effusion or pneumothorax. Focal air pleural thickening is seen in the lower left hemithorax near the costophrenic angle measuring up to 15 mm, indeterminate. Left shoulder arthroplasty present. Cervical spinal fusion plate is present. IMPRESSION: 1. No active disease. 2. Focal air pleural thickening in the lower left hemithorax near the costophrenic angle measuring up to 15 mm, indeterminate. Recommend further evaluation with chest CT. Electronically Signed   By: Tyron Gallon M.D.   On: 08/03/2023 16:43   MR SHOULDER RIGHT WO CONTRAST Result Date: 07/19/2023 CLINICAL DATA:  Chronic and worsening right shoulder pain and limited range of motion. No known injury. EXAM: MRI OF THE RIGHT SHOULDER  WITHOUT CONTRAST TECHNIQUE: Multiplanar, multisequence MR imaging of the shoulder was performed. No intravenous contrast was administered. COMPARISON:  Plain films right shoulder 06/08/2023. FINDINGS: Rotator cuff: The patient has rotator cuff tendinopathy which is most severe in the supraspinatus. A partial width tear of the anterior and far lateral supraspinatus measures 1.5 cm from front to back. The tear is near full-thickness with only a thin intact component of tendon identified. Muscles: No atrophy or focal lesion. Biceps long head: Intact. Tendinopathy of the intra and extra-articular segments is worst in the intra-articular segment. Acromioclavicular Joint: Bulky osteoarthritis is present. Type 1 acromion. A small volume of  fluid is seen in the subacromial/subdeltoid bursa. Glenohumeral Joint: The patient has advanced glenohumeral osteoarthritis with bone-on-bone joint space narrowing and a large osteophyte off the humeral head. Mild subchondral edema is present about the joint. No notable subchondral cyst formation. Labrum:  The superior labrum is degenerated and torn. Bones:  No fracture or worrisome lesion. Other: None. IMPRESSION: 1. Rotator cuff and long head of biceps tendinopathy with a 1.5 cm from front to back near full-thickness tear of the anterior and far lateral supraspinatus. There is little tendon retraction and no muscle atrophy. 2. Advanced glenohumeral and acromioclavicular osteoarthritis. 3. Small volume of subacromial/subdeltoid fluid compatible with bursitis. Electronically Signed   By: Etheleen Her M.D.   On: 07/19/2023 09:01   DG Chest 2 View Result Date: 07/19/2023 CLINICAL DATA:  SOB chronic cough EXAM: CHEST - 2 VIEW COMPARISON:  04/05/2022. FINDINGS: The heart size and mediastinal contours are within normal limits. Lungs are hyperinflated suggesting COPD. There is no focal consolidation. No pneumothorax or pleural effusion. Left shoulder prosthesis. Thoracic degenerative  changes. IMPRESSION: Findings suggest COPD.  Otherwise no acute cardiopulmonary disease. Electronically Signed   By: Sydell Eva M.D.   On: 07/19/2023 03:49    Labs: BNP (last 3 results) No results for input(s): "BNP" in the last 8760 hours. Basic Metabolic Panel: Recent Labs  Lab 08/03/23 1421 08/03/23 1839 08/04/23 0447 08/05/23 0627  NA 132*  --  136 138  K 4.5  --  4.3 4.3  CL 102  --  104 104  CO2 20*  --  24 27  GLUCOSE 117*  --  98 99  BUN 54*  --  39* 22  CREATININE 3.44* 2.82* 2.11* 1.27*  CALCIUM  8.7*  --  8.6* 9.0  MG  --  2.6*  --   --    Liver Function Tests: Recent Labs  Lab 08/03/23 1421  AST 24  ALT 19  ALKPHOS 57  BILITOT 0.3  PROT 6.6  ALBUMIN 3.7   No results for input(s): "LIPASE", "AMYLASE" in the last 168 hours. No results for input(s): "AMMONIA" in the last 168 hours. CBC: Recent Labs  Lab 08/03/23 1421 08/04/23 0447 08/05/23 0627  WBC 8.5 5.2 4.9  NEUTROABS 6.8  --   --   HGB 13.9 13.0 12.8*  HCT 42.8 41.6 40.6  MCV 93.2 97.4 94.9  PLT 192 155 164   Recent Labs    08/03/23 1839  DDIMER 0.39   Recent Labs    08/04/23 0447  TSH 0.271*  Urinalysis    Component Value Date/Time   COLORURINE YELLOW 08/03/2023 1628   APPEARANCEUR CLEAR 08/03/2023 1628   LABSPEC 1.008 08/03/2023 1628   PHURINE 5.0 08/03/2023 1628   GLUCOSEU 50 (A) 08/03/2023 1628   HGBUR SMALL (A) 08/03/2023 1628   BILIRUBINUR NEGATIVE 08/03/2023 1628   KETONESUR NEGATIVE 08/03/2023 1628   PROTEINUR NEGATIVE 08/03/2023 1628   UROBILINOGEN 0.2 11/14/2011 0814   NITRITE NEGATIVE 08/03/2023 1628   LEUKOCYTESUR NEGATIVE 08/03/2023 1628   Sepsis Labs Recent Labs  Lab 08/03/23 1421 08/04/23 0447 08/05/23 0627  WBC 8.5 5.2 4.9   Microbiology Recent Results (from the past 240 hours)  Culture, blood (Routine X 2) w Reflex to ID Panel     Status: None (Preliminary result)   Collection Time: 08/03/23  6:39 PM   Specimen: BLOOD RIGHT HAND  Result Value  Ref Range Status   Specimen Description   Final    BLOOD RIGHT HAND Performed at [facility], [address] [place], [place] [address]    Special Requests   Final    BOTTLES DRAWN AEROBIC ONLY Blood Culture results may not be optimal due to an inadequate volume of blood received in culture bottles Performed at [facility], [address] [place], [place] [address]    Culture   Final    NO GROWTH 2 DAYS Performed at [facility], [address] [place], [place] [address]    Report Status PENDING  Incomplete  Culture, blood (Routine X 2) w Reflex to ID Panel     Status: None (Preliminary result)   Collection Time: 08/03/23  7:09 PM   Specimen: BLOOD RIGHT HAND  Result Value Ref Range Status   Specimen Description   Final    BLOOD RIGHT HAND Performed at [facility], [address] [address] [place], [place] [address]    Special Requests   Final    BOTTLES DRAWN AEROBIC ONLY Blood Culture results may not be optimal due to an inadequate volume of blood received in culture bottles Performed at [facility], [address], [place], [place] [address]    Culture   Final    NO GROWTH 2 DAYS Performed at [facility], [address] [place], [place] [address]    Report Status PENDING  Incomplete     Time coordinating discharge: 35 minutes  SIGNED: Lesa Rape, MD  [facility] 08/05/2023, 2:07 PM  If 7PM-7AM, please contact night-coverage [email or web address]

## 2023-08-08 LAB — CULTURE, BLOOD (ROUTINE X 2)
Culture: NO GROWTH
Culture: NO GROWTH

## 2023-12-06 ENCOUNTER — Encounter: Payer: Self-pay | Admitting: Physician Assistant

## 2023-12-06 ENCOUNTER — Ambulatory Visit: Admitting: Physician Assistant

## 2023-12-06 DIAGNOSIS — D170 Benign lipomatous neoplasm of skin and subcutaneous tissue of head, face and neck: Secondary | ICD-10-CM

## 2023-12-06 DIAGNOSIS — D229 Melanocytic nevi, unspecified: Secondary | ICD-10-CM

## 2023-12-06 DIAGNOSIS — L74519 Primary focal hyperhidrosis, unspecified: Secondary | ICD-10-CM | POA: Diagnosis not present

## 2023-12-06 DIAGNOSIS — D224 Melanocytic nevi of scalp and neck: Secondary | ICD-10-CM | POA: Diagnosis not present

## 2023-12-06 MED ORDER — GLYCOPYRROLATE 1 MG PO TABS: 1.0000 mg | ORAL_TABLET | ORAL | 1 refills | Status: DC

## 2023-12-06 MED ORDER — GLYCOPYRROLATE 1 MG PO TABS: ORAL_TABLET | ORAL | 1 refills | Status: DC

## 2023-12-06 NOTE — Progress Notes (Signed)
   New Patient Visit   Subjective  Raymond Graham is a 73 y.o. male NEW PATIENT who presents for the following: Pt states he has had a skin tag on the back of his neck for about 6 years. Pt states this skin tag has grown in size over the years. Pt states it is very irritating. Pt would like to get it removed.   Other concern: excessive sweating (forehead / scalp) for many many years.     The following portions of the chart were reviewed this encounter and updated as appropriate: medications, allergies, medical history  Review of Systems:  No other skin or systemic complaints except as noted in HPI or Assessment and Plan.  Objective  Well appearing patient in no apparent distress; mood and affect are within normal limits.   A focused examination was performed of the following areas: Neck  Relevant exam findings are noted in the Assessment and Plan.  Neck - Posterior 1.2 cm irritated nevus    Assessment & Plan   HYPERHIDROSIS - PATIENT REPORTS  - discussed condition and treatment options   Plan: Glycopyrrolate  Take one tablet daily for one week and after two weeks if no improvement take two tablets daily.       IRRITATED NEVUS Neck - Posterior Epidermal / dermal shaving - Neck - Posterior  Lesion diameter (cm):  1.2 Informed consent: discussed and consent obtained   Timeout: patient name, date of birth, surgical site, and procedure verified   Procedure prep:  Patient was prepped and draped in usual sterile fashion Prep type:  Isopropyl alcohol Instrument used: DermaBlade   Outcome: patient tolerated procedure well   Post-procedure details: wound care instructions given    Specimen 1 - Surgical pathology Differential Diagnosis: flesh colored nodule  Check Margins: No PRIMARY FOCAL HYPERHIDROSIS    Return in about 6 months (around 06/07/2024) for hyperhidrosis.  I, Doyce Pan, CMA, am acting as scribe for Lenward Able K, PA-C.   Documentation: I  have reviewed the above documentation for accuracy and completeness, and I agree with the above.  Marlena Barbato K, PA-C

## 2023-12-06 NOTE — Patient Instructions (Addendum)
 Patient Handout: Wound Care for Skin Biopsy Site  Taking Care of Your Skin Biopsy Site  Proper care of the biopsy site is essential for promoting healing and minimizing scarring. This handout provides instructions on how to care for your biopsy site to ensure optimal recovery.  1. Cleaning the Wound:  Clean the biopsy site daily with gentle soap and water . Gently pat the area dry with a clean, soft towel. Avoid harsh scrubbing or rubbing the area, as this can irritate the skin and delay healing.  2. Applying Aquaphor and Bandage:  After cleaning the wound, apply a thin layer of Aquaphor ointment to the biopsy site. Cover the area with a sterile bandage to protect it from dirt, bacteria, and friction. Change the bandage daily or as needed if it becomes soiled or wet.  3. Continued Care for One Week:  Repeat the cleaning, Aquaphor application, and bandaging process daily for one week following the biopsy procedure. Keeping the wound clean and moist during this initial healing period will help prevent infection and promote optimal healing.  4. Massaging Aquaphor into the Area:  ---After one week, discontinue the use of bandages but continue to apply Aquaphor to the biopsy site. ----Gently massage the Aquaphor into the area using circular motions. ---Massaging the skin helps to promote circulation and prevent the formation of scar tissue.   Additional Tips:  Avoid exposing the biopsy site to direct sunlight during the healing process, as this can cause hyperpigmentation or worsen scarring. If you experience any signs of infection, such as increased redness, swelling, warmth, or drainage from the wound, contact your healthcare provider immediately. Follow any additional instructions provided by your healthcare provider for caring for the biopsy site and managing any discomfort. Conclusion:  Taking proper care of your skin biopsy site is crucial for ensuring optimal healing and  minimizing scarring. By following these instructions for cleaning, applying Aquaphor, and massaging the area, you can promote a smooth and successful recovery. If you have any questions or concerns about caring for your biopsy site, don't hesitate to contact your healthcare provider for guidance.     Additional Tips:  Avoid exposing the biopsy site to direct sunlight during the healing process, as this can cause hyperpigmentation or worsen scarring. If you experience any signs of infection, such as increased redness, swelling, warmth, or drainage from the wound, contact your healthcare provider immediately. Follow any additional instructions provided by your healthcare provider for caring for the biopsy site and managing any discomfort. Conclusion:  Taking proper care of your skin biopsy site is crucial for ensuring optimal healing and minimizing scarring. By following these instructions for cleaning, applying Aquaphor, and massaging the area, you can promote a smooth and successful recovery. If you have any questions or concerns about caring for your biopsy site, don't hesitate to contact your healthcare provider for guidance.   Important Information  Due to recent changes in healthcare laws, you may see results of your pathology and/or laboratory studies on MyChart before the doctors have had a chance to review them. We understand that in some cases there may be results that are confusing or concerning to you. Please understand that not all results are received at the same time and often the doctors may need to interpret multiple results in order to provide you with the best plan of care or course of treatment. Therefore, we ask that you please give us  2 business days to thoroughly review all your results before contacting the  office for clarification. Should we see a critical lab result, you will be contacted sooner.   If You Need Anything After Your Visit  If you have any questions or concerns  for your doctor, please call our main line at [phone] If no one answers, please leave a voicemail as directed and we will return your call as soon as possible. Messages left after 4 pm will be answered the following business day.   You may also send us  a message via MyChart. We typically respond to MyChart messages within 1-2 business days.  For prescription refills, please ask your pharmacy to contact our office. Our fax number is [phone].  If you have an urgent issue when the clinic is closed that cannot wait until the next business day, you can page your doctor at the number below.    Please note that while we do our best to be available for urgent issues outside of office hours, we are not available 24/7.   If you have an urgent issue and are unable to reach us , you may choose to seek medical care at your doctor's office, retail clinic, urgent care center, or emergency room.  If you have a medical emergency, please immediately call 911 or go to the emergency department. In the event of inclement weather, please call our main line at [phone] for an update on the status of any delays or closures.  Dermatology Medication Tips: Please keep the boxes that topical medications come in in order to help keep track of the instructions about where and how to use these. Pharmacies typically print the medication instructions only on the boxes and not directly on the medication tubes.   If your medication is too expensive, please contact our office at [phone] or send us  a message through MyChart.   We are unable to tell what your co-pay for medications will be in advance as this is different depending on your insurance coverage. However, we may be able to find a substitute medication at lower cost or fill out paperwork to get insurance to cover a needed medication.   If a prior authorization is required to get your medication covered by your insurance company, please allow us  1-2  business days to complete this process.  Drug prices often vary depending on where the prescription is filled and some pharmacies may offer cheaper prices.  The website www.goodrx.com contains coupons for medications through different pharmacies. The prices here do not account for what the cost may be with help from insurance (it may be cheaper with your insurance), but the website can give you the price if you did not use any insurance.  - You can print the associated coupon and take it with your prescription to the pharmacy.  - You may also stop by our office during regular business hours and pick up a GoodRx coupon card.  - If you need your prescription sent electronically to a different pharmacy, notify our office through [facility] or by phone at [phone]

## 2023-12-07 LAB — SURGICAL PATHOLOGY

## 2023-12-11 ENCOUNTER — Ambulatory Visit: Payer: Self-pay | Admitting: Physician Assistant

## 2024-01-04 ENCOUNTER — Telehealth: Payer: Self-pay

## 2024-01-04 NOTE — Telephone Encounter (Signed)
 Glycopyrrolate  is not covered by patient's insurance. Is there another treatment that would be covered?

## 2024-01-08 ENCOUNTER — Other Ambulatory Visit: Payer: Self-pay

## 2024-01-08 MED ORDER — GLYCOPYRROLATE 1 MG PO TABS: ORAL_TABLET | ORAL | 6 refills | Status: AC

## 2024-01-24 ENCOUNTER — Other Ambulatory Visit: Payer: Self-pay | Admitting: Orthopaedic Surgery

## 2024-01-24 DIAGNOSIS — M19011 Primary osteoarthritis, right shoulder: Secondary | ICD-10-CM

## 2024-01-30 ENCOUNTER — Ambulatory Visit
Admission: RE | Admit: 2024-01-30 | Discharge: 2024-01-30 | Disposition: A | Discharge status: Home / Self Care | Source: Ambulatory Visit | Attending: Orthopaedic Surgery | Admitting: Orthopaedic Surgery

## 2024-01-30 DIAGNOSIS — M19011 Primary osteoarthritis, right shoulder: Secondary | ICD-10-CM

## 2024-03-13 ENCOUNTER — Other Ambulatory Visit: Payer: Self-pay | Admitting: Orthopaedic Surgery

## 2024-03-13 DIAGNOSIS — M19011 Primary osteoarthritis, right shoulder: Secondary | ICD-10-CM

## 2024-03-19 ENCOUNTER — Other Ambulatory Visit

## 2024-03-20 ENCOUNTER — Inpatient Hospital Stay: Admission: RE | Admit: 2024-03-20 | Discharge: 2024-03-20 | Attending: Orthopaedic Surgery

## 2024-03-20 DIAGNOSIS — M19011 Primary osteoarthritis, right shoulder: Secondary | ICD-10-CM

## 2024-05-07 ENCOUNTER — Other Ambulatory Visit (HOSPITAL_COMMUNITY): Payer: Self-pay

## 2024-05-13 ENCOUNTER — Other Ambulatory Visit (HOSPITAL_COMMUNITY): Payer: Self-pay

## 2024-05-13 ENCOUNTER — Telehealth: Payer: Self-pay

## 2024-05-13 NOTE — Telephone Encounter (Signed)
 Pharmacy Patient Advocate Encounter  Received notification from [employer] that Prior Authorization for Tadalafil has been APPROVED from 05/13/2024 to 04/10/2025. Ran test claim, Copay is $4.18. This test claim was processed through [facility]- copay amounts may vary at other pharmacies due to pharmacy/plan contracts, or as the patient moves through the different stages of their insurance plan.   PA #/Case ID/Reference #: [id number]

## 2024-05-13 NOTE — Telephone Encounter (Signed)
 Pharmacy Patient Advocate Encounter   Received notification from [facility] that prior authorization for Tadalafil is required/requested.   Insurance verification completed.   The patient is insured through [facility].   Per test claim: PA required; PA submitted to above mentioned insurance via Latent Key/confirmation #/[id number] Status is pending

## 2024-06-12 ENCOUNTER — Ambulatory Visit: Admitting: Physician Assistant
